# Patient Record
Sex: Male | Born: 1955
Health system: Southern US, Community
[De-identification: ages and names within clinical notes are randomized; demographics above are authoritative.]

## PROBLEM LIST (undated history)

## (undated) DIAGNOSIS — M1990 Unspecified osteoarthritis, unspecified site: Secondary | ICD-10-CM

## (undated) DIAGNOSIS — E785 Hyperlipidemia, unspecified: Secondary | ICD-10-CM

## (undated) DIAGNOSIS — Z87442 Personal history of urinary calculi: Secondary | ICD-10-CM

## (undated) DIAGNOSIS — I10 Essential (primary) hypertension: Secondary | ICD-10-CM

## (undated) DIAGNOSIS — N200 Calculus of kidney: Secondary | ICD-10-CM

## (undated) DIAGNOSIS — K219 Gastro-esophageal reflux disease without esophagitis: Secondary | ICD-10-CM

## (undated) HISTORY — PX: BACK SURGERY: SHX140

## (undated) HISTORY — PX: SHOULDER SURGERY: SHX246

## (undated) HISTORY — DX: Hyperlipidemia, unspecified: E78.5

## (undated) HISTORY — PX: JOINT REPLACEMENT: SHX530

## (undated) HISTORY — PX: SPINE SURGERY: SHX786

---

## 1982-09-05 HISTORY — PX: LAMINECTOMY: SHX219

## 1994-09-05 HISTORY — PX: LAMINECTOMY: SHX219

## 1998-10-22 ENCOUNTER — Ambulatory Visit (HOSPITAL_COMMUNITY): Admission: RE | Admit: 1998-10-22 | Discharge: 1998-10-22 | Payer: Self-pay | Admitting: Orthopedic Surgery

## 1998-10-22 ENCOUNTER — Encounter: Payer: Self-pay | Admitting: Orthopedic Surgery

## 1999-09-30 ENCOUNTER — Encounter: Payer: Self-pay | Admitting: Orthopedic Surgery

## 1999-09-30 ENCOUNTER — Ambulatory Visit (HOSPITAL_COMMUNITY): Admission: RE | Admit: 1999-09-30 | Discharge: 1999-09-30 | Payer: Self-pay | Admitting: Orthopedic Surgery

## 2001-06-19 ENCOUNTER — Encounter: Payer: Self-pay | Admitting: Sports Medicine

## 2001-06-19 ENCOUNTER — Encounter: Admission: RE | Admit: 2001-06-19 | Discharge: 2001-06-19 | Payer: Self-pay | Admitting: Sports Medicine

## 2001-12-31 ENCOUNTER — Encounter: Payer: Self-pay | Admitting: Sports Medicine

## 2001-12-31 ENCOUNTER — Encounter: Admission: RE | Admit: 2001-12-31 | Discharge: 2001-12-31 | Payer: Self-pay | Admitting: Sports Medicine

## 2002-01-10 ENCOUNTER — Encounter: Payer: Self-pay | Admitting: Sports Medicine

## 2002-01-10 ENCOUNTER — Encounter: Admission: RE | Admit: 2002-01-10 | Discharge: 2002-01-10 | Payer: Self-pay | Admitting: Sports Medicine

## 2002-03-19 ENCOUNTER — Encounter: Admission: RE | Admit: 2002-03-19 | Discharge: 2002-03-19 | Payer: Self-pay | Admitting: Neurosurgery

## 2002-03-19 ENCOUNTER — Encounter: Payer: Self-pay | Admitting: Neurosurgery

## 2002-10-24 ENCOUNTER — Encounter: Payer: Self-pay | Admitting: Orthopedic Surgery

## 2002-10-24 ENCOUNTER — Encounter: Admission: RE | Admit: 2002-10-24 | Discharge: 2002-10-24 | Payer: Self-pay | Admitting: Orthopedic Surgery

## 2006-09-28 ENCOUNTER — Inpatient Hospital Stay (HOSPITAL_COMMUNITY): Admission: RE | Admit: 2006-09-28 | Discharge: 2006-10-01 | Payer: Self-pay | Admitting: Orthopedic Surgery

## 2006-10-12 ENCOUNTER — Inpatient Hospital Stay (HOSPITAL_COMMUNITY): Admission: EM | Admit: 2006-10-12 | Discharge: 2006-10-13 | Payer: Self-pay | Admitting: Emergency Medicine

## 2006-10-12 ENCOUNTER — Ambulatory Visit: Payer: Self-pay | Admitting: Internal Medicine

## 2009-02-12 ENCOUNTER — Ambulatory Visit (HOSPITAL_COMMUNITY): Admission: RE | Admit: 2009-02-12 | Discharge: 2009-02-12 | Payer: Self-pay | Admitting: Urology

## 2009-06-03 ENCOUNTER — Encounter: Admission: RE | Admit: 2009-06-03 | Discharge: 2009-06-03 | Payer: Self-pay | Admitting: Neurosurgery

## 2011-01-21 NOTE — Discharge Summary (Signed)
NAME:  Kyle Lawson, NATZKE NO.:  [id number]   MEDICAL RECORD NO.:  [id number]          PATIENT TYPE:  INP   LOCATION:  5016                         FACILITY:  [facility]   PHYSICIAN:  Loreta Ave, M.D. DATE OF BIRTH:  1956/02/15   DATE OF ADMISSION:  09/28/2006  DATE OF DISCHARGE:  10/01/2006                               DISCHARGE SUMMARY   FINAL DIAGNOSES:  1. Status post left total knee hip replacement for end-stage      degenerative joint disease.  2. Hypertension.  3. Gout.   HISTORY OF PRESENT ILLNESS:  A 55 year old white male with a history of  end-stage degenerative joint disease left hip and chronic pain,  presented to our office for a preop evaluation for a total hip  replacement.  He had progressive worsening pain, but failed conservative  treatment.  Significant decrease in his daily activities.   HOSPITAL COURSE:  September 28, 2006, patient was taken to the [facility]  OR and a left total knee replacement procedure performed.  Surgeon,  Loreta Ave, MD, assistant, Genene Churn. Barry Dienes, PA-C.  Anesthesia  general.  No specimens.  EBL minimal.  There were no surgical or  anesthesia complications and the patient was transferred to recovery in  a stable condition.  September 28, 2006, patient doing well.  Complained  of a right great toe pain from a gouty attack.  Temp 101.5, pulse 95,  respirations 18, blood pressure 94/59.  WBC 8.8, hemoglobin 11.4,  hematocrit 31.9, platelets 229, sodium 133, potassium 4.0, chloride 99,  CO2 28, BUN 14, creatinine 1.3, glucose 110, INR 1.4.  Dressing clean,  dry and intact.  Calf nontender and neurovascular intact.  Right great  toe marked tenderness to palpation with some swelling.  Discontinued  Foley.  Started on pharmacy protocol Coumadin.  Evaluated by PT/OT.  September 30, 2006, patient doing well.  Temp 99, pulse 98, respirations  20, blood pressure 114/65.  Hemoglobin 10.9, INR 1.4.  Wound looked  good, staples  intact.  No drainage or signs of infection.  October 01, 2006, patient doing well.  Right great toe feels better.  Vital signs  stable, afebrile.  Hemoglobin 10.5, hematocrit 30.4, WBC 9.3, platelets  215, sodium 134, potassium 4.4, chloride 102, CO2 26, BUN 17, creatinine  1.07, glucose 93.  Wound looked good, staples intact.  No drainage or  signs of infection.  Calf nontender and neurovascular intact.  Patient  states that he is ready to be discharged home.   CONDITION:  Good and stable.   DISPOSITION:  Discharge home.   DISCHARGE MEDICATIONS:  1. Percocet 7.5/325 one or two tabs p.o. q.4-6 hours p.r.n. for pain.  2. Robaxin 500 mg one tab p.o. q.6 hours p.r.n. for spasms.  3. Coumadin pharmacy protocol.  4. Resume previous home meds lisinopril and allopurinol.   INSTRUCTIONS:  Patient will work with home health PT/OT to improve range  of motion, strengthening and ambulation.  He is 50% partial  weightbearing left lower extremity.  Will follow up in the office two  weeks postop  for recheck.  Coumadin x4 weeks postoperative DVT  prophylaxis.      Genene Churn. Denton Meek.      Loreta Ave, M.D.  Electronically Signed    JMO/MEDQ  D:  02/05/2007  T:  02/05/2007  Job:  [id number]

## 2011-01-21 NOTE — Procedures (Signed)
EEG NUMBER:  [id number].   HISTORY OF PRESENT ILLNESS:  This is a 55 year old with blurry vision,  who is having an EEG done to evaluate for seizure activity.   PROCEDURE:  Routine EEG.   TECHNICAL DESCRIPTION:  Test is a routine EEG. There is a posterior  dominant rhythm at 9-10 Hz activity at 10 to 20 microvolts. Background  activity is symmetric and mostly comprised of slpha activity at 10 to 25  microvolts. With photic stimulation, there is a symmetric driving  response noted. Hyperventilation produces a symmetric, slowing response.  The patient does become drowsy; however, does not enter stage II sleep  throughout this record.  There is no evidence of electrocardiographic  seizures or interictal discharge activity.   IMPRESSION:  This routine electroencephalogram is within normal limits  in the awake state.      Bevelyn Buckles. Nash Shearer, M.D.  Electronically Signed     ZOX:WRUE  D:  10/13/2006 18:37:36  T:  10/14/2006 10:21:43  Job #:  [id number]

## 2011-01-21 NOTE — Op Note (Signed)
NAME:  Kyle Lawson, KLINGBEIL NO.:  [id number]   MEDICAL RECORD NO.:  [id number]          PATIENT TYPE:  INP   LOCATION:  2550                         FACILITY:  [facility]   PHYSICIAN:  Loreta Ave, M.D. DATE OF BIRTH:  19-Jan-1956   DATE OF PROCEDURE:  09/28/2006  DATE OF DISCHARGE:                               OPERATIVE REPORT   PREOPERATIVE DIAGNOSIS:  End-stage degenerative arthritis, left hip.   POSTOPERATIVE DIAGNOSIS:  End-stage degenerative arthritis, left hip.   PROCEDURE:  Left total hip replacement.  Stryker prosthesis.   FEMORAL COMPONENT:  Accolade, size 4.5, press-fit.  A 58-mm acetabular  component with 36-mm internal diameter ceramic liner.  Screw fixation  x2.  A 36-mm +0 ceramic femoral head.   SURGEON:  Loreta Ave, M.D.   ASSISTANT:  Genene Churn. Denton Meek.   ANESTHESIA:  General.   BLOOD LOSS:  200 mL.   BLOOD GIVEN:  None.   SPECIMENS:  None.   CULTURES:  None.   COMPLICATIONS:  None.   DRESSINGS:  Soft compressive with abduction pillow.   PROCEDURE:  The patient brought to the operating room and placed on the  operating room table in the supine position.  After adequate anesthesia  had been obtained, turned to the lateral position, left side up.  Prepped and draped in the usual sterile fashion.  Incision along the  shaft of the femur to the trochanter, extending posterior and superior.  Skin and subcutaneous tissue divided.  The iliotibial band exposed,  incised.  Charnley retractor put in place.  Neurovascular structures  identified and protected.  External rotator capsule taken down off the  back of the intertrochanteric groove on the femur, tied with FiberWire.  The hip dislocated.  Marked grade 4 change throughout.  Periarticular  screw was removed.  Femoral head removed one fingerbreadth above the  lesser trochanter with a saw.  Acetabulum exposed.  Remnants of labrum  removed.  Reaming up to good position and bleeding  bone.  Sized for a 58-  mm cup, which was placed at 45 degrees of abduction, 20 degrees of  anteversion.  Excellent capture and fixation augmented with 2 screws  through the cup.  A 36-mm internal diameter ceramic liner.  The femur  was then exposed and with the handheld reamers and rasps, enlarged up to  a size 4.5 component, which gave good capture and alignment, fixation,  and filling, restoring normal anteversion.  After appropriate trialing,  4.5 was seated in the femur.  The +0 x 36-mm head attached.  Hip  reduced.  Excellent restoration of leg lengths.  Good alignment.  Good  stability in flexion and extension.  Wound irrigated.  External rotator  and capsule were repaired through drill holes.  Suture tied over a bony  bridge.  Charnley retractor removed.  The wound was closed with #1  Vicryl, and skin and subcutaneous tissue with Vicryl and staples.  Sterile compression dressing applied after Marcaine injection of the  wound.  Returned to the supine position.  Abduction pillow placed.  Anesthesia reversed.  Brought to  recovery room.  Tolerated surgery well  with no complications.      Loreta Ave, M.D.  Electronically Signed     DFM/MEDQ  D:  09/28/2006  T:  09/28/2006  Job:  [id number]

## 2011-01-21 NOTE — H&P (Signed)
NAME:  MELECIO, CUETO NO.:  [id number]   MEDICAL RECORD NO.:  [id number]          PATIENT TYPE:  INP   LOCATION:  6733                         FACILITY:  [facility]   PHYSICIAN:  Genene Churn. Love, M.D.    DATE OF BIRTH:  1956/06/16   DATE OF ADMISSION:  10/12/2006  DATE OF DISCHARGE:                              HISTORY & PHYSICAL   PATIENT'S ADDRESS:  [address]  [place], [place] [address]   RADIOLOGIC FINDINGS:  This is the 2nd [facility] admission for  this 55 year old right-handed white married male admitted for 3 episodes  of blurred vision.   HISTORY OF PRESENT ILLNESS:  Mr. Sciandra has a 15-year history of  hypertension and 2 weeks ago, underwent total left hip replacement for  degenerative disease of the hip and has been followed by Dr. Richardson Landry  and underwent surgery by Dr. Richardson Landry.  The surgery went well on  September 28, 2006 and he was placed on Coumadin therapy thereafter.  Yesterday, he had 2 episodes lasting 5 minutes of wavy vision in his  right visual field, unassociated with definite headache.  He had a  similar episode today, called Dr. Greig Right office and was sent to the  emergency room for further evaluation.  He has a long history of  hypertension for about 15 years.  He has no known history of migraine.  He has no known history of seizures.  CT scan of the brain in the  emergency room and EKG were normal.   PAST MEDICAL HISTORY:  1. Left hip replacement, September 28, 2006, for DJD.  2. Lumbar spine surgery x2, once in 1984 and once again in 1996.  3. Hypertension for 15 years.  4. Gout.   SOCIAL HISTORY:  He does not smoke cigarettes.  He does drink 1 drink of  alcohol per day.  He has a Ph.D. Animal nutritionist.  He is married.   FAMILY HISTORY:  His mother is 84, living and well.  His father is 60,  living and well.  He has sisters, 49 and 38, living and well.  He has  brothers, 92, 81 and 45, living and well.  One brother  committed suicide  at age 65.  He has a brother, 44, with diabetes mellitus.   PHYSICAL EXAMINATION:  GENERAL:  A well-developed, pleasant white male.  VITAL SIGNS:  Blood pressure in right and left arm 120/60.  Heart rate  was 84.  NECK:  There were no bruits.  NEUROLOGIC:  He was alert and oriented x3.  His cranial nerve  examination revealed visual fields full, disks flat, extraocular  movements full, corneals present, no 7th nerve palsy, tongue midline,  uvula midline, gags present, sternocleidomastoid and trapezius testing  normal.  Motor examination revealed 5/5 strength proximally and distally  in the upper and lower extremities, no evidence of proximal pronator or  distal drift.  Coordination testing normal.  Sensory examination intact  to pinprick, light touch, joint position and vibration testing.  Deep  tendon reflexes were 1 to 2+.  Plantar responses downgoing.  Gait  examination not performed.  LUNGS:  Clear.  EARS:  Tympanic membranes were clear.  HEART:  Without murmurs.  ABDOMEN:  Bowel sounds were normal.   IMPRESSION:  1. Blurred vision, rule out left parietal stroke, code 368.8.  2. Hypertension, code 796.2.  3. Low back pain, code 724.4.  4. Status post left hip replacement, code 719.45.   PLAN:  To admit the patient for MRI, MRA, intracranial, 2-D echo and  Doppler studies.           ______________________________  Genene Churn. Sandria Manly, M.D.     JML/MEDQ  D:  10/12/2006  T:  10/13/2006  Job:  [id number]   cc:   Loreta Ave, M.D.

## 2011-01-21 NOTE — Discharge Summary (Signed)
NAME:  Kyle Lawson, Kyle Lawson NO.:  [id number]   MEDICAL RECORD NO.:  [id number]          PATIENT TYPE:  INP   LOCATION:  6733                         FACILITY:  [facility]   PHYSICIAN:  Genene Churn. Love, M.D.    DATE OF BIRTH:  15-Jun-1956   DATE OF ADMISSION:  10/12/2006  DATE OF DISCHARGE:  10/13/2006                               DISCHARGE SUMMARY   DIAGNOSES AT TIME OF DISCHARGE:  1. Blurred vision, etiology uncertain, no acute stroke.  2. Hypertension.  3. Low back pain, status post left hip replacement 2 weeks ago.   MEDICINES AT TIME OF DISCHARGE:  1. Lisinopril 40 mg a day.  2. Allopurinol 300 mg a day.  3. Coumadin 7.5 mg a day.  4. Hydrocodone 10/325 mg p.o. q.4-6h. p.r.n.   STUDIES PERFORMED:  1. CT of the brain on admission shows no acute abnormality.  2. MRI shows no acute stroke.  3. MRA shows no significant stenosis.  4. Carotid Doppler shows no ICA stenosis or __________  flow      antegrade.  5. 2D Echocardiogram was performed, results pending.  6. EEG was performed, results pending.   LABORATORY STUDIES:  CBC with hemoglobin 12.5, hematocrit 36.6, white  blood cell 10.6, platelets 863, MCV 95.5 and neutrophils 77.  Chemistry  with glucose 101, otherwise normal.  Coagulations with INR 1.9, PTT 41,  PT 22.3.  AST 43, ALT 89, alkaline phosphatase 104 and total bilirubin  0.4.  Cardiac enzymes negative.  Cholesterol 173, HDL 23, triglycerides  401.  LDL unable to be calculated.  Urinalysis was negative.  Homocysteine pending.  Urine drug screen positive for opiates.  Alcohol  less than 5.  Hemoglobin A1c 5.5.   HISTORY OF PRESENT ILLNESS:  [name] is a 55 year old, right-  handed, Caucasian male who was admitted for 3 episodes of blurred  vision.  He has a 15-year history of hypertension and 2 weeks ago  underwent total left hip replacement for degenerative disease of the hip  and has been followed by Dr. Richardson Landry.  The surgery went well and  on  September 28, 2006 he was placed on Coumadin therapy, thereafter.  The day  prior to admission he had 2 episodes lasting 5 minutes of wavy vision in  his right visual field and associated with a definite headache.  He had  a similar episode the day of admission, called Dr. Greig Right office, and  was sent to the emergency room for further evaluation.  He has no known  history of migraine or seizures.  CT of the brain showed no acute  abnormality.  EKG was normal.  He was admitted to the hospital for  further circ evaluation.   HOSPITAL COURSE:  MRI was negative for acute stroke.  He had vascular  risk factors of hypertension and recent surgery, though he is  therapeutic on Coumadin. The patient had no further neurologic deficits  in hospital and was back to baseline.  He has an appointment for  ophthalmology follow up at the time of discharge which is recommended by  Dr. Sandria Manly.  He will continue on Coumadin and follow up with Dr. Eulah Pont as  prior to admission for Coumadin management.   CONDITION ON DISCHARGE:  Patient alert and oriented x3.  Speech clear.  __________  Strength normal except for his left hip which has had recent  surgery and he walks with a walker which is very steady.  His visual  fields are full.  His extraocular movements are intact.  Face is  symmetric.   DISCHARGE PLAN:  1. Discharge home with family.  2. Continue treatment as prior to admission.  3. Follow up with ophthalmologist as scheduled.  4. Follow up with Dr. Richardson Landry for Coumadin and orthopedic follow      up.  5. Follow up with Dr. Sandria Manly in 1-2 months as a hospital follow up.      Annie Main, N.P.    ______________________________  Genene Churn. Sandria Manly, M.D.    SB/MEDQ  D:  10/13/2006  T:  10/14/2006  Job:  [id number]   cc:   Loreta Ave, M.D.  [employer]  Pramod P. Pearlean Brownie, MD

## 2011-09-09 NOTE — Pre-Procedure Instructions (Signed)
20 Kyle Lawson  09/09/2011   Your procedure is scheduled on:  Wednesday, January 9 th  Report to [facility] at  6:30 AM.  Call this number if you have problems the morning of surgery: [phone]   Remember: Bring Incentive Spirometer back in suitcase.   Do not eat food:After Midnight  Tuesday.  May have clear liquids: up to 4 Hours before arrival time  (2:30 AM).  Clear liquids include soda, tea, black coffee, apple or grape juice, broth.   Take these medicines the morning of surgery with A SIP OF WATER: Colchicine, Allopurinol, Vicodin   Do not wear jewelry, make-up or nail polish.  Do not wear lotions, powders, or perfumes. You may wear deodorant.  Do not shave 48 hours prior to surgery.   Do not bring valuables to the hospital.   Contacts, dentures or bridgework may not be worn into surgery.  Leave suitcase in the car. After surgery it may be brought to your room.  For patients admitted to the hospital, checkout time is 11:00 AM the day of discharge.   Patients discharged the day of surgery will not be allowed to drive home.  Name and phone number of your driver: Patient will be admitted.  Special Instructions: CHG Shower Use Special Wash: 1/2 bottle night before surgery and 1/2 bottle morning of surgery.   Please read over the following fact sheets that you were given: Pain Booklet, MRSA Information and Surgical Site Infection Prevention

## 2011-09-12 ENCOUNTER — Encounter (HOSPITAL_COMMUNITY)
Admission: RE | Admit: 2011-09-12 | Discharge: 2011-09-12 | Disposition: A | Discharge status: Home / Self Care | Payer: BC Managed Care – PPO | Source: Ambulatory Visit | Attending: Orthopedic Surgery | Admitting: Orthopedic Surgery

## 2011-09-12 ENCOUNTER — Encounter (HOSPITAL_COMMUNITY)
Admission: RE | Admit: 2011-09-12 | Discharge: 2011-09-12 | Disposition: A | Discharge status: Home / Self Care | Payer: BC Managed Care – PPO | Source: Ambulatory Visit | Attending: Surgery | Admitting: Surgery

## 2011-09-12 ENCOUNTER — Other Ambulatory Visit: Payer: Self-pay

## 2011-09-12 ENCOUNTER — Encounter (HOSPITAL_COMMUNITY): Payer: Self-pay | Admitting: Pharmacy Technician

## 2011-09-12 ENCOUNTER — Encounter (HOSPITAL_COMMUNITY): Payer: Self-pay

## 2011-09-12 HISTORY — DX: Calculus of kidney: N20.0

## 2011-09-12 HISTORY — DX: Essential (primary) hypertension: I10

## 2011-09-12 HISTORY — DX: Gastro-esophageal reflux disease without esophagitis: K21.9

## 2011-09-12 LAB — PROTIME-INR: INR: 0.97 (ref 0.00–1.49)

## 2011-09-12 LAB — COMPREHENSIVE METABOLIC PANEL
AST: 20 U/L (ref 0–37)
Albumin: 3.8 g/dL (ref 3.5–5.2)
Calcium: 9.6 mg/dL (ref 8.4–10.5)
Creatinine, Ser: 0.97 mg/dL (ref 0.50–1.35)
GFR calc non Af Amer: >90 mL/min (ref >90)

## 2011-09-12 LAB — CBC
MCH: 33 pg (ref 26.0–34.0)
MCV: 96.9 fL (ref 78.0–100.0)
Platelets: 258 10*3/uL (ref 150–400)
RDW: 12 % (ref 11.5–15.5)

## 2011-09-12 LAB — URINALYSIS, ROUTINE W REFLEX MICROSCOPIC
Ketones, ur: NEGATIVE mg/dL
Leukocytes, UA: NEGATIVE
Nitrite: NEGATIVE
Specific Gravity, Urine: 1.022 (ref 1.005–1.030)
Urobilinogen, UA: 0.2 mg/dL (ref 0.0–1.0)
pH: 5.5 (ref 5.0–8.0)

## 2011-09-12 LAB — TYPE AND SCREEN

## 2011-09-12 LAB — SURGICAL PCR SCREEN
MRSA, PCR: NEGATIVE
Staphylococcus aureus: POSITIVE — AB

## 2011-09-12 MED ORDER — CHLORHEXIDINE GLUCONATE 4 % EX LIQD: 60.0000 mL | Freq: Once | CUTANEOUS | Status: DC

## 2011-09-12 NOTE — Pre-Procedure Instructions (Signed)
20 Kyle Lawson  09/12/2011   Your procedure is scheduled on:  09/14/2011  Report to [facility] at 0630 AM.  Call this number if you have problems the morning of surgery: [phone]   Remember:   Do not eat food:After Midnight.  May have clear liquids: up to 4 Hours before arrival.  Clear liquids include soda, tea, black coffee, apple or grape juice, broth.  Take these medicines the morning of surgery with A SIP OF WATER: Allopurinol, Colchicine, Vicodin   Do not wear jewelry, make-up or nail polish.  Do not wear lotions, powders, or perfumes. You may wear deodorant.  Do not shave 48 hours prior to surgery.  Do not bring valuables to the hospital.  Contacts, dentures or bridgework may not be worn into surgery.  Leave suitcase in the car. After surgery it may be brought to your room.  For patients admitted to the hospital, checkout time is 11:00 AM the day of discharge.   Patients discharged the day of surgery will not be allowed to drive home.  Name and phone number of your driver: Patient  Special Instructions: CHG Shower Use Special Wash: 1/2 bottle night before surgery and 1/2 bottle morning of surgery.   Please read over the following fact sheets that you were given: Pain Booklet, Coughing and Deep Breathing, Blood Transfusion Information, MRSA Information and Surgical Site Infection Prevention

## 2011-09-13 MED ORDER — CEFAZOLIN SODIUM-DEXTROSE 2-3 GM-% IV SOLR
2.0000 g | INTRAVENOUS | Status: AC
  Administered 2011-09-14: 2 g via INTRAVENOUS
  Filled 2011-09-13: qty 50

## 2011-09-13 NOTE — H&P (Signed)
  [facility] [address]   [address] [place], [place] [address] [phone] A Division of [facility]  Loreta Ave, M.D.     Robert A. Thurston Hole, M.D.     Lunette Stands, M.D. Eulas Post, M.D.    Buford Dresser, M.D. Estell Harpin, M.D. Ralene Cork, D.O.          Genene Churn. Barry Dienes, PA-C            Kirstin A. Shepperson, PA-C [place], OPA-C   RE: Kyle Lawson, Kyle Lawson                                [id number]      DOB: [birth year] PROGRESS NOTE: 09-09-11 Chief complaint: Right hip pain.  History of present illness: 68 five year-old white male with a history of end stage DJD, right hip, and chronic pain.  Returns.  States that hip symptoms unchanged from previous visit.  He is wanting to proceed with total hip replacement as scheduled.  Current medications: Lisinopril, Allopurinol, Colchicine and Simvastatin. Allergies: No known drug allergies. Past medical/surgical history: Left total hip replacement in 2008, lumbar laminectomy x 2 in 1984 and 1996, hypertension, gout and hypercholesterolemia.  Review of systems: Patient denies fevers, chills, lightheadedness, dizziness, chest pain, shortness of breath, abdominal pain or GU issues.   Family history: Positive for diabetes, hypertension, stroke, cancer and coronary artery disease. Social history: Denies smoking, admits occasional alcohol use.  He is married.      EXAMINATION: Temperature: 98.3  Pulse: 74.  Blood pressure: 132/85.  Respirations: 12 and unlabored.  Height: 5?9.  Weight: 194 pounds.  Pleasant white male, alert and oriented x 3 and in no acute distress.  Head is normocephalic, a traumatic.  PERRLA, EOMI.  Cervical spine unremarkable.  Lungs: CTA bilaterally.  No wheezes.  Heart: RRR.  S1 and S2.  No murmurs.  Abdomen: Round and non-distended.  NBS x 4.  Soft and non-tender.  Right hip: Positive log roll with decreased range of motion.  Neurovascularly  intact.  Skin warm and dry.   IMPRESSION: End stage DJD, right hip, and chronic pain.  Failed conservative treatment.   PLAN:  We will proceed with right total hip replacement as scheduled.  Surgical procedure, along with potential rehab/recovery time discussed.  All questions answered.    Loreta Ave, M.D.   Electronically verified by Loreta Ave, M.D. DFM(JMO):jjh D 09-12-11 T 09-12-11

## 2011-09-14 ENCOUNTER — Ambulatory Visit (HOSPITAL_COMMUNITY): Payer: BC Managed Care – PPO | Admitting: Anesthesiology

## 2011-09-14 ENCOUNTER — Encounter (HOSPITAL_COMMUNITY)
Admission: RE | Disposition: A | Discharge status: Home / Self Care | Payer: Self-pay | Source: Ambulatory Visit | Attending: Orthopedic Surgery

## 2011-09-14 ENCOUNTER — Encounter (HOSPITAL_COMMUNITY): Payer: Self-pay | Admitting: Anesthesiology

## 2011-09-14 ENCOUNTER — Encounter (HOSPITAL_COMMUNITY): Payer: Self-pay

## 2011-09-14 ENCOUNTER — Encounter (HOSPITAL_COMMUNITY): Payer: Self-pay | Admitting: *Deleted

## 2011-09-14 ENCOUNTER — Inpatient Hospital Stay (HOSPITAL_COMMUNITY)
Admission: RE | Admit: 2011-09-14 | Discharge: 2011-09-16 | DRG: 818 | Disposition: A | Discharge status: Home / Self Care | Payer: BC Managed Care – PPO | Source: Ambulatory Visit | Attending: Orthopedic Surgery | Admitting: Orthopedic Surgery

## 2011-09-14 DIAGNOSIS — M169 Osteoarthritis of hip, unspecified: Principal | ICD-10-CM | POA: Diagnosis present

## 2011-09-14 DIAGNOSIS — E871 Hypo-osmolality and hyponatremia: Secondary | ICD-10-CM | POA: Diagnosis not present

## 2011-09-14 DIAGNOSIS — E7800 Pure hypercholesterolemia, unspecified: Secondary | ICD-10-CM | POA: Diagnosis present

## 2011-09-14 DIAGNOSIS — M129 Arthropathy, unspecified: Secondary | ICD-10-CM | POA: Diagnosis present

## 2011-09-14 DIAGNOSIS — Z96649 Presence of unspecified artificial hip joint: Secondary | ICD-10-CM

## 2011-09-14 DIAGNOSIS — I10 Essential (primary) hypertension: Secondary | ICD-10-CM | POA: Diagnosis present

## 2011-09-14 DIAGNOSIS — K219 Gastro-esophageal reflux disease without esophagitis: Secondary | ICD-10-CM | POA: Diagnosis present

## 2011-09-14 DIAGNOSIS — G8929 Other chronic pain: Secondary | ICD-10-CM | POA: Diagnosis present

## 2011-09-14 DIAGNOSIS — Z01810 Encounter for preprocedural cardiovascular examination: Secondary | ICD-10-CM

## 2011-09-14 DIAGNOSIS — Z01818 Encounter for other preprocedural examination: Secondary | ICD-10-CM

## 2011-09-14 DIAGNOSIS — Z01812 Encounter for preprocedural laboratory examination: Secondary | ICD-10-CM

## 2011-09-14 DIAGNOSIS — Z471 Aftercare following joint replacement surgery: Secondary | ICD-10-CM

## 2011-09-14 DIAGNOSIS — M1610 Unilateral primary osteoarthritis, unspecified hip: Principal | ICD-10-CM | POA: Diagnosis present

## 2011-09-14 DIAGNOSIS — M109 Gout, unspecified: Secondary | ICD-10-CM | POA: Diagnosis present

## 2011-09-14 HISTORY — PX: TOTAL HIP ARTHROPLASTY: SHX124

## 2011-09-14 HISTORY — DX: Unspecified osteoarthritis, unspecified site: M19.90

## 2011-09-14 SURGERY — ARTHROPLASTY, HIP, TOTAL,POSTERIOR APPROACH
Anesthesia: General | Site: Hip | Laterality: Right | Wound class: Clean

## 2011-09-14 MED ORDER — MENTHOL 3 MG MT LOZG: 1.0000 | LOZENGE | OROMUCOSAL | Status: DC | PRN

## 2011-09-14 MED ORDER — NALOXONE HCL 0.4 MG/ML IJ SOLN
0.4000 mg | INTRAMUSCULAR | Status: DC | PRN
  Filled 2011-09-14: qty 1

## 2011-09-14 MED ORDER — MIDAZOLAM HCL 5 MG/5ML IJ SOLN
INTRAMUSCULAR | Status: DC | PRN
  Administered 2011-09-14: 2 mg via INTRAVENOUS

## 2011-09-14 MED ORDER — ALLOPURINOL 300 MG PO TABS
300.0000 mg | ORAL_TABLET | Freq: Every day | ORAL | Status: DC
  Administered 2011-09-15 – 2011-09-16 (×2): 300 mg via ORAL
  Filled 2011-09-14 (×2): qty 1

## 2011-09-14 MED ORDER — COUMADIN BOOK
Freq: Once | Status: AC
  Administered 2011-09-14: 16:00:00
  Filled 2011-09-14: qty 1

## 2011-09-14 MED ORDER — SIMVASTATIN 20 MG PO TABS
20.0000 mg | ORAL_TABLET | Freq: Every day | ORAL | Status: DC
  Administered 2011-09-14 – 2011-09-15 (×2): 20 mg via ORAL
  Filled 2011-09-14 (×3): qty 1

## 2011-09-14 MED ORDER — MORPHINE SULFATE (PF) 1 MG/ML IV SOLN
INTRAVENOUS | Status: DC
  Administered 2011-09-14: 7.5 mg via INTRAVENOUS
  Administered 2011-09-14: 12:00:00 via INTRAVENOUS
  Administered 2011-09-14: 12 mg via INTRAVENOUS
  Administered 2011-09-15: 13.5 mg via INTRAVENOUS
  Administered 2011-09-15: 18 mg via INTRAVENOUS
  Administered 2011-09-15: 10.38 mg via INTRAVENOUS
  Filled 2011-09-14 (×3): qty 25

## 2011-09-14 MED ORDER — POTASSIUM CHLORIDE IN NACL 20-0.9 MEQ/L-% IV SOLN
INTRAVENOUS | Status: DC
  Administered 2011-09-14: 1000 mL via INTRAVENOUS
  Administered 2011-09-15: 04:00:00 via INTRAVENOUS
  Filled 2011-09-14 (×9): qty 1000

## 2011-09-14 MED ORDER — FENTANYL CITRATE 0.05 MG/ML IJ SOLN
INTRAMUSCULAR | Status: DC | PRN
  Administered 2011-09-14: 50 ug via INTRAVENOUS
  Administered 2011-09-14: 25 ug via INTRAVENOUS
  Administered 2011-09-14: 100 ug via INTRAVENOUS
  Administered 2011-09-14 (×2): 50 ug via INTRAVENOUS
  Administered 2011-09-14: 25 ug via INTRAVENOUS
  Administered 2011-09-14: 100 ug via INTRAVENOUS

## 2011-09-14 MED ORDER — ONDANSETRON HCL 4 MG/2ML IJ SOLN
4.0000 mg | Freq: Four times a day (QID) | INTRAMUSCULAR | Status: DC | PRN
  Filled 2011-09-14: qty 2

## 2011-09-14 MED ORDER — ENOXAPARIN SODIUM 40 MG/0.4ML ~~LOC~~ SOLN
40.0000 mg | SUBCUTANEOUS | Status: DC
  Administered 2011-09-14 – 2011-09-15 (×2): 40 mg via SUBCUTANEOUS
  Filled 2011-09-14 (×3): qty 0.4

## 2011-09-14 MED ORDER — ONDANSETRON HCL 4 MG PO TABS: 4.0000 mg | ORAL_TABLET | Freq: Four times a day (QID) | ORAL | Status: DC | PRN

## 2011-09-14 MED ORDER — HYDROMORPHONE HCL PF 1 MG/ML IJ SOLN
0.2500 mg | INTRAMUSCULAR | Status: DC | PRN
  Administered 2011-09-14 (×4): 0.5 mg via INTRAVENOUS

## 2011-09-14 MED ORDER — FLEET ENEMA 7-19 GM/118ML RE ENEM: 1.0000 | ENEMA | Freq: Once | RECTAL | Status: AC | PRN

## 2011-09-14 MED ORDER — MEPERIDINE HCL 25 MG/ML IJ SOLN: 6.2500 mg | INTRAMUSCULAR | Status: DC | PRN

## 2011-09-14 MED ORDER — LIDOCAINE HCL (CARDIAC) 20 MG/ML IV SOLN
INTRAVENOUS | Status: DC | PRN
  Administered 2011-09-14: 60 mg via INTRAVENOUS

## 2011-09-14 MED ORDER — CEFAZOLIN SODIUM 1-5 GM-% IV SOLN
1.0000 g | Freq: Three times a day (TID) | INTRAVENOUS | Status: AC
  Administered 2011-09-14 – 2011-09-15 (×3): 1 g via INTRAVENOUS
  Filled 2011-09-14 (×3): qty 50

## 2011-09-14 MED ORDER — ACETAMINOPHEN 650 MG RE SUPP: 650.0000 mg | Freq: Four times a day (QID) | RECTAL | Status: DC | PRN

## 2011-09-14 MED ORDER — METHOCARBAMOL 100 MG/ML IJ SOLN
500.0000 mg | INTRAVENOUS | Status: AC
  Administered 2011-09-14: 500 mg via INTRAVENOUS
  Filled 2011-09-14: qty 5

## 2011-09-14 MED ORDER — ROCURONIUM BROMIDE 100 MG/10ML IV SOLN
INTRAVENOUS | Status: DC | PRN
  Administered 2011-09-14: 50 mg via INTRAVENOUS

## 2011-09-14 MED ORDER — PHENOL 1.4 % MT LIQD
1.0000 | OROMUCOSAL | Status: DC | PRN
  Filled 2011-09-14: qty 177

## 2011-09-14 MED ORDER — KETOROLAC TROMETHAMINE 30 MG/ML IJ SOLN
INTRAMUSCULAR | Status: AC
  Filled 2011-09-14: qty 1

## 2011-09-14 MED ORDER — DOCUSATE SODIUM 100 MG PO CAPS
100.0000 mg | ORAL_CAPSULE | Freq: Two times a day (BID) | ORAL | Status: DC
  Administered 2011-09-14 – 2011-09-16 (×4): 100 mg via ORAL
  Filled 2011-09-14 (×5): qty 1

## 2011-09-14 MED ORDER — ACETAMINOPHEN 325 MG PO TABS: 650.0000 mg | ORAL_TABLET | Freq: Four times a day (QID) | ORAL | Status: DC | PRN

## 2011-09-14 MED ORDER — SENNOSIDES-DOCUSATE SODIUM 8.6-50 MG PO TABS: 1.0000 | ORAL_TABLET | Freq: Every evening | ORAL | Status: DC | PRN

## 2011-09-14 MED ORDER — ONDANSETRON HCL 4 MG/2ML IJ SOLN: 4.0000 mg | Freq: Four times a day (QID) | INTRAMUSCULAR | Status: DC | PRN

## 2011-09-14 MED ORDER — METHOCARBAMOL 100 MG/ML IJ SOLN
500.0000 mg | Freq: Four times a day (QID) | INTRAVENOUS | Status: DC | PRN
  Administered 2011-09-14: 500 mg via INTRAVENOUS
  Filled 2011-09-14: qty 5

## 2011-09-14 MED ORDER — HYDROMORPHONE HCL PF 1 MG/ML IJ SOLN
INTRAMUSCULAR | Status: AC
  Filled 2011-09-14: qty 1

## 2011-09-14 MED ORDER — KETOROLAC TROMETHAMINE 30 MG/ML IJ SOLN
30.0000 mg | Freq: Once | INTRAMUSCULAR | Status: AC
  Administered 2011-09-14: 30 mg via INTRAVENOUS

## 2011-09-14 MED ORDER — ONDANSETRON HCL 4 MG/2ML IJ SOLN
INTRAMUSCULAR | Status: DC | PRN
  Administered 2011-09-14: 4 mg via INTRAVENOUS

## 2011-09-14 MED ORDER — PROMETHAZINE HCL 25 MG/ML IJ SOLN: 6.2500 mg | INTRAMUSCULAR | Status: DC | PRN

## 2011-09-14 MED ORDER — OXYCODONE-ACETAMINOPHEN 5-325 MG PO TABS
1.0000 | ORAL_TABLET | ORAL | Status: DC | PRN
  Administered 2011-09-15 – 2011-09-16 (×5): 2 via ORAL
  Filled 2011-09-14 (×5): qty 2

## 2011-09-14 MED ORDER — ACETAMINOPHEN 10 MG/ML IV SOLN
INTRAVENOUS | Status: AC
  Filled 2011-09-14: qty 100

## 2011-09-14 MED ORDER — NEOSTIGMINE METHYLSULFATE 1 MG/ML IJ SOLN
INTRAMUSCULAR | Status: DC | PRN
  Administered 2011-09-14: 2 mg via INTRAVENOUS

## 2011-09-14 MED ORDER — BUPIVACAINE HCL (PF) 0.25 % IJ SOLN
INTRAMUSCULAR | Status: DC | PRN
  Administered 2011-09-14: 20 mL

## 2011-09-14 MED ORDER — DIPHENHYDRAMINE HCL 12.5 MG/5ML PO ELIX
12.5000 mg | ORAL_SOLUTION | Freq: Four times a day (QID) | ORAL | Status: DC | PRN
  Filled 2011-09-14: qty 5

## 2011-09-14 MED ORDER — SODIUM CHLORIDE 0.9 % IJ SOLN: 9.0000 mL | INTRAMUSCULAR | Status: DC | PRN

## 2011-09-14 MED ORDER — METHOCARBAMOL 500 MG PO TABS
500.0000 mg | ORAL_TABLET | Freq: Four times a day (QID) | ORAL | Status: DC | PRN
  Administered 2011-09-14 – 2011-09-16 (×4): 500 mg via ORAL
  Filled 2011-09-14 (×5): qty 1

## 2011-09-14 MED ORDER — DIPHENHYDRAMINE HCL 50 MG/ML IJ SOLN
12.5000 mg | Freq: Four times a day (QID) | INTRAMUSCULAR | Status: DC | PRN
  Filled 2011-09-14: qty 0.25

## 2011-09-14 MED ORDER — PHENYLEPHRINE HCL 10 MG/ML IJ SOLN
INTRAMUSCULAR | Status: DC | PRN
  Administered 2011-09-14: 80 ug via INTRAVENOUS
  Administered 2011-09-14: 40 ug via INTRAVENOUS
  Administered 2011-09-14: 80 ug via INTRAVENOUS
  Administered 2011-09-14: 40 ug via INTRAVENOUS
  Administered 2011-09-14: 80 ug via INTRAVENOUS
  Administered 2011-09-14: 40 ug via INTRAVENOUS

## 2011-09-14 MED ORDER — ACETAMINOPHEN 10 MG/ML IV SOLN
INTRAVENOUS | Status: DC | PRN
  Administered 2011-09-14: 1000 mg via INTRAVENOUS

## 2011-09-14 MED ORDER — SODIUM CHLORIDE 0.9 % IR SOLN
Status: DC | PRN
  Administered 2011-09-14: 1000 mL

## 2011-09-14 MED ORDER — WARFARIN VIDEO
Freq: Once | Status: AC
  Administered 2011-09-15: 10:00:00

## 2011-09-14 MED ORDER — GLYCOPYRROLATE 0.2 MG/ML IJ SOLN
INTRAMUSCULAR | Status: DC | PRN
  Administered 2011-09-14 (×2): 0.2 mg via INTRAVENOUS

## 2011-09-14 MED ORDER — PROPOFOL 10 MG/ML IV EMUL
INTRAVENOUS | Status: DC | PRN
  Administered 2011-09-14: 200 mg via INTRAVENOUS

## 2011-09-14 MED ORDER — LISINOPRIL 40 MG PO TABS
40.0000 mg | ORAL_TABLET | Freq: Every day | ORAL | Status: DC
  Administered 2011-09-15: 40 mg via ORAL
  Filled 2011-09-14 (×2): qty 1

## 2011-09-14 MED ORDER — WARFARIN SODIUM 7.5 MG PO TABS
7.5000 mg | ORAL_TABLET | Freq: Once | ORAL | Status: AC
  Administered 2011-09-14: 7.5 mg via ORAL
  Filled 2011-09-14: qty 1

## 2011-09-14 MED ORDER — LACTATED RINGERS IV SOLN
INTRAVENOUS | Status: DC | PRN
  Administered 2011-09-14 (×2): via INTRAVENOUS

## 2011-09-14 SURGICAL SUPPLY — 67 items
127 neck angle hip stem ×1 IMPLANT
BLADE SAW SAG 73X25 THK (BLADE) ×1
BLADE SAW SGTL 73X25 THK (BLADE) ×1 IMPLANT
BOOTCOVER CLEANROOM LRG (PROTECTIVE WEAR) ×4 IMPLANT
BRUSH FEMORAL CANAL (MISCELLANEOUS) IMPLANT
CLOTH BEACON ORANGE TIMEOUT ST (SAFETY) ×2 IMPLANT
COVER BACK TABLE 24X17X13 BIG (DRAPES) IMPLANT
COVER SURGICAL LIGHT HANDLE (MISCELLANEOUS) ×2 IMPLANT
CUP TRIDENT PSL CLUS G 58 59.8 (Orthopedic Implant) ×1 IMPLANT
Ceramic V40 Femoral head ×1 IMPLANT
DRAPE INCISE IOBAN 66X45 STRL (DRAPES) IMPLANT
DRAPE ORTHO SPLIT 77X108 STRL (DRAPES) ×4
DRAPE SURG ORHT 6 SPLT 77X108 (DRAPES) ×2 IMPLANT
DRAPE U-SHAPE 47X51 STRL (DRAPES) ×2 IMPLANT
DRILL BIT 7/64X5 (BIT) ×2 IMPLANT
DRSG PAD ABDOMINAL 8X10 ST (GAUZE/BANDAGES/DRESSINGS) ×5 IMPLANT
DURAPREP 26ML APPLICATOR (WOUND CARE) ×2 IMPLANT
ELECT BLADE 6.5 EXT (BLADE) IMPLANT
ELECT CAUTERY BLADE 6.4 (BLADE) ×2 IMPLANT
ELECT REM PT RETURN 9FT ADLT (ELECTROSURGICAL) ×2
ELECTRODE REM PT RTRN 9FT ADLT (ELECTROSURGICAL) ×1 IMPLANT
EVACUATOR 1/8 PVC DRAIN (DRAIN) IMPLANT
FACESHIELD LNG OPTICON STERILE (SAFETY) ×4 IMPLANT
GAUZE SPONGE 4X4 12PLY STRL LF (GAUZE/BANDAGES/DRESSINGS) ×1 IMPLANT
GAUZE XEROFORM 5X9 LF (GAUZE/BANDAGES/DRESSINGS) ×2 IMPLANT
GLOVE BIOGEL PI IND STRL 8 (GLOVE) ×1 IMPLANT
GLOVE BIOGEL PI INDICATOR 8 (GLOVE) ×1
GLOVE ORTHO TXT STRL SZ7.5 (GLOVE) ×4 IMPLANT
GOWN PREVENTION PLUS XLARGE (GOWN DISPOSABLE) ×2 IMPLANT
GOWN PREVENTION PLUS XXLARGE (GOWN DISPOSABLE) ×2 IMPLANT
GOWN STRL NON-REIN LRG LVL3 (GOWN DISPOSABLE) ×2 IMPLANT
HANDPIECE INTERPULSE COAX TIP (DISPOSABLE)
INSERT TRIDENT 10DEG X3 36MM (Orthopedic Implant) ×1 IMPLANT
KIT BASIN OR (CUSTOM PROCEDURE TRAY) ×2 IMPLANT
KIT ROOM TURNOVER OR (KITS) ×2 IMPLANT
MANIFOLD NEPTUNE II (INSTRUMENTS) ×2 IMPLANT
NDL HYPO 25GX1X1/2 BEV (NEEDLE) ×1 IMPLANT
NEEDLE HYPO 25GX1X1/2 BEV (NEEDLE) ×2 IMPLANT
NS IRRIG 1000ML POUR BTL (IV SOLUTION) ×2 IMPLANT
PACK TOTAL JOINT (CUSTOM PROCEDURE TRAY) ×2 IMPLANT
PAD ARMBOARD 7.5X6 YLW CONV (MISCELLANEOUS) ×4 IMPLANT
PASSER SUT SWANSON 36MM LOOP (INSTRUMENTS) ×2 IMPLANT
PILLOW ABDUCTION HIP (SOFTGOODS) ×2 IMPLANT
PRESSURIZER FEMORAL UNIV (MISCELLANEOUS) IMPLANT
SCREW OSTEO 16MM (Screw) ×1 IMPLANT
SCREW OSTEO 20MM (Screw) ×1 IMPLANT
SET HNDPC FAN SPRY TIP SCT (DISPOSABLE) IMPLANT
SPONGE GAUZE 4X4 12PLY (GAUZE/BANDAGES/DRESSINGS) ×2 IMPLANT
SPONGE LAP 4X18 X RAY DECT (DISPOSABLE) IMPLANT
STAPLER VISISTAT 35W (STAPLE) ×2 IMPLANT
SUCTION FRAZIER TIP 10 FR DISP (SUCTIONS) ×2 IMPLANT
SUT FIBERWIRE #2 38 REV NDL BL (SUTURE) ×6
SUT VIC AB 1 CTX 36 (SUTURE) ×8
SUT VIC AB 1 CTX36XBRD ANBCTR (SUTURE) ×4 IMPLANT
SUT VIC AB 2-0 SH 27 (SUTURE) ×4
SUT VIC AB 2-0 SH 27XBRD (SUTURE) ×2 IMPLANT
SUT VIC AB 3-0 SH 27 (SUTURE)
SUT VIC AB 3-0 SH 27X BRD (SUTURE) IMPLANT
SUTURE FIBERWR#2 38 REV NDL BL (SUTURE) ×3 IMPLANT
SYR 30ML SLIP (SYRINGE) ×2 IMPLANT
SYR CONTROL 10ML LL (SYRINGE) ×2 IMPLANT
TAPE CLOTH SURG 6X10 WHT LF (GAUZE/BANDAGES/DRESSINGS) ×1 IMPLANT
TOWEL OR 17X24 6PK STRL BLUE (TOWEL DISPOSABLE) ×2 IMPLANT
TOWEL OR 17X26 10 PK STRL BLUE (TOWEL DISPOSABLE) ×2 IMPLANT
TOWER CARTRIDGE SMART MIX (DISPOSABLE) IMPLANT
TRAY FOLEY CATH 14FR (SET/KITS/TRAYS/PACK) ×2 IMPLANT
WATER STERILE IRR 1000ML POUR (IV SOLUTION) ×8 IMPLANT

## 2011-09-14 NOTE — Preoperative (Signed)
Beta Blockers   Reason not to administer Beta Blockers:Not Applicable 

## 2011-09-14 NOTE — Anesthesia Procedure Notes (Signed)
Procedure Name: Intubation Date/Time: 09/14/2011 8:43 AM Performed by: Caryn Bee Pre-anesthesia Checklist: Patient identified, Emergency Drugs available, Suction available, Patient being monitored and Timeout performed Patient Re-evaluated:Patient Re-evaluated prior to inductionOxygen Delivery Method: Circle System Utilized Preoxygenation: Pre-oxygenation with 100% oxygen Intubation Type: IV induction Ventilation: Mask ventilation without difficulty Laryngoscope Size: Mac and 4 Grade View: Grade I Tube type: Oral Tube size: 7.5 mm Number of attempts: 1 Airway Equipment and Method: stylet Placement Confirmation: ETT inserted through vocal cords under direct vision,  positive ETCO2 and breath sounds checked- equal and bilateral Secured at: 22 cm Tube secured with: Tape Dental Injury: Teeth and Oropharynx as per pre-operative assessment

## 2011-09-14 NOTE — Brief Op Note (Signed)
09/14/2011  10:57 AM  PATIENT:  Kyle Lawson  56 y.o. male  PRE-OPERATIVE DIAGNOSIS:  DJD RT HIP  POST-OPERATIVE DIAGNOSIS:  DJD RT HIP  PROCEDURE:  Procedure(s): Right TOTAL HIP ARTHROPLASTY  SURGEON:  Surgeon(s): Loreta Ave, MD  PHYSICIAN ASSISTANT: Zonia Kief M   ANESTHESIA:   general  EBL:  Total I/O In: 1600 [I.V.:1600] Out: 875 [Urine:475; Blood:400]  BLOOD ADMINISTERED:none  SPECIMEN:  No Specimen  DISPOSITION OF SPECIMEN:  N/A  COUNTS:  YES  TOURNIQUET:  * No tourniquets in log *   PATIENT DISPOSITION:  PACU - hemodynamically stable.

## 2011-09-14 NOTE — Interval H&P Note (Signed)
History and Physical Interval Note:  09/14/2011 8:35 AM  Kyle Lawson  has presented today for surgery, with the diagnosis of DJD RT HIP  The various methods of treatment have been discussed with the patient and family. After consideration of risks, benefits and other options for treatment, the patient has consented to  Procedure(s): TOTAL HIP ARTHROPLASTY as a surgical intervention .  The patients' history has been reviewed, patient examined, no change in status, stable for surgery.  I have reviewed the patients' chart and labs.  Questions were answered to the patient's satisfaction.     [name] F

## 2011-09-14 NOTE — Plan of Care (Signed)
Problem: Consults Goal: Diagnosis- Total Joint Replacement Outcome: Completed/Met Date Met:  09/14/11 Hemiarthroplasty

## 2011-09-14 NOTE — Anesthesia Postprocedure Evaluation (Signed)
  Anesthesia Post-op Note  Patient: Kyle Lawson  Procedure(s) Performed:  TOTAL HIP ARTHROPLASTY  Patient Location: PACU  Anesthesia Type: General  Level of Consciousness: awake  Airway and Oxygen Therapy: Patient Spontanous Breathing and Patient connected to nasal cannula oxygen  Post-op Pain: moderate  Post-op Assessment: Post-op Vital signs reviewed, Patient's Cardiovascular Status Stable, Respiratory Function Stable, Patent Airway and No signs of Nausea or vomiting  Post-op Vital Signs: Reviewed and stable  Complications: No apparent anesthesia complications

## 2011-09-14 NOTE — Progress Notes (Signed)
ANTICOAGULATION CONSULT NOTE - Initial Consult  Pharmacy Consult for Coumadin Indication: VTE px s/p R-THA  No Known Allergies  Patient Measurements:   Adjusted Body Weight: 192 lb  Vital Signs: Temp: 98.6 F (37 C) (01/09 1245) Temp src: Oral (01/09 0712) BP: 130/84 mmHg (01/09 1300) Pulse Rate: 79  (01/09 1300)  Labs:  Basename 09/12/11 0842  HGB 14.7  HCT 43.1  PLT 258  APTT 28  LABPROT 13.1  INR 0.97  HEPARINUNFRC --  CREATININE 0.97  CKTOTAL --  CKMB --  TROPONINI --   CrCl is unknown because there is no height on file for the current visit.  Medical History: Past Medical History  Diagnosis Date  . Kidney stone   . Hypertension   . GERD (gastroesophageal reflux disease)     occasional  . Gout   . Arthritis     Medications:  Scheduled:    . allopurinol  300 mg Oral Daily  . ceFAZolin (ANCEF) IV  1 g Intravenous Q8H  . ceFAZolin (ANCEF) IV  2 g Intravenous 60 min Pre-Op  . docusate sodium  100 mg Oral BID  . enoxaparin  40 mg Subcutaneous Q24H  . HYDROmorphone      . ketorolac  30 mg Intravenous Once  . ketorolac      . lisinopril  40 mg Oral Daily  . methocarbamol(ROBAXIN) IV  500 mg Intravenous To PACU  . morphine   Intravenous Q4H  . simvastatin  20 mg Oral q1800    Assessment: 56 yo male s/p R-THA, to start Coumadin for VTE px.  Baseline INR is 0.97.  Pt has no h/o bleeding problems.  Goal of Therapy:  INR 2-3   Plan:  1.  Coumadin 7.5mg  2.  Daily protime 3.  Coumadin book & video  Marisue Humble P 09/14/2011,2:56 PM

## 2011-09-14 NOTE — Anesthesia Preprocedure Evaluation (Signed)
Anesthesia Evaluation  Patient identified by MRN, date of birth, ID band Patient awake    Reviewed: Allergy & Precautions, H&P , NPO status , Patient's Chart, lab work & pertinent test results  Airway Mallampati: II TM Distance: >3 FB Neck ROM: Full    Dental No notable dental hx. (+) Teeth Intact   Pulmonary neg pulmonary ROS,  clear to auscultation  Pulmonary exam normal       Cardiovascular hypertension, On Medications neg cardio ROS Regular Normal    Neuro/Psych Negative Neurological ROS  Negative Psych ROS   GI/Hepatic negative GI ROS, Neg liver ROS, GERD-  Controlled,  Endo/Other  Negative Endocrine ROS  Renal/GU negative Renal ROS  Genitourinary negative   Musculoskeletal   Abdominal   Peds  Hematology negative hematology ROS (+)   Anesthesia Other Findings   Reproductive/Obstetrics negative OB ROS                           Anesthesia Physical Anesthesia Plan  ASA: II  Anesthesia Plan: General   Post-op Pain Management:    Induction: Intravenous  Airway Management Planned: Oral ETT  Additional Equipment:   Intra-op Plan:   Post-operative Plan: Extubation in OR  Informed Consent: I have reviewed the patients History and Physical, chart, labs and discussed the procedure including the risks, benefits and alternatives for the proposed anesthesia with the patient or authorized representative who has indicated his/her understanding and acceptance.     Plan Discussed with: CRNA and Surgeon  Anesthesia Plan Comments:         Anesthesia Quick Evaluation

## 2011-09-14 NOTE — Progress Notes (Signed)
Orthopedic Tech Progress Note Patient Details:  Kyle Lawson [birth year] [id number]      Nikki Dom 09/14/2011, 6:31 PM

## 2011-09-14 NOTE — Transfer of Care (Signed)
Immediate Anesthesia Transfer of Care Note  Patient: Kyle Lawson  Procedure(s) Performed:  TOTAL HIP ARTHROPLASTY  Patient Location: PACU  Anesthesia Type: General  Level of Consciousness: awake, alert  and oriented  Airway & Oxygen Therapy: Patient Spontanous Breathing and Patient connected to nasal cannula oxygen  Post-op Assessment: Report given to PACU RN and Post -op Vital signs reviewed and stable  Post vital signs: Reviewed and stable  Complications: No apparent anesthesia complications

## 2011-09-15 ENCOUNTER — Inpatient Hospital Stay (HOSPITAL_COMMUNITY): Payer: BC Managed Care – PPO

## 2011-09-15 ENCOUNTER — Encounter (HOSPITAL_COMMUNITY): Payer: Self-pay | Admitting: Orthopedic Surgery

## 2011-09-15 LAB — CBC
Hemoglobin: 10 g/dL — ABNORMAL LOW (ref 13.0–17.0)
MCH: 32.9 pg (ref 26.0–34.0)
Platelets: 197 10*3/uL (ref 150–400)
RBC: 3.04 MIL/uL — ABNORMAL LOW (ref 4.22–5.81)
WBC: 9.4 10*3/uL (ref 4.0–10.5)

## 2011-09-15 LAB — BASIC METABOLIC PANEL
CO2: 25 mEq/L (ref 19–32)
Chloride: 99 mEq/L (ref 96–112)
Glucose, Bld: 120 mg/dL — ABNORMAL HIGH (ref 70–99)
Sodium: 129 mEq/L — ABNORMAL LOW (ref 135–145)

## 2011-09-15 LAB — PROTIME-INR: Prothrombin Time: 15.8 seconds — ABNORMAL HIGH (ref 11.6–15.2)

## 2011-09-15 MED ORDER — WARFARIN SODIUM 7.5 MG PO TABS
7.5000 mg | ORAL_TABLET | Freq: Once | ORAL | Status: AC
  Administered 2011-09-15: 7.5 mg via ORAL
  Filled 2011-09-15: qty 1

## 2011-09-15 MED ORDER — SODIUM CHLORIDE 0.9 % IV BOLUS (SEPSIS)
500.0000 mL | Freq: Once | INTRAVENOUS | Status: AC
  Administered 2011-09-15: 500 mL via INTRAVENOUS

## 2011-09-15 NOTE — Progress Notes (Signed)
Physical Therapy Evaluation Patient Details Name: Kyle Lawson MRN: [id number] DOB: September 29, 1955 Today's Date: 09/15/2011  Problem List: There is no problem list on file for this patient.   Past Medical History:  Past Medical History  Diagnosis Date  . Kidney stone   . Hypertension   . GERD (gastroesophageal reflux disease)     occasional  . Gout   . Arthritis    Past Surgical History:  Past Surgical History  Procedure Date  . Joint replacement     left hip 09/2006  . Back surgery     1984 and 1996    PT Assessment/Plan/Recommendation PT Assessment Clinical Impression Statement: Pt is a 56 y/o male admitted s/p right THA along with the below PT problem list.  Pt would benefit from acute PT to maximize independence to facilitate d/c home with HHPT. PT Recommendation/Assessment: Patient will need skilled PT in the acute care venue PT Problem List: Decreased strength;Decreased activity tolerance;Decreased balance;Decreased mobility;Decreased knowledge of use of DME;Decreased knowledge of precautions;Pain Barriers to Discharge: None PT Therapy Diagnosis : Difficulty walking;Acute pain PT Plan PT Frequency: 7X/week PT Treatment/Interventions: DME instruction;Gait training;Stair training;Therapeutic activities;Functional mobility training;Therapeutic exercise;Balance training;Patient/family education PT Recommendation Follow Up Recommendations: Home health PT Equipment Recommended: None recommended by PT PT Goals  Acute Rehab PT Goals PT Goal Formulation: With patient Time For Goal Achievement: 7 days Pt will go Supine/Side to Sit: with modified independence PT Goal: Supine/Side to Sit - Progress: Not met Pt will go Sit to Supine/Side: with modified independence PT Goal: Sit to Supine/Side - Progress: Not met Pt will go Sit to Stand: with modified independence PT Goal: Sit to Stand - Progress: Not met Pt will go Stand to Sit: with modified independence PT Goal: Stand to Sit -  Progress: Not met Pt will Ambulate: >150 feet;with modified independence;with least restrictive assistive device PT Goal: Ambulate - Progress: Not met Pt will Go Up / Down Stairs: Flight;with modified independence;with least restrictive assistive device PT Goal: Up/Down Stairs - Progress: Not met Pt will Perform Home Exercise Program: Independently PT Goal: Perform Home Exercise Program - Progress: Not met  PT Evaluation Precautions/Restrictions  Precautions Precautions: Posterior Hip Precaution Booklet Issued: Yes (comment) (Posterior Hip Precaution handout given on evaluation.) Required Braces or Orthoses: No Restrictions Weight Bearing Restrictions: Yes RLE Weight Bearing: Touchdown weight bearing Prior Functioning  Home Living Lives With: Spouse Type of Home: House Home Layout: Two level Alternate Level Stairs-Rails: Right Alternate Level Stairs-Number of Steps: 12 Home Access: Stairs to enter Entrance Stairs-Rails: None Entrance Stairs-Number of Steps: 3 Home Adaptive Equipment: Bedside commode/3-in-1;Crutches;Walker - rolling;Shower chair with back Prior Function Level of Independence: Independent with basic ADLs;Independent with homemaking with ambulation;Independent with gait;Independent with transfers Able to Take Stairs?: Reciprically Driving: Yes Cognition Cognition Arousal/Alertness: Awake/alert Overall Cognitive Status: Appears within functional limits for tasks assessed Orientation Level: Oriented X4 Sensation/Coordination Sensation Light Touch: Appears Intact Stereognosis: Not tested Hot/Cold: Not tested Proprioception: Not tested Coordination Gross Motor Movements are Fluid and Coordinated: Yes Fine Motor Movements are Fluid and Coordinated: Yes Extremity Assessment RUE Assessment RUE Assessment: Not tested LUE Assessment LUE Assessment: Not tested RLE Assessment RLE Assessment: Exceptions to [facility] RLE Strength RLE Overall Strength: Due to  precautions;Due to pain RLE Overall Strength Comments: 3/5 LLE Assessment LLE Assessment: Within Functional Limits Pain 6/10 in right hip.  Pt repositioned after evaluation. Mobility (including Balance) Bed Mobility Bed Mobility: Yes Supine to Sit: 4: Min assist;HOB elevated (Comment degrees) (HOB 20 degrees.) Supine to  Sit Details (indicate cue type and reason): Assist for right LE secondary to pain with cues for sequence throughout treatment. Sitting - Scoot to Edge of Bed: 4: Min assist Sitting - Scoot to [place] of Bed Details (indicate cue type and reason): Assist for right LE secondary to pain.  Cues for sequence. Transfers Transfers: Yes Sit to Stand: 4: Min assist;With upper extremity assist;From bed;From chair/3-in-1 Sit to Stand Details (indicate cue type and reason): Assist secondary to decreased strength with cues for sequence. Stand to Sit: 4: Min assist;With upper extremity assist;To chair/3-in-1 Stand to Sit Details: Assist to slow descent to surface with cues for hand and right LE placement. Ambulation/Gait Ambulation/Gait: Yes Ambulation/Gait Assistance: 4: Min assist Ambulation/Gait Assistance Details (indicate cue type and reason): Assist for balance with cues for sequence inside RW. Ambulation Distance (Feet): 80 Feet Assistive device: Rolling walker Gait Pattern: Step-to pattern;Decreased step length - right;Right flexed knee in stance Stairs: No Wheelchair Mobility Wheelchair Mobility: No  Posture/Postural Control Posture/Postural Control: No significant limitations Balance Balance Assessed: No Exercise  Total Joint Exercises Ankle Circles/Pumps: AROM;Right;10 reps;Supine Quad Sets: AROM;Right;10 reps;Supine Gluteal Sets: AROM;Both;10 reps;Supine Heel Slides: AAROM;Right;10 reps;Supine Hip ABduction/ADduction: AAROM;Right;10 reps;Supine End of Session PT - End of Session Equipment Utilized During Treatment: Gait belt Activity Tolerance: Patient tolerated  treatment well Patient left: in chair;with call bell in reach Nurse Communication: Mobility status for transfers;Mobility status for ambulation General Behavior During Session: [facility] for tasks performed Cognition: [facility] for tasks performed  Cephus Shelling 09/15/2011, 9:10 AM  09/15/2011 Cephus Shelling, PT, DPT [phone]

## 2011-09-15 NOTE — Progress Notes (Signed)
Physical Therapy Treatment Patient Details Name: Kyle Lawson MRN: [id number] DOB: [birth year] Today's Date: 09/15/2011  PT Assessment/Plan  PT - Assessment/Plan Comments on Treatment Session: Pt admitted for right THA and very motivated.  Pt progressing with each session. PT Plan: Discharge plan remains appropriate;Frequency remains appropriate PT Frequency: 7X/week Follow Up Recommendations: Home health PT Equipment Recommended: None recommended by PT PT Goals  Acute Rehab PT Goals PT Goal Formulation: With patient Time For Goal Achievement: 7 days PT Goal: Sit to Stand - Progress: Progressing toward goal PT Goal: Stand to Sit - Progress: Progressing toward goal PT Goal: Ambulate - Progress: Progressing toward goal PT Goal: Up/Down Stairs - Progress: Progressing toward goal  PT Treatment Precautions/Restrictions  Precautions Precautions: Posterior Hip Precaution Booklet Issued: No Required Braces or Orthoses: No Restrictions Weight Bearing Restrictions: Yes RLE Weight Bearing: Touchdown weight bearing Pain 3/10 in right hip.  Pt repositioned after treatment and ice applied. Mobility (including Balance) Bed Mobility Bed Mobility: No Supine to Sit: Not tested (comment) Sitting - Scoot to Edge of Bed: Not tested (comment) Transfers Transfers: Yes Sit to Stand: 5: Supervision;With upper extremity assist;From chair/3-in-1 Sit to Stand Details (indicate cue type and reason): Verbal cues for right LE and hand placement. Stand to Sit: 5: Supervision;With upper extremity assist;To chair/3-in-1 Stand to Sit Details: Verbal cues for hand and right LE placement. Ambulation/Gait Ambulation/Gait: Yes Ambulation/Gait Assistance: 5: Supervision Ambulation/Gait Assistance Details (indicate cue type and reason): Verbal cues for initial contact with right heel and for touch down weight bearing.  Pt tending to prefer NWBing right LE. Ambulation Distance (Feet): 300 Feet Assistive device:  Rolling walker Gait Pattern: Step-to pattern;Decreased step length - right;Right flexed knee in stance Stairs: Yes Stairs Assistance: 4: Min assist Stairs Assistance Details (indicate cue type and reason): Two trials completed.  First attempted forward for 2 steps with axillary crutches and min assist.  Second trial backwards with RW and min assist. Stair Management Technique: Step to pattern;Forwards;With crutches (2nd trial backward, step to, with RW.) Number of Stairs: 2  (2 stairs x 2 trials.) Height of Stairs: 8  (8 inches) Wheelchair Mobility Wheelchair Mobility: No  Posture/Postural Control Posture/Postural Control: No significant limitations Balance Balance Assessed: No End of Session PT - End of Session Equipment Utilized During Treatment: Gait belt Activity Tolerance: Patient tolerated treatment well Patient left: in chair;with call bell in reach;with family/visitor present Nurse Communication: Mobility status for transfers;Mobility status for ambulation General Behavior During Session: [facility] for tasks performed Cognition: [facility] for tasks performed  Cephus Shelling 09/15/2011, 2:19 PM  09/15/2011 Cephus Shelling, PT, DPT [phone]

## 2011-09-15 NOTE — Progress Notes (Signed)
CARE MANAGEMENT NOTE 09/15/2011  Discharge planning. Spoke with patient. choice offered. preoperatively setup with [facility], no changes. Patient has rolling walker and 3in1 from previous surgery.

## 2011-09-15 NOTE — Progress Notes (Signed)
ANTICOAGULATION CONSULT NOTE - Follow Up Consult  Pharmacy Consult for Coumadin Indication: VTE prophylaxis s/p R THA  Assessment: 56 yo M on Coumadin for VTE prophylaxis. INR is SUBtherapeutic after first dose of Coumadin. Noted some bleeding through dressing but no overt bleeding.  Goal of Therapy:  INR 2-3   Plan:  1. Coumadin 7.5 mg po tonight 2. INR daily   No Known Allergies  Patient Measurements: Height: 5\' 9"  (175.3 cm) Weight: 191 lb 9.3 oz (86.901 kg) IBW/kg (Calculated) : 70.7   Vital Signs: Temp: 99.7 F (37.6 C) (01/10 0630) BP: 111/63 mmHg (01/10 0630) Pulse Rate: 87  (01/10 0630)  Labs:  Basename 09/15/11 0610  HGB 10.0*  HCT 28.8*  PLT 197  APTT --  LABPROT 15.8*  INR 1.23  HEPARINUNFRC --  CREATININE 0.98  CKTOTAL --  CKMB --  TROPONINI --   Estimated Creatinine Clearance: 93 ml/min (by C-G formula based on Cr of 0.98).   Medications:  Scheduled:    . allopurinol  300 mg Oral Daily  . ceFAZolin (ANCEF) IV  1 g Intravenous Q8H  . coumadin book   Does not apply Once  . docusate sodium  100 mg Oral BID  . enoxaparin  40 mg Subcutaneous Q24H  . HYDROmorphone      . ketorolac      . lisinopril  40 mg Oral Daily  . simvastatin  20 mg Oral q1800  . sodium chloride  500 mL Intravenous Once  . warfarin  7.5 mg Oral ONCE-1800  . warfarin  7.5 mg Oral ONCE-1800  . warfarin   Does not apply Once  . DISCONTD: morphine   Intravenous Q4H    [place], Victorino Dike Danielle 09/15/2011,12:52 PM

## 2011-09-15 NOTE — Progress Notes (Signed)
Occupational Therapy Evaluation Patient Details Name: Kyle Lawson MRN: [id number] DOB: [birth year] Today's Date: 09/15/2011  Problem List: There is no problem list on file for this patient.   Past Medical History:  Past Medical History  Diagnosis Date  . Kidney stone   . Hypertension   . GERD (gastroesophageal reflux disease)     occasional  . Gout   . Arthritis    Past Surgical History:  Past Surgical History  Procedure Date  . Joint replacement     left hip 09/2006  . Back surgery     1984 and 1996  . Total hip arthroplasty 09/14/2011    Procedure: TOTAL HIP ARTHROPLASTY;  Surgeon: Loreta Ave, MD;  Location[facility];  Service: Orthopedics;  Laterality: Right;    OT Assessment/Plan/Recommendation OT Assessment Clinical Impression Statement: Pt presents to OT s/p THR.  OT educated pt on ADL activity with hip prec and WB restrictions.  No further OT needed at this time. OT Recommendation/Assessment: Patient does not need any further OT services OT Recommendation Follow Up Recommendations: No OT follow up Equipment Recommended: None recommended by OT;Other (comment) (pt has all equipment)     OT Evaluation Precautions/Restrictions  Precautions Precautions: Posterior Hip Precaution Booklet Issued: Yes (comment) (Posterior Hip Precaution handout given on evaluation.) Required Braces or Orthoses: No Restrictions Weight Bearing Restrictions: Yes RLE Weight Bearing: Touchdown weight bearing Prior Functioning Home Living Lives With: Spouse Type of Home: House Home Layout: Two level Alternate Level Stairs-Rails: Right Alternate Level Stairs-Number of Steps: 12 Home Access: Stairs to enter Entrance Stairs-Rails: None Entrance Stairs-Number of Steps: 3 Bathroom Shower/Tub: Banker: Grab bars in shower Prior Function Level of Independence: Independent with basic ADLs;Independent with homemaking with ambulation;Independent with  gait;Independent with transfers Able to Take Stairs?: Reciprically Driving: Yes ADL ADL Toilet Transfer: Minimal assistance;Performed Toilet Transfer Details (indicate cue type and reason): sit to stand with urinal ADL Comments: Pt educated in regards to hip precautions s/p THR.  Pt had hip replacement on right (he had one on left 5 years ago).  OT educated pt regarding hip precautions with ADL activity.  Pt has reacher, sock aid, horn and  elevated toilet at home.  Pt able to verbalize all activites with precautions,    Cognition Cognition Arousal/Alertness: Awake/alert Overall Cognitive Status: Appears within functional limits for tasks assessed Orientation Level: Oriented X4 Sensation/Coordination Sensation Light Touch: Appears Intact Stereognosis: Not tested Hot/Cold: Not tested Proprioception: Not tested Coordination Gross Motor Movements are Fluid and Coordinated: Yes Fine Motor Movements are Fluid and Coordinated: Yes Extremity Assessment RUE Assessment RUE Assessment: Within Functional Limits LUE Assessment LUE Assessment: Within Functional Limits Mobility  Bed Mobility Bed Mobility: Yes Supine to Sit: 4: Min assist;HOB elevated (Comment degrees) (HOB 20 degrees.) Supine to Sit Details (indicate cue type and reason): Assist for right LE secondary to pain with cues for sequence throughout treatment. Sitting - Scoot to Edge of Bed: 4: Min assist Sitting - Scoot to [place] of Bed Details (indicate cue type and reason): Assist for right LE secondary to pain.  Cues for sequence. Transfers Sit to Stand: 4: Min assist;With upper extremity assist;From bed;From chair/3-in-1 Sit to Stand Details (indicate cue type and reason): Assist secondary to decreased strength with cues for sequence. Stand to Sit: 4: Min assist;With upper extremity assist;To chair/3-in-1 Stand to Sit Details: Assist to slow descent to surface with cues for hand and right LE placement. End of Session OT - End  of Session  Activity Tolerance: Patient tolerated treatment well Patient left: in chair General Behavior During Session: [facility] for tasks performed Cognition: [facility] for tasks performed   [facility], [place] Kung 09/15/2011, 12:25 PM

## 2011-09-15 NOTE — Progress Notes (Signed)
Subjective: Doing well.  Pain controlled.     Objective: Vital signs in last 24 hours: Temp:  [97.1 F (36.2 C)-99.7 F (37.6 C)] 99.7 F (37.6 C) (01/10 0630) Pulse Rate:  [73-91] 87  (01/10 0630) Resp:  [11-36] 20  (01/10 0630) BP: (111-143)/(63-98) 111/63 mmHg (01/10 0630) SpO2:  [92 %-100 %] 96 % (01/10 0630) Weight:  [86.901 kg (191 lb 9.3 oz)] 86.901 kg (191 lb 9.3 oz) (01/09 1430)  Intake/Output from previous day: 01/09 0701 - 01/10 0700 In: 4018.5 [P.O.:720; I.V.:3148.5; IV Piggyback:150] Out: 2475 [Urine:2075; Blood:400] Intake/Output this shift:     Basename 09/15/11 0610 09/12/11 0842  HGB 10.0* 14.7    Basename 09/15/11 0610 09/12/11 0842  WBC 9.4 9.9  RBC 3.04* 4.45  HCT 28.8* 43.1  PLT 197 258    Basename 09/15/11 0610 09/12/11 0842  NA 129* 139  K 4.2 4.3  CL 99 102  CO2 25 28  BUN 16 23  CREATININE 0.98 0.97  GLUCOSE 120* 92  CALCIUM 7.9* 9.6    Basename 09/15/11 0610 09/12/11 0842  LABPT -- --  INR 1.23 0.97    Neurovascular intact Some bleeding through dressing.  Calf nontender.   Assessment/Plan: -hyponatremia.  Give 500cc bolus ns. -d/c pca -anticipate d/c home tomorrow if progressing well with therapy   Kyle Lawson M 09/15/2011, 8:37 AM

## 2011-09-16 LAB — PROTIME-INR
INR: 1.48 (ref 0.00–1.49)
Prothrombin Time: 18.2 seconds — ABNORMAL HIGH (ref 11.6–15.2)

## 2011-09-16 LAB — CBC
HCT: 25.7 % — ABNORMAL LOW (ref 39.0–52.0)
Hemoglobin: 9 g/dL — ABNORMAL LOW (ref 13.0–17.0)
RBC: 2.73 MIL/uL — ABNORMAL LOW (ref 4.22–5.81)
WBC: 10.3 10*3/uL (ref 4.0–10.5)

## 2011-09-16 LAB — BASIC METABOLIC PANEL
BUN: 16 mg/dL (ref 6–23)
CO2: 27 mEq/L (ref 19–32)
Chloride: 101 mEq/L (ref 96–112)
GFR calc non Af Amer: >90 mL/min (ref >90)
Glucose, Bld: 107 mg/dL — ABNORMAL HIGH (ref 70–99)
Potassium: 3.8 mEq/L (ref 3.5–5.1)
Sodium: 132 mEq/L — ABNORMAL LOW (ref 135–145)

## 2011-09-16 MED ORDER — WARFARIN SODIUM 7.5 MG PO TABS
7.5000 mg | ORAL_TABLET | Freq: Once | ORAL | Status: DC
  Filled 2011-09-16: qty 1

## 2011-09-16 NOTE — Discharge Instructions (Signed)
Home Health to be provided thru [facility]- [phone]

## 2011-09-16 NOTE — Progress Notes (Signed)
Physical Therapy Treatment Patient Details Name: Kyle Lawson MRN: [id number] DOB: 02/29/1956 Today's Date: 09/16/2011  PT Assessment/Plan  PT - Assessment/Plan Comments on Treatment Session: Pt admitted for right THA and very motivated.  Pt progressing with each session and ready for d/c home once medically cleared by MD. PT Plan: Discharge plan remains appropriate;Frequency remains appropriate PT Frequency: 7X/week Follow Up Recommendations: Home health PT Equipment Recommended: None recommended by PT PT Goals  Acute Rehab PT Goals PT Goal Formulation: With patient Time For Goal Achievement: 7 days PT Goal: Sit to Stand - Progress: Met PT Goal: Stand to Sit - Progress: Met PT Goal: Ambulate - Progress: Progressing toward goal PT Goal: Up/Down Stairs - Progress: Progressing toward goal PT Goal: Perform Home Exercise Program - Progress: Progressing toward goal  PT Treatment Precautions/Restrictions  Precautions Precautions: Posterior Hip Precaution Booklet Issued: No Required Braces or Orthoses: No Restrictions Weight Bearing Restrictions: Yes RLE Weight Bearing: Touchdown weight bearing Pain 4/10 in right hip with mobility.  Pt repositioned. Mobility (including Balance) Bed Mobility Bed Mobility: No Supine to Sit: Not tested (comment) Sitting - Scoot to Edge of Bed: Not tested (comment) Transfers Transfers: Yes Sit to Stand: 6: Modified independent (Device/Increase time) Stand to Sit: 6: Modified independent (Device/Increase time) Ambulation/Gait Ambulation/Gait: Yes Ambulation/Gait Assistance: 5: Supervision Ambulation/Gait Assistance Details (indicate cue type and reason): Verbal cues for initial contact on right heel to normalize gait sequence for step-through while still maintaining TDWBing status of right LE.  Pt with tendency to perform NWBing right LE. Ambulation Distance (Feet): 300 Feet Assistive device: Rolling walker Gait Pattern: Step-through  pattern;Decreased step length - right;Decreased stance time - right;Right flexed knee in stance Stairs: Yes Stairs Assistance: 4: Min assist Stairs Assistance Details (indicate cue type and reason): Assist only to stabilize RW for backwards sequence.  Pt able to verbalize sequence independently without LOB during sequence. Stair Management Technique: Backwards;Step to pattern;With walker Number of Stairs: 2  Height of Stairs: 8  (8 inches.) Wheelchair Mobility Wheelchair Mobility: No  Posture/Postural Control Posture/Postural Control: No significant limitations Balance Balance Assessed: No Exercise  Total Joint Exercises Long Arc Quad: AROM;Right;10 reps;Seated End of Session PT - End of Session Equipment Utilized During Treatment: Gait belt Activity Tolerance: Patient tolerated treatment well Patient left: in chair;with call bell in reach Nurse Communication: Mobility status for transfers;Mobility status for ambulation General Behavior During Session: [facility] for tasks performed Cognition: [facility] for tasks performed  Cephus Shelling 09/16/2011, 12:15 PM  09/16/2011 Cephus Shelling, PT, DPT [phone]

## 2011-09-16 NOTE — Progress Notes (Signed)
ANTICOAGULATION CONSULT NOTE - Follow Up Consult  Pharmacy Consult for warfarin Indication: VTE prophylaxis  No Known Allergies   Vital Signs: Temp: 97.9 F (36.6 C) (01/11 0615) BP: 102/64 mmHg (01/11 0615) Pulse Rate: 80  (01/11 0615)  Labs:  Basename 09/16/11 0600 09/15/11 0610  HGB 9.0* 10.0*  HCT 25.7* 28.8*  PLT 180 197  APTT -- --  LABPROT 18.2* 15.8*  INR 1.48 1.23  HEPARINUNFRC -- --  CREATININE 0.96 0.98  CKTOTAL -- --  CKMB -- --  TROPONINI -- --   Estimated Creatinine Clearance: 94.9 ml/min (by C-G formula based on Cr of 0.96).   Medications:  Scheduled:     . allopurinol  300 mg Oral Daily  . docusate sodium  100 mg Oral BID  . enoxaparin  40 mg Subcutaneous Q24H  . lisinopril  40 mg Oral Daily  . simvastatin  20 mg Oral q1800  . warfarin  7.5 mg Oral ONCE-1800   Infusions:    . DISCONTD: 0.9 % NaCl with KCl 20 mEq / L 95 mL/hr at 09/15/11 0333   PRN: acetaminophen, acetaminophen, menthol-cetylpyridinium, methocarbamol(ROBAXIN) IV, methocarbamol, ondansetron (ZOFRAN) IV, ondansetron, oxyCODONE-acetaminophen, phenol, senna-docusate  Assessment: 56yo male s/p right hip surgery.  25.7/9 H/H Glucose 107 Coumadin score = 7.  Received 7.5mg  yesterday and the INR rose from 1.23 to 1.48.  Not therapeutic yet.  Goal of Therapy:  INR 2-3   Plan:  Patient to be discharged soon. Coumadin 7.5mg  at 1800 today with INR  monitoring post discharge.  Walden Field B 09/16/2011,12:03 PM

## 2011-09-16 NOTE — Op Note (Signed)
NAME:  Kyle Lawson, Kyle Lawson NO.:  [id number]  MEDICAL RECORD NO.:  [id number]  LOCATION:  5038                         FACILITY:  [facility]  PHYSICIAN:  Loreta Ave, M.D. DATE OF BIRTH:  1956/02/09  DATE OF PROCEDURE:  09/14/2011 DATE OF DISCHARGE:                              OPERATIVE REPORT   PREOPERATIVE DIAGNOSIS:  Right hip end-stage degenerative arthritis.  POSTOPERATIVE DIAGNOSE:  Right hip end-stage degenerative arthritis.  PROCEDURE:  Right total hip replacement utilizing a Stryker accolade to prosthesis.  A press-fit 58-mm acetabular component.  A 36-mm internal diameter polyethylene liner with a 10-degree overhang.  Two screws through the metallic portion of the cup.  A press-fit #6 accolade 2 femoral component.  A 127-degree neck angle.  A 36-mm Biolox head -5.  SURGEON:  Loreta Ave, MD  ASSISTANT:  Genene Churn. Barry Dienes, [place], present throughout the entire case necessary for timely completion of procedure.  ANESTHESIA:  General.  BLOOD LOSS:  250 mL.  BLOOD GIVEN:  None.  SPECIMENS:  None.  COUNTS:  None.  COMPLICATION:  None.  DRESSING:  Soft compressive abduction pillow.  PROCEDURE IN DETAIL:  The patient was brought to the operating room, placed on the operating room table in supine position.  After adequate anesthesia had been obtained, turned to a lateral position right side up.  Prepped and draped in usual sterile fashion.  Incision along the shaft of femur to the trochanter extending posterosuperior.  Skin and subcutaneous tissue divided.  Hemostasis cautery.  Iliotibial band exposed, incised, Charnley retractor put in place.  Neurovascular structures identified, protected.  External rotator capsule taken down off the back of the intertrochanteric groove, tied with FiberWire.  Hip dislocated posteriorly.  Grade 4 changes.  Femoral neck cut 1 fingerbreadth above the lesser trochanter.  Acetabulum exposed with marked changes.   Debrided labrum cleared out.  Brought up to good bleeding bone, sufficient medial and inferior placement.  Sized for a 58 mm cup which was hammered in place at 45 degrees of abduction, 20 degrees of anteversion.  Good capturing and fixation, augmented with 2 screws.  Predrilled placed through the cup.  The 36 mm internal diameter liner placed with a 10 degree overhang placed posterosuperior. Attention turned to the femur.  Sequential reaming and broaches brought up to good filling proximally and distally with a #6 accolade 2 component 127 degree neck angle.  After appropriate trials, implant was seated.  After appropriate trials, I then added a 36 mm Biolox head -5. Hip was reduced.  With that construct I had equal leg lengths, excellent stability in flexion and extension.  Wound irrigated.  External rotator capsule repaired to the back of the intertrochanteric groove through drill holes.  FiberWire tied over bony bridge.  Charnley retractor removed.  Iliotibial band closed with 1 Vicryl.  Skin and subcutaneous tissue with Vicryl and staples.  Margins were injected with Marcaine. Sterile compressive dressing applied.  Returned to supine position. Abduction pillow placed.  Anesthesia reversed.  Brought to the recovery room.  Tolerated the surgery well.  No complications.     Loreta Ave, M.D.     DFM/MEDQ  D:  09/15/2011  T:  09/16/2011  Job:  [id number]

## 2011-09-16 NOTE — Progress Notes (Signed)
Subjective: Doing well.  Pain controlled. Did great with therapy and wants to go home.  Objective: Vital signs in last 24 hours: Temp:  [97.9 F (36.6 C)-99.1 F (37.3 C)] 97.9 F (36.6 C) (01/11 0615) Pulse Rate:  [80-87] 80  (01/11 0615) Resp:  [18] 18  (01/11 0615) BP: (100-112)/(50-64) 102/64 mmHg (01/11 0615) SpO2:  [95 %-97 %] 97 % (01/11 0615)  Intake/Output from previous day: 01/10 0701 - 01/11 0700 In: 900 [P.O.:900] Out: 1750 [Urine:1750] Intake/Output this shift:     Basename 09/16/11 0600 09/15/11 0610  HGB 9.0* 10.0*    Basename 09/16/11 0600 09/15/11 0610  WBC 10.3 9.4  RBC 2.73* 3.04*  HCT 25.7* 28.8*  PLT 180 197    Basename 09/16/11 0600 09/15/11 0610  NA 132* 129*  K 3.8 4.2  CL 101 99  CO2 27 25  BUN 16 16  CREATININE 0.96 0.98  GLUCOSE 107* 120*  CALCIUM 8.6 7.9*    Basename 09/16/11 0600 09/15/11 0610  LABPT -- --  INR 1.48 1.23    Neurovascular intact Compartment soft Wound looks good.  Staples intact.  No drainage or signs of infection.   Assessment/Plan: D/c home today.  F/u 2 weeks postop.   Kyle Lawson M 09/16/2011, 9:27 AM

## 2011-09-16 NOTE — Progress Notes (Signed)
ANTICOAGULATION CONSULT NOTE - Follow Up Consult  Pharmacy Consult for Coumadin Indication: VTE prophylaxis s/p R THA  Assessment: 56 yo M on Coumadin for VTE prophylaxis. INR is SUBtherapeutic at 1.48. No bleeding noted today.     Goal of Therapy:  INR 2-3   Plan:  1. Coumadin 7.5 mg po tonight 2. INR daily   No Known Allergies  Patient Measurements: Height: 5\' 9"  (175.3 cm) Weight: 191 lb 9.3 oz (86.901 kg) IBW/kg (Calculated) : 70.7   Vital Signs: Temp: 97.9 F (36.6 C) (01/11 0615) BP: 102/64 mmHg (01/11 0615) Pulse Rate: 80  (01/11 0615)  Labs:  Basename 09/16/11 0600 09/15/11 0610  HGB 9.0* 10.0*  HCT 25.7* 28.8*  PLT 180 197  APTT -- --  LABPROT 18.2* 15.8*  INR 1.48 1.23  HEPARINUNFRC -- --  CREATININE 0.96 0.98  CKTOTAL -- --  CKMB -- --  TROPONINI -- --   Estimated Creatinine Clearance: 94.9 ml/min (by C-G formula based on Cr of 0.96).   Medications:  Scheduled:      . allopurinol  300 mg Oral Daily  . docusate sodium  100 mg Oral BID  . enoxaparin  40 mg Subcutaneous Q24H  . lisinopril  40 mg Oral Daily  . simvastatin  20 mg Oral q1800  . warfarin  7.5 mg Oral ONCE-1800     Darreld Mclean 09/16/2011,12:06 PM

## 2011-09-18 NOTE — Discharge Summary (Signed)
   ABBREVIATED DISCHARGE SUMMARY  DATE OF HOSPITALIZATION:  14 Sep 2011    REASON FOR HOSPITALIZATION:  55 YO WM W/ END STAGE DJD RIGHT HIP AND CHRONIC PAIN.  FAILED CONSERVATIVE TX.      SIGNIFICANT FINDINGS:  DJD OPERATION:  RIGHT THR  FINAL DIAGNOSIS:  SAME   SECONDARY DIAGNOSIS:  NONE   CONSULTANTS:  NONE  DISCHARGE CONDITION:  STABLE  DISCHARGED TO:  HOME

## 2011-10-20 ENCOUNTER — Other Ambulatory Visit (HOSPITAL_COMMUNITY): Payer: Self-pay

## 2015-08-24 LAB — HM COLONOSCOPY

## 2016-04-28 DIAGNOSIS — Z205 Contact with and (suspected) exposure to viral hepatitis: Secondary | ICD-10-CM | POA: Diagnosis not present

## 2016-06-28 DIAGNOSIS — L03116 Cellulitis of left lower limb: Secondary | ICD-10-CM | POA: Diagnosis not present

## 2016-06-28 DIAGNOSIS — S81802A Unspecified open wound, left lower leg, initial encounter: Secondary | ICD-10-CM | POA: Diagnosis not present

## 2016-06-28 DIAGNOSIS — Z23 Encounter for immunization: Secondary | ICD-10-CM | POA: Diagnosis not present

## 2016-09-12 DIAGNOSIS — D225 Melanocytic nevi of trunk: Secondary | ICD-10-CM | POA: Diagnosis not present

## 2016-09-12 DIAGNOSIS — D1801 Hemangioma of skin and subcutaneous tissue: Secondary | ICD-10-CM | POA: Diagnosis not present

## 2016-09-12 DIAGNOSIS — L570 Actinic keratosis: Secondary | ICD-10-CM | POA: Diagnosis not present

## 2016-09-12 DIAGNOSIS — L821 Other seborrheic keratosis: Secondary | ICD-10-CM | POA: Diagnosis not present

## 2016-09-12 DIAGNOSIS — Z808 Family history of malignant neoplasm of other organs or systems: Secondary | ICD-10-CM | POA: Diagnosis not present

## 2016-12-27 DIAGNOSIS — M25551 Pain in right hip: Secondary | ICD-10-CM | POA: Diagnosis not present

## 2017-01-19 ENCOUNTER — Encounter: Payer: Self-pay | Admitting: Sports Medicine

## 2017-01-19 ENCOUNTER — Ambulatory Visit (INDEPENDENT_AMBULATORY_CARE_PROVIDER_SITE_OTHER): Payer: Self-pay | Admitting: Sports Medicine

## 2017-01-19 DIAGNOSIS — M9903 Segmental and somatic dysfunction of lumbar region: Secondary | ICD-10-CM | POA: Diagnosis not present

## 2017-01-19 DIAGNOSIS — M9904 Segmental and somatic dysfunction of sacral region: Secondary | ICD-10-CM | POA: Diagnosis not present

## 2017-01-19 DIAGNOSIS — M5442 Lumbago with sciatica, left side: Secondary | ICD-10-CM

## 2017-01-19 DIAGNOSIS — M47816 Spondylosis without myelopathy or radiculopathy, lumbar region: Secondary | ICD-10-CM | POA: Diagnosis not present

## 2017-01-19 DIAGNOSIS — M5441 Lumbago with sciatica, right side: Secondary | ICD-10-CM

## 2017-01-19 DIAGNOSIS — M9902 Segmental and somatic dysfunction of thoracic region: Secondary | ICD-10-CM | POA: Diagnosis not present

## 2017-01-19 DIAGNOSIS — M9905 Segmental and somatic dysfunction of pelvic region: Secondary | ICD-10-CM | POA: Diagnosis not present

## 2017-01-19 MED ORDER — METHYLPREDNISOLONE 4 MG PO TBPK: ORAL_TABLET | ORAL | 0 refills | Status: DC

## 2017-01-19 NOTE — Patient Instructions (Signed)
Please perform the exercise program that Kyle Lawson has prepared for you and gone over in detail on a daily basis.  In addition to the handout you were provided you can access your program through: [email or web address]   Your unique program code is: [email or web address]  Also check out the YouTube Video from Dr. Myles LippsEric Lawson.   "Powerful Posture and Pain Relief: 12 minutes of Foundation Training" - [email or web address]

## 2017-01-19 NOTE — Progress Notes (Addendum)
OFFICE VISIT NOTE Veverly FellsMichael D. Delorise Shinerigby, DO  [facility] [place] [phone]  Arlyn DunningJohn Ricciardelli - 61 y.o. male MRN [id number]  Date of birth: 1956/02/27  Visit Date: 01/19/2017  PCP: No primary care provider on file.   Referred by: No ref. provider found  Orlie DakinBrandy Shelton, CMA acting as scribe for Dr. Berline Choughigby.  SUBJECTIVE:   Chief Complaint  Patient presents with  . pain in lower back   HPI: As below and per problem based documentation when appropriate.  Pt presents today for chronic low back pain and tingling in left foot Tingling in the left foot started about 8 weeks ago and comes and goes.  Pt has chronic back pain, hx of 2 surgeries in 1984 and 1996. 8 weeks ago he was doing yard work and his back started to spasm. He has had trouble with radiculopathy in the past.  The pain is described as tingling and numbess and is rated as 4/10-6/10.  Worsened with sitting for long periods of time Improves with getting up and moving around Therapies tried include massage therapy, stretching, Ibuprofen, Acetaminophen  Other associated symptoms include: Pt denies current pain in left leg and back pain has improved. He did have some warmth in the left calf which subsided about 1-2 weeks ago.   Pt denies fever, chills, night sweats      Review of Systems  Constitutional: Negative for chills and fever.  Respiratory: Negative for shortness of breath.   Cardiovascular: Negative for chest pain and leg swelling.  Musculoskeletal: Positive for back pain. Negative for falls.  Neurological: Positive for tingling. Negative for dizziness and headaches.    Otherwise per HPI.  HISTORY & PERTINENT PRIOR DATA:  No specialty comments available. He reports that he has never smoked. He has never used smokeless tobacco. No results for input(s): HGBA1C, LABURIC in the last 8760 hours. Medications & Allergies reviewed per EMR Patient Active Problem List   Diagnosis Date  Noted  . Gout 02/05/2017  . Lumbar spondylosis 01/19/2017  . Acute bilateral low back pain with bilateral sciatica 01/19/2017   Past Medical History:  Diagnosis Date  . Arthritis   . GERD (gastroesophageal reflux disease)    occasional  . Gout   . Hypertension   . Kidney stone    No family history on file. Past Surgical History:  Procedure Laterality Date  . BACK SURGERY     1984 and 1996  . JOINT REPLACEMENT     left hip 09/2006  . TOTAL HIP ARTHROPLASTY  09/14/2011   Procedure: TOTAL HIP ARTHROPLASTY;  Surgeon: Loreta Aveaniel F Murphy, MD;  [facility];  Service: Orthopedics;  Laterality: Right;   Social History   Occupational History  . Not on file.   Social History Main Topics  . Smoking status: Never Smoker  . Smokeless tobacco: Never Used  . Alcohol use Yes     Comment: occasionally  . Drug use: Unknown  . Sexual activity: Not on file    OBJECTIVE:  VS:  HT:5\' 9"  (175.3 cm)   WT:186 lb 9.6 oz (84.6 kg)  BMI:27.6    BP:120/82  HR:91bpm  TEMP: ( )  RESP:97 % EXAM: Findings:  WDWN, NAD, Non-toxic appearing Alert & appropriately interactive Not depressed or anxious appearing No increased work of breathing. Pupils are equal. EOM intact without nystagmus No clubbing or cyanosis of the extremities appreciated No significant rashes/lesions/ulcerations overlying the examined area. DP & PT pulses 2+/4.  No  significant pretibial edema. Sensation intact to light touch in lower extremities.  There is a slight dysesthesia in the L5 dermatome on the left  Back & Lower Extremities: Tight bilateral hamstrings with positive SLR on the left. No significant midline tenderness.   Slight TTP over the greater sciatic notch as well as popliteal space on the left, normal on the right Good internal and external rotation of the hips. Manual muscle testing is 5+/5 in BLE myotomes without focality Lower extremity DTRs 1+/4 diffusely and symmetric  OSTEOPATHIC/STRUCTURAL EXAM:     T8 through T10 neutral rotated right L2 FRS right L4 FRS left Right on right sacral torsion Left superior shear of the pelvis Left externally rotated hip     No results found. ASSESSMENT & PLAN:   Problem List Items Addressed This Visit    Lumbar spondylosis    Symptoms are consistent with lumbar spondylosis with radiculitis secondary to core deconditioning.  Suspect some component of spinal stenosis.  Medrol Dosepak recommended today and consider gabapentin but he would like to hold off.  Therapeutic exercises working on core conditioning as well as hip flexor stretching were reviewed with athletic training staff in detail his focus on neuromuscular control as well as strengthening as above.  +++++++++++++++++++++++++++++++++++++++++++++++++++++++++++++++ PROCEDURE NOTE: THERAPEUTIC EXERCISES (97110) 15 minutes spent for Therapeutic exercises as stated in above notes.  This included exercises focusing on stretching, strengthening, with significant focus on eccentric aspects.   Proper technique shown and discussed handout in great detail with ATC.  All questions were discussed and answered.        Relevant Medications   colchicine 0.6 MG tablet   indomethacin (INDOCIN) 50 MG capsule   methylPREDNISolone (MEDROL DOSEPAK) 4 MG TBPK tablet   Acute bilateral low back pain with bilateral sciatica   Relevant Medications   indomethacin (INDOCIN) 50 MG capsule   methylPREDNISolone (MEDROL DOSEPAK) 4 MG TBPK tablet      Follow-up: Return in about 6 weeks (around 03/02/2017).   CMA/ATC served as Neurosurgeon during this visit. History, Physical, and Plan performed by medical provider. Documentation and orders reviewed and attested to.      Gaspar Bidding, DO    [facility]

## 2017-02-05 DIAGNOSIS — M109 Gout, unspecified: Secondary | ICD-10-CM | POA: Insufficient documentation

## 2017-02-05 NOTE — Assessment & Plan Note (Signed)
Symptoms are consistent with lumbar spondylosis with radiculitis secondary to core deconditioning.  Suspect some component of spinal stenosis.  Medrol Dosepak recommended today and consider gabapentin but he would like to hold off.  Therapeutic exercises working on core conditioning as well as hip flexor stretching were reviewed with athletic training staff in detail his focus on neuromuscular control as well as strengthening as above.  +++++++++++++++++++++++++++++++++++++++++++++++++++++++++++++++ PROCEDURE NOTE: THERAPEUTIC EXERCISES (97110) 15 minutes spent for Therapeutic exercises as stated in above notes.  This included exercises focusing on stretching, strengthening, with significant focus on eccentric aspects.   Proper technique shown and discussed handout in great detail with ATC.  All questions were discussed and answered.

## 2017-02-05 NOTE — Assessment & Plan Note (Signed)
Given the findings as above and findings per osteopathic exam OMT was performed today including long lever to lumbar spine.  ++++++++++++++++++++++++++++++++++++++++++++ PROCEDURE NOTE : OSTEOPATHIC MANIPULATION The decision today to treat with Osteopathic Manipulative Therapy (OMT) was based on physical exam findings. Verbal consent was obtained after after explanation of risks, benefits and potential side effects, including acute pain flare, post manipulation soreness and need for repeat treatments.  If Cervical manipulation was performed additional time was spent discussing the associated minimal risk of  injury to neurovascular structures.  After consent was obtained manipulation was performed as below:            Regions treated:  Per billing codes          Techniques used:  Direct, Mus[employer], MFR, HVLA and ART The patient tolerated the treatment well and reported Improved symptoms following treatment today. Patient was given medications, exercises, stretches and lifestyle modifications per AVS and verbally.

## 2017-02-20 ENCOUNTER — Telehealth: Payer: Self-pay | Admitting: Sports Medicine

## 2017-02-20 NOTE — Telephone Encounter (Signed)
Sarah (Massage Therapist) called to advise that they have been working together for months and last week during SI joint relief on the left side really helped alleviate symptoms. Are there any further suggestions to help resolve this issue for the patient.   Call Massage Therapist Maralyn SagoSarah back at [phone].

## 2017-02-20 NOTE — Telephone Encounter (Signed)
Forwarding to Dr. Rigby.  

## 2017-02-23 ENCOUNTER — Ambulatory Visit (INDEPENDENT_AMBULATORY_CARE_PROVIDER_SITE_OTHER): Payer: BLUE CROSS/BLUE SHIELD | Admitting: Sports Medicine

## 2017-02-23 ENCOUNTER — Encounter: Payer: Self-pay | Admitting: Sports Medicine

## 2017-02-23 ENCOUNTER — Ambulatory Visit (INDEPENDENT_AMBULATORY_CARE_PROVIDER_SITE_OTHER): Payer: BLUE CROSS/BLUE SHIELD

## 2017-02-23 VITALS — BP 120/80 | HR 91 | Ht 69.0 in | Wt 185.0 lb

## 2017-02-23 DIAGNOSIS — M545 Low back pain: Secondary | ICD-10-CM | POA: Diagnosis not present

## 2017-02-23 DIAGNOSIS — M5442 Lumbago with sciatica, left side: Secondary | ICD-10-CM

## 2017-02-23 DIAGNOSIS — M47816 Spondylosis without myelopathy or radiculopathy, lumbar region: Secondary | ICD-10-CM | POA: Diagnosis not present

## 2017-02-23 DIAGNOSIS — M5441 Lumbago with sciatica, right side: Secondary | ICD-10-CM | POA: Diagnosis not present

## 2017-02-23 MED ORDER — GABAPENTIN 300 MG PO CAPS: ORAL_CAPSULE | ORAL | 1 refills | Status: DC

## 2017-02-23 NOTE — Progress Notes (Signed)
OFFICE VISIT NOTE Kyle FellsMichael D. Delorise Shinerigby, DO  [facility] [place] [phone]  Kyle DunningJohn Lawson - 61 y.o. male MRN [id number]  Date of birth: 01-25-1956  Visit Date: 02/23/2017  PCP: No primary care provider on file.   Referred by: No ref. provider found  Kyle CaoAutumn Lawson, cma acting as scribe for Dr. Berline Lawson.  SUBJECTIVE:   Chief Complaint  Patient presents with  . Follow-up  . Lumbar Spondylosis   HPI: As below and per problem based documentation when appropriate.  Kyle Lawson reports improvement in his lower back pain but no so much in his left foot--persistent numbness/tingling. Osteopathic Manipulation was performed during last visit with no relief. He has completed a Medrol dose pack which gave him mild relief. Taking Indomethacin 50 mg prn. He is consistent with the home therapeutic exercises. Pt has been working with a massage therapist for sx as well but no sure if there is any improvment. He does get relief when laying down and icing the foot. Had one episode of severe sx while sleeping--took a left over Vicodin at that point with relief. Sitting for long periods of time, hamstring stretching, hip abduction exercises triggers is sx. Being [name] gives him the most relief. He is in the process of getting a stand up desk at work to see if that helps.             Review of Systems  Constitutional: Negative for chills, diaphoresis, fever, malaise/fatigue and weight loss.  HENT: Negative.   Eyes: Negative.   Respiratory: Negative.   Cardiovascular: Negative.   Gastrointestinal: Negative.   Genitourinary: Negative.   Musculoskeletal: Positive for back pain, joint pain and myalgias. Negative for falls and neck pain.  Skin: Negative for itching and rash.  Neurological: Positive for tingling and weakness. Negative for dizziness, sensory change and headaches.  Endo/Heme/Allergies: Negative.  Negative for environmental allergies and polydipsia. Does  not bruise/bleed easily.  Psychiatric/Behavioral: Negative.     Otherwise per HPI.  HISTORY & PERTINENT PRIOR DATA:  No specialty comments available. He reports that he has never smoked. He has never used smokeless tobacco. No results for input(s): HGBA1C, LABURIC in the last 8760 hours. Medications & Allergies reviewed per EMR Patient Active Problem List   Diagnosis Date Noted  . Gout 02/05/2017  . Lumbar spondylosis 01/19/2017  . Acute bilateral low back pain with bilateral sciatica 01/19/2017   Past Medical History:  Diagnosis Date  . Arthritis   . GERD (gastroesophageal reflux disease)    occasional  . Gout   . Hypertension   . Kidney stone    History reviewed. No pertinent family history. Past Surgical History:  Procedure Laterality Date  . BACK SURGERY     1984 and 1996  . JOINT REPLACEMENT     left hip 09/2006  . TOTAL HIP ARTHROPLASTY  09/14/2011   Procedure: TOTAL HIP ARTHROPLASTY;  Surgeon: Loreta Aveaniel F Murphy, MD;  [facility];  Service: Orthopedics;  Laterality: Right;   Social History   Occupational History  . Not on file.   Social History Main Topics  . Smoking status: Never Smoker  . Smokeless tobacco: Never Used  . Alcohol use Yes     Comment: occasionally  . Drug use: Unknown  . Sexual activity: Not on file    OBJECTIVE:  VS:  HT:5\' 9"  (175.3 cm)   WT:185 lb (83.9 kg)  BMI:27.4    BP:120/80  HR:91bpm  TEMP: ( )  RESP:98 %  EXAM: Findings:  Adult male.  No acute distress.  Alert and appropriate. Bilateral lower extremities overall well aligned.  No significant deformity.  He has marked pain with straight leg raise would like to buttock.  Lower extremity sensation is intact although he does have a slight dysesthesia along the posterior aspect of his leg.  Independent review of his x-rays appear more consistent with a lumbarized S1 with underlying degenerative changes     Dg Lumbar Spine Complete  Result Date: 02/23/2017 CLINICAL DATA:  Low  back pain for the past 3 months with no known injury. EXAM: LUMBAR SPINE - COMPLETE 4+ VIEW COMPARISON:  None in PACs FINDINGS: The twelfth ribs are small. The lumbar vertebral bodies are preserved in height. The disc space heights are reasonably well-maintained. There small anterior endplate osteophytes at L3 and L4. There is no spondylolisthesis. There is mild facet joint hypertrophy at L4-5 and L5-S1. The pedicles and transverse processes are intact. The observed portions of the sacrum are normal. IMPRESSION: There is no acute or significant chronic bony abnormality of the lumbar spine. If there are radicular symptoms, lumbar spine MRI would be a useful next imaging step. Electronically Signed   By: David  [place] M.D.   On: 02/23/2017 08:55   ASSESSMENT & PLAN:   Problem List Items Addressed This Visit    Lumbar spondylosis   Relevant Medications   gabapentin (NEURONTIN) 300 MG capsule   Other Relevant Orders   DG Lumbar Spine Complete (Completed)   Ambulatory referral to [facility]   Acute bilateral low back pain with bilateral sciatica - Primary    Patient symptoms are consistent with a S1 radiculitis.  Does appear that he may have a lumbarized S1 which may be contributing to increased mobility in this region.  He would like to start with an empiric diagnostic and therapeutic epidural steroid injection as well as begin on gabapentin.  Okay to continue with massage and therapeutic exercises previously begun.  >50% of this 25 minute visit spent in direct patient counseling and/or coordination of care.  Discussion was focused on education regarding the in discussing the pathoetiology and anticipated clinical course of the above condition.  Specimen around the anatomic/congenital variability of the lumbar spine and sacral spine.  Discussed the importance of lumbar stabilization exercises.         Follow-up: Return in about 6 weeks (around 04/06/2017).    CMA/ATC served as Neurosurgeon  during this visit. History, Physical, and Plan performed by medical provider. Documentation and orders reviewed and attested to.      Gaspar Bidding, DO    [facility]

## 2017-03-01 ENCOUNTER — Encounter: Payer: Self-pay | Admitting: Sports Medicine

## 2017-03-01 ENCOUNTER — Ambulatory Visit: Payer: BLUE CROSS/BLUE SHIELD | Admitting: Sports Medicine

## 2017-03-03 NOTE — Telephone Encounter (Signed)
Discussed with the patient today.  We will plan to obtain MRI as outlined per office visit.  Okay to continue with therapeutic massage as needed but will otherwise plan for as needed.

## 2017-03-06 NOTE — Assessment & Plan Note (Addendum)
Patient symptoms are consistent with a S1 radiculitis.  Does appear that he may have a lumbarized S1 which may be contributing to increased mobility in this region.  He would like to start with an empiric diagnostic and therapeutic epidural steroid injection as well as begin on gabapentin.  Okay to continue with massage and therapeutic exercises previously begun.  >50% of this 25 minute visit spent in direct patient counseling and/or coordination of care.  Discussion was focused on education regarding the in discussing the pathoetiology and anticipated clinical course of the above condition.  Specimen around the anatomic/congenital variability of the lumbar spine and sacral spine.  Discussed the importance of lumbar stabilization exercises.

## 2017-03-22 ENCOUNTER — Encounter (INDEPENDENT_AMBULATORY_CARE_PROVIDER_SITE_OTHER): Payer: Self-pay | Admitting: Physical Medicine and Rehabilitation

## 2017-03-22 ENCOUNTER — Ambulatory Visit (INDEPENDENT_AMBULATORY_CARE_PROVIDER_SITE_OTHER): Payer: BLUE CROSS/BLUE SHIELD

## 2017-03-22 ENCOUNTER — Ambulatory Visit (INDEPENDENT_AMBULATORY_CARE_PROVIDER_SITE_OTHER): Payer: BLUE CROSS/BLUE SHIELD | Admitting: Physical Medicine and Rehabilitation

## 2017-03-22 VITALS — BP 131/83 | HR 72

## 2017-03-22 DIAGNOSIS — M5416 Radiculopathy, lumbar region: Secondary | ICD-10-CM | POA: Diagnosis not present

## 2017-03-22 DIAGNOSIS — M961 Postlaminectomy syndrome, not elsewhere classified: Secondary | ICD-10-CM

## 2017-03-22 MED ORDER — METHYLPREDNISOLONE ACETATE 80 MG/ML IJ SUSP
80.0000 mg | Freq: Once | INTRAMUSCULAR | Status: AC
  Administered 2017-03-22: 80 mg

## 2017-03-22 MED ORDER — LIDOCAINE HCL (PF) 1 % IJ SOLN
2.0000 mL | Freq: Once | INTRAMUSCULAR | Status: AC
  Administered 2017-03-22: 2 mL

## 2017-03-22 NOTE — Progress Notes (Signed)
Low back and left leg pain for 4 months. States low back pain is better but still having some leg pain at times. Numbness and tingling in left foot is his main complaint .Denies any leg weakness.

## 2017-03-22 NOTE — Patient Instructions (Signed)

## 2017-03-23 NOTE — Procedures (Signed)
Kyle Lawson is a very pleasant 61 year old gentleman followed by Dr. Gaspar BiddingMichael Rigby. He has been having essentially 4 months of low back and left posterior leg pain. He had no specific trauma. He is getting numbness and tingling in the left foot which is actually his biggest complaint more than the leg pain. He states his low back is actually better but he still having this pain and numbness into the foot. He has not had any weakness. He has a prior history of lumbar surgery by Dr. Wynetta Emeryram. He's had bilateral total hip arthroplasties. He has had [facility] past [facility]. Review of current x-rays show hypoplastic ribs and if he consider that T12 then he has a very low seated L5 vertebral body in the pelvis. If you count T12 as the more fully formed ribs that he essentially has a lumbarized S1 segment are almost L6. We are going to complete a diagnostic and hopefully therapeutic left L5-S1 intralaminar epidural steroid injection. I tried to answer all of his questions about transitional segments.  Lumbar Epidural Steroid Injection - Interlaminar Approach with Fluoroscopic Guidance  Patient: Kyle Lawson      Date of Birth: [birth year] MRN: [id number] PCP: System, Pcp Not In      Visit Date: 03/22/2017   Universal Protocol:    Date/Time: 03/24/1811:48 PM  Consent Given By: the patient  Position: PRONE  Additional Comments: Vital signs were monitored before and after the procedure. Patient was prepped and draped in the usual sterile fashion. The correct patient, procedure, and site was verified.   Injection Procedure Details:  Procedure Site One Meds Administered:  Meds ordered this encounter  Medications  . lidocaine (PF) (XYLOCAINE) 1 % injection 2 mL  . methylPREDNISolone acetate (DEPO-MEDROL) injection 80 mg     Laterality: Left  Location/Site:  L5-S1  Needle size: 20 G  Needle type: Tuohy  Needle Placement: Paramedian  epidural  Findings:  -Contrast Used: 0.5 mL iohexol 180 mg iodine/mL   -Comments: Excellent flow of contrast into the epidural space.  Procedure Details: Using a paramedian approach from the side mentioned above, the region overlying the inferior lamina was localized under fluoroscopic visualization and the soft tissues overlying this structure were infiltrated with 4 ml. of 1% Lidocaine without Epinephrine. The Tuohy needle was inserted into the epidural space using a paramedian approach.   The epidural space was localized using loss of resistance along with lateral and bi-planar fluoroscopic views.  After negative aspirate for air, blood, and CSF, a 2 ml. volume of Isovue-250 was injected into the epidural space and the flow of contrast was observed. Radiographs were obtained for documentation purposes.    The injectate was administered into the level noted above.   Additional Comments:  The patient tolerated the procedure well Dressing: Band-Aid    Post-procedure details: Patient was observed during the procedure. Post-procedure instructions were reviewed.  Patient left the clinic in stable condition.

## 2017-03-30 ENCOUNTER — Ambulatory Visit: Payer: BLUE CROSS/BLUE SHIELD | Admitting: Sports Medicine

## 2017-04-12 ENCOUNTER — Ambulatory Visit: Payer: BLUE CROSS/BLUE SHIELD | Admitting: Sports Medicine

## 2017-04-19 ENCOUNTER — Ambulatory Visit (INDEPENDENT_AMBULATORY_CARE_PROVIDER_SITE_OTHER): Payer: BLUE CROSS/BLUE SHIELD | Admitting: Sports Medicine

## 2017-04-19 ENCOUNTER — Encounter: Payer: Self-pay | Admitting: Sports Medicine

## 2017-04-19 VITALS — BP 120/84 | HR 74 | Ht 69.0 in | Wt 190.2 lb

## 2017-04-19 DIAGNOSIS — M5441 Lumbago with sciatica, right side: Secondary | ICD-10-CM

## 2017-04-19 DIAGNOSIS — M5442 Lumbago with sciatica, left side: Secondary | ICD-10-CM

## 2017-04-19 DIAGNOSIS — M47816 Spondylosis without myelopathy or radiculopathy, lumbar region: Secondary | ICD-10-CM | POA: Diagnosis not present

## 2017-04-19 NOTE — Assessment & Plan Note (Signed)
Persistent symptoms greater than 5 months.  Discussed gabapentin and he will trial this for Therapeutic and Diagnostic aspects. Given lack of improvement with ESI MRI indicated at this time. F/u after MRI Sx are only minimally interfering with day to day activities.

## 2017-04-19 NOTE — Patient Instructions (Signed)
Also check out the YouTube Video from Dr. Myles LippsEric Goodman.  I would like to see you try performing this 5-6 days per week.    A good intro video is: "Independence from Pain 7-minute Video" - [email or web address]   His more advanced video is: "Powerful Posture and Pain Relief: 12 minutes of Foundation Training" - [email or web address]://[email or web address].[email or web address]   Do not try to attempt this entire video when first beginning.    Try breaking of each exercise that he goes into shorter segments.  Otherwise if they perform an exercise for 45 seconds, start with 15 seconds and rest and then resume with a begin the new activity.  Work your way up to doing this 12 minute video and I expect to see significant improvements in your pain.   We are ordering an MRI for you today.  The imaging office will be calling you to schedule your appointment after we obtain authorization from your insurance company.   Please be sure you have signed up for MyChart so that we can get your results to you.  We will be in touch with you as soon as we can.  Please know, it can take up to 3-4 business days for the radiologist and Dr. Berline Choughigby to have time to review the results and determine the best appropriate action.  If there is something that appears to be surgical or needs a referral to other specialists we will let you know through MyChart or telephone.  Otherwise we will plan to schedule a follow up appointment with Dr. Berline Choughigby once we have the results.

## 2017-04-19 NOTE — Progress Notes (Signed)
OFFICE VISIT NOTE Veverly FellsMichael D. Delorise Shinerigby, DO   Sports Medicine [facility] [phone]  Kyle DunningJohn Lawson - 61 y.o. male MRN [id number]  Date of birth: [birth year]  Visit Date: 04/19/2017  PCP: System, Pcp Not In   Referred by: No ref. provider found  Kyle DakinBrandy Lawson, CMA acting as scribe for Kyle Lawson.  SUBJECTIVE:   Chief Complaint  Patient presents with  . Follow-up    bilateral low back pain with bilateral sciatica, lumbar spondylosis   HPI: As below and per problem based documentation when appropriate.  Mr, [name] is an established patient presenting today in follow-up of bilateral low back pain and bilateral sciatica and lumbar spondylosis.   Pt had epidural steroid injection 03/22/17 and reports that sx have not changed much. He has numbness and pain mostly in the left foot but a little in the right foot as well. He has been doing lumbar stabilization exercises with little trouble. Sx are worse when sitting, even more so for long periods of time. The exercises don't "light me up" like the used to. If he does additional exercises he feels like he gets set back. He says that he is generally uncomfortable most of the time. He had OMT in the past and got no relief. He is taking indomethacin but he has not started the Gabapentin because he has concerns about the side effects. He takes Ibuprofen and Tylenol as needed. He does get some mild relief when he lays down and applied ice to his back. When is first wakes up in the morning he doesn't feel the pain and tingling but within a couple of hours of being awake it starts to appear and seems to get worse throughout the day. He is beginning to get discourage because he is not getting any relief.     Review of Systems  Constitutional: Negative for chills and fever.  HENT: Negative.   Eyes: Negative.   Respiratory: Negative for shortness of breath and wheezing.   Cardiovascular: Negative for chest pain, palpitations  and leg swelling.  Gastrointestinal: Negative.   Genitourinary: Negative.   Musculoskeletal: Positive for myalgias. Negative for falls.  Skin: Negative.   Neurological: Positive for tingling.  Endo/Heme/Allergies: Does not bruise/bleed easily.  Psychiatric/Behavioral: Negative.     Otherwise per HPI.  HISTORY & PERTINENT PRIOR DATA:  No specialty comments available. He reports that he has never smoked. He has never used smokeless tobacco. No results for input(s): HGBA1C, LABURIC in the last 8760 hours. Medications & Allergies reviewed per EMR Patient Active Problem List   Diagnosis Date Noted  . Gout 02/05/2017  . Lumbar spondylosis 01/19/2017  . Acute bilateral low back pain with bilateral sciatica 01/19/2017   Past Medical History:  Diagnosis Date  . Arthritis   . GERD (gastroesophageal reflux disease)    occasional  . Gout   . Hypertension   . Kidney stone    No family history on file. Past Surgical History:  Procedure Laterality Date  . BACK SURGERY     1984 and 1996  . JOINT REPLACEMENT     left hip 09/2006  . TOTAL HIP ARTHROPLASTY  09/14/2011   Procedure: TOTAL HIP ARTHROPLASTY;  Surgeon: Loreta Aveaniel F Murphy, MD;  [facility];  Service: Orthopedics;  Laterality: Right;   Social History   Occupational History  . Not on file.   Social History Main Topics  . Smoking status: Never Smoker  . Smokeless tobacco: Never Used  . Alcohol  use Yes     Comment: occasionally  . Drug use: Unknown  . Sexual activity: Not on file    OBJECTIVE:  VS:  HT:5\' 9"  (175.3 cm)   WT:190 lb 3.2 oz (86.3 kg)  BMI:28.1    BP:120/84  HR:74bpm  TEMP: ( )  RESP:97 % EXAM: Findings:  Adult male.  No acute distress.  Alert and appropriate. Bilateral lower extremities overall well aligned.  No significant deformity.   He has no significant pain with straight leg raise at this time but it does cause slight pulling in his back that is reproducible and does correlate with the severe  symptoms that he will occasionally develop.  Lower extremity strength is overall well maintained.  Lower extremity reflexes are 2+/4.  He does have a slight dysesthesia in the L5/S1 distribution     Xr C-arm No Report  Result Date: 03/22/2017 Please see Notes or Procedures tab for imaging impression.  ASSESSMENT & PLAN:     ICD-10-CM   1. Lumbar spondylosis M47.816 MR Lumbar Spine Wo Contrast  2. Acute bilateral low back pain with bilateral sciatica M54.42 MR Lumbar Spine Wo Contrast   M54.41   ================================================================= Lumbar spondylosis Persistent symptoms greater than 5 months.  Discussed gabapentin and he will trial this for Therapeutic and Diagnostic aspects. Given lack of improvement with ESI MRI indicated at this time. F/u after MRI Sx are only minimally interfering with day to day activities.  ================================================================= Patient Instructions  Also check out the YouTube Video from Dr. Myles Lipps.  I would like to see you try performing this 5-6 days per week.    A good intro video is: "Independence from Pain 7-minute Video" - [email or web address]   His more advanced video is: "Powerful Posture and Pain Relief: 12 minutes of Foundation Training" - [email or web address]://[email or web address].[email or web address]   Do not try to attempt this entire video when first beginning.    Try breaking of each exercise that he goes into shorter segments.  Otherwise if they perform an exercise for 45 seconds, start with 15 seconds and rest and then resume with a begin the new activity.  Work your way up to doing this 12 minute video and I expect to see significant improvements in your pain.   We are ordering an MRI for you today.  The imaging office will be calling you to schedule your appointment after we obtain authorization from your insurance company.   Please be sure you have signed up for MyChart so that we can get your  results to you.  We will be in touch with you as soon as we can.  Please know, it can take up to 3-4 business days for the radiologist and Dr. Berline Chough to have time to review the results and determine the best appropriate action.  If there is something that appears to be surgical or needs a referral to other specialists we will let you know through MyChart or telephone.  Otherwise we will plan to schedule a follow up appointment with Dr. Berline Chough once we have the results.   ================================================================= No future appointments.  Follow-up: Return for MRI review.   CMA/ATC served as Neurosurgeon during this visit. History, Physical, and Plan performed by medical provider. Documentation and orders reviewed and attested to.      Gaspar Bidding, DO    [facility] Phys[facility]

## 2017-04-28 ENCOUNTER — Other Ambulatory Visit: Payer: Self-pay | Admitting: Sports Medicine

## 2017-04-28 DIAGNOSIS — M47816 Spondylosis without myelopathy or radiculopathy, lumbar region: Secondary | ICD-10-CM

## 2017-05-05 ENCOUNTER — Ambulatory Visit
Admission: RE | Admit: 2017-05-05 | Discharge: 2017-05-05 | Disposition: A | Discharge status: Home / Self Care | Payer: BLUE CROSS/BLUE SHIELD | Source: Ambulatory Visit | Attending: Sports Medicine | Admitting: Sports Medicine

## 2017-05-05 DIAGNOSIS — M47816 Spondylosis without myelopathy or radiculopathy, lumbar region: Secondary | ICD-10-CM

## 2017-05-05 DIAGNOSIS — M5442 Lumbago with sciatica, left side: Secondary | ICD-10-CM

## 2017-05-05 DIAGNOSIS — M5441 Lumbago with sciatica, right side: Secondary | ICD-10-CM

## 2017-05-05 DIAGNOSIS — M5126 Other intervertebral disc displacement, lumbar region: Secondary | ICD-10-CM | POA: Diagnosis not present

## 2017-05-09 ENCOUNTER — Other Ambulatory Visit: Payer: Self-pay

## 2017-05-09 DIAGNOSIS — M5441 Lumbago with sciatica, right side: Secondary | ICD-10-CM

## 2017-05-09 DIAGNOSIS — M5442 Lumbago with sciatica, left side: Principal | ICD-10-CM

## 2017-05-09 DIAGNOSIS — M47816 Spondylosis without myelopathy or radiculopathy, lumbar region: Secondary | ICD-10-CM

## 2017-05-10 ENCOUNTER — Encounter: Payer: Self-pay | Admitting: Sports Medicine

## 2017-05-10 ENCOUNTER — Ambulatory Visit (INDEPENDENT_AMBULATORY_CARE_PROVIDER_SITE_OTHER): Payer: BLUE CROSS/BLUE SHIELD | Admitting: Sports Medicine

## 2017-05-10 VITALS — BP 118/80 | HR 69 | Ht 69.0 in | Wt 190.6 lb

## 2017-05-10 DIAGNOSIS — M5442 Lumbago with sciatica, left side: Secondary | ICD-10-CM

## 2017-05-10 DIAGNOSIS — M5441 Lumbago with sciatica, right side: Secondary | ICD-10-CM

## 2017-05-10 DIAGNOSIS — M47816 Spondylosis without myelopathy or radiculopathy, lumbar region: Secondary | ICD-10-CM

## 2017-05-10 NOTE — Progress Notes (Signed)
[name] is an established patient presenting today for review of recent MRI. He had MRI L-spine 05/05/2017. He has been referred to Dr. Alvester MorinNewton for an epidural injection but would like to discuss results and treatment options today.   >50% of this 25 minute visit spent in direct patient counseling and/or coordination of care.  Discussion was focused on education regarding the in discussing the pathoetiology and anticipated clinical course of the above condition.  His MRI is reviewed in detail.  Ultimately his pain has remained in the left buttock radiating down the posterior aspect of his upper leg and then has a generalized dysesthesia in the entire left foot over the anterior aspect consistent with L5 radiculitis.  He has no focal weakness.  He has undergone a prior two-level laminectomy with fat transplant as well from his gluteal region and this was performed over 25 years ago.  He denies any significant pain along the anterior aspect of his thighs.  Comparing his current MRI to his prior MRIs he does have worsening at multiple levels of degenerative disc disease with worsening degenerative disc disease at L2-3, L3-4 and L4-5.  In discussing with him and reviewing the images I suspect that the left paracentral disc protrusion at L4-5 with mass-effect on the left intraspinal L5 nerve root is likely the cause of his current symptoms.  Unfortunately he had very minimal effect from the prior epidural with Dr. Alvester MorinNewton in July which was a paramedian approach L5-S1 interlaminar epidural steroid injection.  He is able to heel and toe walk without significant difficulty and his strength is excellent but he does have a generalized dysesthesia in the L5 distribution on the left.  I suspect that given the extensive changes in the lumbar spine and with how tight his lateral recesses are that the epidural previously performed was not able to adequately diffuse to the area that is currently causing him the most  symptoms.  We discussed that a referral back to Dr. Alvester MorinNewton for an alternative approach injection including consideration of the sacral hiatus injection with saline push.  He has noted some improvement while on gabapentin and we can continue to try to titrate this or even consider transition to Lyrica.  Additionally he was interested in alternative treatment options and information regarding referral to Dr. Mayford KnifeWilliams for chiropractic decompression therapy was discussed.  Recommend avoiding any type of lumbar HVLA/thrust techniques but segmental decompression therapy may have some added benefit although evidence is unclear on its efficacy.  We will plan to follow-up in 6 weeks after the next epidural  and check on his progress at that time.

## 2017-05-25 ENCOUNTER — Ambulatory Visit (INDEPENDENT_AMBULATORY_CARE_PROVIDER_SITE_OTHER): Payer: BLUE CROSS/BLUE SHIELD | Admitting: Physical Medicine and Rehabilitation

## 2017-05-25 ENCOUNTER — Ambulatory Visit (INDEPENDENT_AMBULATORY_CARE_PROVIDER_SITE_OTHER): Payer: BLUE CROSS/BLUE SHIELD

## 2017-05-25 ENCOUNTER — Encounter (INDEPENDENT_AMBULATORY_CARE_PROVIDER_SITE_OTHER): Payer: Self-pay | Admitting: Physical Medicine and Rehabilitation

## 2017-05-25 VITALS — BP 128/80 | HR 67 | Resp 99

## 2017-05-25 DIAGNOSIS — M961 Postlaminectomy syndrome, not elsewhere classified: Secondary | ICD-10-CM

## 2017-05-25 DIAGNOSIS — M5416 Radiculopathy, lumbar region: Secondary | ICD-10-CM | POA: Diagnosis not present

## 2017-05-25 DIAGNOSIS — M5116 Intervertebral disc disorders with radiculopathy, lumbar region: Secondary | ICD-10-CM

## 2017-05-25 MED ORDER — LIDOCAINE HCL (PF) 1 % IJ SOLN: 2.0000 mL | Freq: Once | INTRAMUSCULAR | Status: DC

## 2017-05-25 MED ORDER — BETAMETHASONE SOD PHOS & ACET 6 (3-3) MG/ML IJ SUSP
12.0000 mg | Freq: Once | INTRAMUSCULAR | Status: DC
Start: 2017-05-25 — End: 2017-07-04

## 2017-05-25 NOTE — Patient Instructions (Signed)

## 2017-05-25 NOTE — Progress Notes (Deleted)
Low back and left leg pain. Worse with prolonged sitting. Numbness and tingling in left foot which is his biggest complaint.

## 2017-06-04 NOTE — Procedures (Signed)
Kyle Lawson is a pleasant 61 year old gentleman with left hip and leg pain with tingling into the left foot in a pretty classic L5 distribution. He has failed conservative care to Dr. Berline Chough who since him here today for diagnostic and hopefully therapeutic left L5 transforaminal epidural steroid injection. Patient's had prior laminectomy and laminotomy. This was at the L4-5 region. He does have MRI with disc herniation paracentrally at L4-5 which would likely affect the L5 nerve root.The injection  will be diagnostic and hopefully therapeutic. The patient has failed conservative care including time, medications and activity modification.  Lumbosacral Transforaminal Epidural Steroid Injection - Sub-Pedicular Approach with Fluoroscopic Guidance  Patient: Kyle Lawson      Date of Birth: [birth year] MRN: [id number] PCP: Andrena Mews, DO      Visit Date: 05/25/2017   Universal Protocol:    Date/Time: 05/25/2017  Consent Given By: the patient  Position: PRONE  Additional Comments: Vital signs were monitored before and after the procedure. Patient was prepped and draped in the usual sterile fashion. The correct patient, procedure, and site was verified.   Injection Procedure Details:  Procedure Site One Meds Administered:  Meds ordered this encounter  Medications  . lidocaine (PF) (XYLOCAINE) 1 % injection 2 mL  . betamethasone acetate-betamethasone sodium phosphate (CELESTONE) injection 12 mg    Laterality: Left  Location/Site:  L5-S1  Needle size: 22 G  Needle type: Spinal  Needle Placement: Transforaminal  Findings:  -Contrast Used: 1 mL iohexol 180 mg iodine/mL   -Comments: Excellent flow of contrast along the nerve and into the epidural space.  Procedure Details: After squaring off the end-plates to get a true AP view, the C-arm was positioned so that an oblique view of the foramen as noted above was visualized. The target area is just inferior to the "nose of the scotty  dog" or sub pedicular. The soft tissues overlying this structure were infiltrated with 2-3 ml. of 1% Lidocaine without Epinephrine.  The spinal needle was inserted toward the target using a "trajectory" view along the fluoroscope beam.  Under AP and lateral visualization, the needle was advanced so it did not puncture dura and was located close the 6 O'Clock position of the pedical in AP tracterory. Biplanar projections were used to confirm position. Aspiration was confirmed to be negative for CSF and/or blood. A 1-2 ml. volume of Isovue-250 was injected and flow of contrast was noted at each level. Radiographs were obtained for documentation purposes.   After attaining the desired flow of contrast documented above, a 0.5 to 1.0 ml test dose of 0.25% Marcaine was injected into each respective transforaminal space.  The patient was observed for 90 seconds post injection.  After no sensory deficits were reported, and normal lower extremity motor function was noted,   the above injectate was administered so that equal amounts of the injectate were placed at each foramen (level) into the transforaminal epidural space.   Additional Comments:  The patient tolerated the procedure well Dressing: Band-Aid    Post-procedure details: Patient was observed during the procedure. Post-procedure instructions were reviewed.  Patient left the clinic in stable condition.

## 2017-06-16 ENCOUNTER — Ambulatory Visit: Payer: BLUE CROSS/BLUE SHIELD | Admitting: Sports Medicine

## 2017-07-04 ENCOUNTER — Ambulatory Visit (INDEPENDENT_AMBULATORY_CARE_PROVIDER_SITE_OTHER): Payer: BLUE CROSS/BLUE SHIELD | Admitting: Sports Medicine

## 2017-07-04 ENCOUNTER — Encounter: Payer: Self-pay | Admitting: Sports Medicine

## 2017-07-04 VITALS — BP 132/82 | HR 81 | Ht 69.0 in | Wt 190.6 lb

## 2017-07-04 DIAGNOSIS — M47812 Spondylosis without myelopathy or radiculopathy, cervical region: Secondary | ICD-10-CM

## 2017-07-04 DIAGNOSIS — M5442 Lumbago with sciatica, left side: Secondary | ICD-10-CM | POA: Diagnosis not present

## 2017-07-04 DIAGNOSIS — M542 Cervicalgia: Secondary | ICD-10-CM

## 2017-07-04 DIAGNOSIS — M5441 Lumbago with sciatica, right side: Secondary | ICD-10-CM

## 2017-07-04 DIAGNOSIS — M47816 Spondylosis without myelopathy or radiculopathy, lumbar region: Secondary | ICD-10-CM

## 2017-07-04 NOTE — Assessment & Plan Note (Signed)
He is overall improved.  Having only minimal radicular symptoms.  No appreciable red flag symptoms suggesting need for surgical intervention.  Okay to continue with gabapentin as needed and I am interested to see if this would help his underlying neck and shoulder symptoms.  He seemed to be doing better while on.  Okay to refill as needed

## 2017-07-04 NOTE — Progress Notes (Signed)
OFFICE VISIT NOTE Kyle Lawson. Kyle Lawson Sports Medicine [facility] at [facility] [phone]  Kyle Lawson - 61 y.o. male MRN [id number]  Date of birth: [birth year]  Visit Date: 07/04/2017  PCP: Andrena Mews, DO   Referred by: Andrena Mews, DO  Kyle Lawson PT, LAT, ATC acting as scribe for Dr. Berline Chough.  SUBJECTIVE:   Chief Complaint  Patient presents with  . Follow-up    Low back pain and B sciatica   HPI: As below and per problem based documentation when appropriate.  Kyle Lawson is an established pt presenting today for f/u of B low back pain and B LE sciatica.  Pt was last seen in our office on 05/10/17 and had an epidural injection w/ Dr. Alvester Morin on 05/25/17.  Pt states that the epidural "had an effect" but didn't solve his issue.  He states that the injection changed how his pain felt and "crystalized" the pain to his L glute and isolated the numbness to the L ant ankle.  He reports having less overall numbness, with no appreciable numbness in his R LE and occasional numbness in the L LE.  He notes that he feels about 40% improved.  He also reports having stopped taking the gabapentin.  He states that he has developed a new issue with that being neck and R shoulder pain x approximately one month.  He notes that he has been getting massages and this has helped slightly.  He notes that he has been having some N/T in his L deltoid associated w/ the neck and R shoulder pain.  He reports that he has switched to a stand-up desk about 6-8 weeks ago and wonders if this might be contributing to his new neck and R shoulder pain.    Review of Systems  Constitutional: Negative for chills, fever and weight loss.  HENT: Negative.   Eyes: Negative.   Respiratory: Negative for cough, shortness of breath and wheezing.   Cardiovascular: Negative for chest pain and palpitations.  Gastrointestinal: Negative for abdominal pain, heartburn and nausea.  Musculoskeletal:  Positive for back pain and neck pain. Negative for falls.  Neurological: Positive for tingling. Negative for dizziness and headaches.  Endo/Heme/Allergies: Does not bruise/bleed easily.  Psychiatric/Behavioral: Negative for depression. The patient has insomnia. The patient is not nervous/anxious.     Otherwise per HPI.  HISTORY & PERTINENT PRIOR DATA:  No specialty comments available. He reports that he has never smoked. He has never used smokeless tobacco. No results for input(s): HGBA1C, LABURIC, CREATINE in the last 8760 hours.  Invalid input(s): CR Allergies reviewed per EMR Prior to Admission medications   Medication Sig Start Date End Date Taking? Authorizing Provider  allopurinol (ZYLOPRIM) 300 MG tablet Take 300 mg by mouth every morning.      [provider]  colchicine 0.6 MG tablet  11/24/16   [provider]  indomethacin (INDOCIN) 50 MG capsule TAKE ONE CAPSULE BY MOUTH 3 TIMES A DAY WITH FOOD 01/10/17   [provider]  lisinopril (PRINIVIL,ZESTRIL) 40 MG tablet Take 40 mg by mouth every morning.      [provider]  simvastatin (ZOCOR) 20 MG tablet Take 20 mg by mouth every morning.      [provider]   Patient Active Problem List   Diagnosis Date Noted  . Neck pain 07/04/2017  . Gout 02/05/2017  . Lumbar spondylosis 01/19/2017  . Acute bilateral low back pain with bilateral  sciatica 01/19/2017   Past Medical History:  Diagnosis Date  . Arthritis   . GERD (gastroesophageal reflux disease)    occasional  . Gout   . Hypertension   . Kidney stone    No family history on file. Past Surgical History:  Procedure Laterality Date  . BACK SURGERY     1984 and 1996  . JOINT REPLACEMENT     left hip 09/2006  . TOTAL HIP ARTHROPLASTY  09/14/2011   Procedure: TOTAL HIP ARTHROPLASTY;  Surgeon: Loreta Aveaniel F Murphy, MD;  [facility];  Service: Orthopedics;  Laterality: Right;   Social History   Occupational History  . Not on  file.   Social History Main Topics  . Smoking status: Never Smoker  . Smokeless tobacco: Never Used  . Alcohol use Yes     Comment: occasionally  . Drug use: Unknown  . Sexual activity: Not on file    OBJECTIVE:  VS:  HT:5\' 9"  (175.3 cm)   WT:190 lb 9.6 oz (86.5 kg)  BMI:28.13    BP:132/82  HR:81bpm  TEMP: ( )  RESP:96 % EXAM: Findings:  Right neck with periscapular trigger points and slight rotational and side bending limitations.  Negative Spurling's.  No pain with arm squeeze test or brachial plexus squeeze.  Full overhead range of motion.  Rotator cuff strength intact.  Increased range of motion of the left hamstring compared to the right with slight 5- out of 5 weakness on the left hamstring compared to the right.  His other lower extreme strength is otherwise intact.    RADIOLOGY:  ASSESSMENT & PLAN:     ICD-10-CM   1. Lumbar spondylosis M47.816   2. Acute bilateral low back pain with bilateral sciatica M54.42    M54.41   3. Neck pain M54.2   4. Spondylosis of cervical region without myelopathy or radiculopathy M47.812    ================================================================= Neck pain Suspect underlying degenerative changes.  Should respond well to decompressive therapy with Dr. Mayford KnifeWilliams.  Will defer x-rays to him given no focal neurologic deficit and no red flag symptoms at this time.  Suspect and exam suggest underlying OA.  Acute bilateral low back pain with bilateral sciatica He is overall improved.  Having only minimal radicular symptoms.  No appreciable red flag symptoms suggesting need for surgical intervention.  Okay to continue with gabapentin as needed and I am interested to see if this would help his underlying neck and shoulder symptoms.  He seemed to be doing better while on.  Okay to refill as needed  >50% of this 25 minute visit spent in direct patient counseling and/or coordination of care.  Discussion was focused on education regarding  the in discussing the pathoetiology and anticipated clinical course of the above condition. ===============================================   Follow-up: Return in about 3 months (around 10/04/2017).   CMA/ATC served as Neurosurgeonscribe during this visit. History, Physical, and Plan performed by medical provider. Documentation and orders reviewed and attested to.      Gaspar BiddingMichael Rigby, DO    [facility]

## 2017-07-04 NOTE — Assessment & Plan Note (Signed)
Suspect underlying degenerative changes.  Should respond well to decompressive therapy with Dr. Mayford KnifeWilliams.  Will defer x-rays to him given no focal neurologic deficit and no red flag symptoms at this time.  Suspect and exam suggest underlying OA.

## 2017-08-24 DIAGNOSIS — M9905 Segmental and somatic dysfunction of pelvic region: Secondary | ICD-10-CM | POA: Diagnosis not present

## 2017-08-24 DIAGNOSIS — M50322 Other cervical disc degeneration at C5-C6 level: Secondary | ICD-10-CM | POA: Diagnosis not present

## 2017-08-24 DIAGNOSIS — M25552 Pain in left hip: Secondary | ICD-10-CM | POA: Diagnosis not present

## 2017-08-24 DIAGNOSIS — M9901 Segmental and somatic dysfunction of cervical region: Secondary | ICD-10-CM | POA: Diagnosis not present

## 2017-09-05 HISTORY — PX: OTHER SURGICAL HISTORY: SHX169

## 2017-09-11 DIAGNOSIS — M50322 Other cervical disc degeneration at C5-C6 level: Secondary | ICD-10-CM | POA: Diagnosis not present

## 2017-09-11 DIAGNOSIS — M9905 Segmental and somatic dysfunction of pelvic region: Secondary | ICD-10-CM | POA: Diagnosis not present

## 2017-09-11 DIAGNOSIS — M25552 Pain in left hip: Secondary | ICD-10-CM | POA: Diagnosis not present

## 2017-09-11 DIAGNOSIS — M9901 Segmental and somatic dysfunction of cervical region: Secondary | ICD-10-CM | POA: Diagnosis not present

## 2017-09-12 DIAGNOSIS — M9901 Segmental and somatic dysfunction of cervical region: Secondary | ICD-10-CM | POA: Diagnosis not present

## 2017-09-12 DIAGNOSIS — M50322 Other cervical disc degeneration at C5-C6 level: Secondary | ICD-10-CM | POA: Diagnosis not present

## 2017-09-12 DIAGNOSIS — M25552 Pain in left hip: Secondary | ICD-10-CM | POA: Diagnosis not present

## 2017-09-12 DIAGNOSIS — M9905 Segmental and somatic dysfunction of pelvic region: Secondary | ICD-10-CM | POA: Diagnosis not present

## 2017-09-14 DIAGNOSIS — M9905 Segmental and somatic dysfunction of pelvic region: Secondary | ICD-10-CM | POA: Diagnosis not present

## 2017-09-14 DIAGNOSIS — M9901 Segmental and somatic dysfunction of cervical region: Secondary | ICD-10-CM | POA: Diagnosis not present

## 2017-09-14 DIAGNOSIS — M50322 Other cervical disc degeneration at C5-C6 level: Secondary | ICD-10-CM | POA: Diagnosis not present

## 2017-09-14 DIAGNOSIS — M25552 Pain in left hip: Secondary | ICD-10-CM | POA: Diagnosis not present

## 2017-09-18 DIAGNOSIS — M25552 Pain in left hip: Secondary | ICD-10-CM | POA: Diagnosis not present

## 2017-09-18 DIAGNOSIS — M9901 Segmental and somatic dysfunction of cervical region: Secondary | ICD-10-CM | POA: Diagnosis not present

## 2017-09-18 DIAGNOSIS — M50322 Other cervical disc degeneration at C5-C6 level: Secondary | ICD-10-CM | POA: Diagnosis not present

## 2017-09-18 DIAGNOSIS — M9905 Segmental and somatic dysfunction of pelvic region: Secondary | ICD-10-CM | POA: Diagnosis not present

## 2017-09-19 DIAGNOSIS — M9905 Segmental and somatic dysfunction of pelvic region: Secondary | ICD-10-CM | POA: Diagnosis not present

## 2017-09-19 DIAGNOSIS — M25552 Pain in left hip: Secondary | ICD-10-CM | POA: Diagnosis not present

## 2017-09-19 DIAGNOSIS — M50322 Other cervical disc degeneration at C5-C6 level: Secondary | ICD-10-CM | POA: Diagnosis not present

## 2017-09-19 DIAGNOSIS — M9901 Segmental and somatic dysfunction of cervical region: Secondary | ICD-10-CM | POA: Diagnosis not present

## 2017-09-21 DIAGNOSIS — M9901 Segmental and somatic dysfunction of cervical region: Secondary | ICD-10-CM | POA: Diagnosis not present

## 2017-09-21 DIAGNOSIS — M9905 Segmental and somatic dysfunction of pelvic region: Secondary | ICD-10-CM | POA: Diagnosis not present

## 2017-09-21 DIAGNOSIS — M25552 Pain in left hip: Secondary | ICD-10-CM | POA: Diagnosis not present

## 2017-09-21 DIAGNOSIS — M50322 Other cervical disc degeneration at C5-C6 level: Secondary | ICD-10-CM | POA: Diagnosis not present

## 2017-09-26 DIAGNOSIS — M542 Cervicalgia: Secondary | ICD-10-CM | POA: Diagnosis not present

## 2017-09-26 DIAGNOSIS — M50122 Cervical disc disorder at C5-C6 level with radiculopathy: Secondary | ICD-10-CM | POA: Diagnosis not present

## 2017-09-29 DIAGNOSIS — M50122 Cervical disc disorder at C5-C6 level with radiculopathy: Secondary | ICD-10-CM | POA: Diagnosis not present

## 2017-09-29 DIAGNOSIS — M542 Cervicalgia: Secondary | ICD-10-CM | POA: Diagnosis not present

## 2017-10-02 DIAGNOSIS — M542 Cervicalgia: Secondary | ICD-10-CM | POA: Diagnosis not present

## 2017-10-02 DIAGNOSIS — M50122 Cervical disc disorder at C5-C6 level with radiculopathy: Secondary | ICD-10-CM | POA: Diagnosis not present

## 2017-10-05 DIAGNOSIS — M50122 Cervical disc disorder at C5-C6 level with radiculopathy: Secondary | ICD-10-CM | POA: Diagnosis not present

## 2017-10-05 DIAGNOSIS — M542 Cervicalgia: Secondary | ICD-10-CM | POA: Diagnosis not present

## 2017-10-10 DIAGNOSIS — M542 Cervicalgia: Secondary | ICD-10-CM | POA: Diagnosis not present

## 2017-10-10 DIAGNOSIS — M50122 Cervical disc disorder at C5-C6 level with radiculopathy: Secondary | ICD-10-CM | POA: Diagnosis not present

## 2017-10-12 DIAGNOSIS — M50122 Cervical disc disorder at C5-C6 level with radiculopathy: Secondary | ICD-10-CM | POA: Diagnosis not present

## 2017-10-12 DIAGNOSIS — M542 Cervicalgia: Secondary | ICD-10-CM | POA: Diagnosis not present

## 2017-10-18 DIAGNOSIS — M50122 Cervical disc disorder at C5-C6 level with radiculopathy: Secondary | ICD-10-CM | POA: Diagnosis not present

## 2017-10-18 DIAGNOSIS — M542 Cervicalgia: Secondary | ICD-10-CM | POA: Diagnosis not present

## 2017-10-24 DIAGNOSIS — M50122 Cervical disc disorder at C5-C6 level with radiculopathy: Secondary | ICD-10-CM | POA: Diagnosis not present

## 2017-10-24 DIAGNOSIS — M542 Cervicalgia: Secondary | ICD-10-CM | POA: Diagnosis not present

## 2017-10-26 DIAGNOSIS — M50122 Cervical disc disorder at C5-C6 level with radiculopathy: Secondary | ICD-10-CM | POA: Diagnosis not present

## 2017-10-26 DIAGNOSIS — M542 Cervicalgia: Secondary | ICD-10-CM | POA: Diagnosis not present

## 2017-10-31 DIAGNOSIS — M50122 Cervical disc disorder at C5-C6 level with radiculopathy: Secondary | ICD-10-CM | POA: Diagnosis not present

## 2017-10-31 DIAGNOSIS — M542 Cervicalgia: Secondary | ICD-10-CM | POA: Diagnosis not present

## 2017-11-07 DIAGNOSIS — M50122 Cervical disc disorder at C5-C6 level with radiculopathy: Secondary | ICD-10-CM | POA: Diagnosis not present

## 2017-11-07 DIAGNOSIS — M542 Cervicalgia: Secondary | ICD-10-CM | POA: Diagnosis not present

## 2017-11-08 ENCOUNTER — Ambulatory Visit: Payer: BLUE CROSS/BLUE SHIELD | Admitting: Sports Medicine

## 2017-11-08 ENCOUNTER — Encounter: Payer: Self-pay | Admitting: Sports Medicine

## 2017-11-08 VITALS — BP 134/86 | HR 76 | Ht 69.0 in | Wt 187.4 lb

## 2017-11-08 DIAGNOSIS — M5441 Lumbago with sciatica, right side: Secondary | ICD-10-CM | POA: Diagnosis not present

## 2017-11-08 DIAGNOSIS — M542 Cervicalgia: Secondary | ICD-10-CM | POA: Diagnosis not present

## 2017-11-08 DIAGNOSIS — M47812 Spondylosis without myelopathy or radiculopathy, cervical region: Secondary | ICD-10-CM | POA: Diagnosis not present

## 2017-11-08 DIAGNOSIS — M47816 Spondylosis without myelopathy or radiculopathy, lumbar region: Secondary | ICD-10-CM

## 2017-11-08 DIAGNOSIS — M25511 Pain in right shoulder: Secondary | ICD-10-CM

## 2017-11-08 DIAGNOSIS — M5442 Lumbago with sciatica, left side: Secondary | ICD-10-CM

## 2017-11-08 MED ORDER — PREDNISONE 20 MG PO TABS: ORAL_TABLET | ORAL | 0 refills | Status: DC

## 2017-11-08 NOTE — Progress Notes (Signed)
Kyle Lawson. Kyle Lawson Sports Medicine [facility] at [facility] [phone]  Kyle Lawson - 62 y.o. male MRN [id number]  Date of birth: 12/05/1955  Visit Date: 11/08/2017  PCP: Andrena Mews, DO   Referred by: Andrena Mews, DO   Scribe for today's visit: Stevenson Clinch, CMA     SUBJECTIVE:  Kyle Lawson is here for No chief complaint on file.  01/19/17: Pt presents today for chronic low back pain and tingling in left foot Tingling in the left foot started about 8 weeks ago and comes and goes.  Pt has chronic back pain, hx of 2 surgeries in 1984 and 1996. 8 weeks ago he was doing yard work and his back started to spasm. He has had trouble with radiculopathy in the past. The pain is described as tingling and numbess and is rated as 4/10-6/10. Worsened with sitting for long periods of time Improves with getting up and moving around Therapies tried include massage therapy, stretching, Ibuprofen, Acetaminophen Other associated symptoms include: Pt denies current pain in left leg and back pain has improved. He did have some warmth in the left calf which subsided about 1-2 weeks ago.  Pt denies fever, chills, night sweats  02/23/17: Kyle Lawson reports improvement in his lower back pain but no so much in his left foot--persistent numbness/tingling. Osteopathic Manipulation was performed during last visit with no relief. He has completed a Medrol dose pack which gave him mild relief. Taking Indomethacin 50 mg prn. He is consistent with the home therapeutic exercises. Pt has been working with a massage therapist for sx as well but no sure if there is any improvment. He does get relief when laying down and icing the foot. Had one episode of severe sx while sleeping--took a left over Vicodin at that point with relief. Sitting for long periods of time, hamstring stretching, hip abduction exercises triggers is sx. Being Gloris Ham gives him the most relief. He is in the process of  getting a stand up desk at work to see if that helps.   04/19/17: Kyle Lawson, Kyle Lawson is an established patient presenting today in follow-up of bilateral low back pain and bilateral sciatica and lumbar spondylosis.  Pt had epidural steroid injection 03/22/17 and reports that sx have not changed much. He has numbness and pain mostly in the left foot but a little in the right foot as well. He has been doing lumbar stabilization exercises with little trouble. Sx are worse when sitting, even more so for long periods of time. The exercises don't "light me up" like the used to. If he does additional exercises he feels like he gets set back. He says that he is generally uncomfortable most of the time. He had OMT in the past and got no relief. He is taking indomethacin but he has not started the Gabapentin because he has concerns about the side effects. He takes Ibuprofen and Tylenol as needed. He does get some mild relief when he lays down and applied ice to his back. When is first wakes up in the morning he doesn't feel the pain and tingling but within a couple of hours of being awake it starts to appear and seems to get worse throughout the day. He is beginning to get discourage because he is not getting any relief.   07/04/17: Kyle Lawson. Kyle Lawson is an established pt presenting today for f/u of B low back pain and B LE sciatica.  Pt was last seen in our office  on 05/10/17 and had an epidural injection w/ Dr. Alvester Morin on 05/25/17.  Pt states that the epidural "had an effect" but didn't solve his issue.  He states that the injection changed how his pain felt and "crystalized" the pain to his L glute and isolated the numbness to the L ant ankle.  He reports having less overall numbness, with no appreciable numbness in his R LE and occasional numbness in the L LE.  He notes that he feels about 40% improved.  He also reports having stopped taking the gabapentin. He states that he has developed a new issue with that being neck and R  shoulder pain x approximately one month.  He notes that he has been getting massages and this has helped slightly.  He notes that he has been having some N/T in his L deltoid associated w/ the neck and R shoulder pain.  He reports that he has switched to a stand-up desk about 6-8 weeks ago and wonders if this might be contributing to his new neck and R shoulder pain.  11/08/17: Compared to the last office visit, his previously described symptoms were improving until recently Current symptoms are mild & are radiating to L left/foot He stopped taking Gabapentin in October 2018.  Compared to the last office visit, his previously described neck and shoulder symptoms are worsening, he is now waking up with HA daily.  Current symptoms are moderate-severe & are radiating to R shoulder He has tried dry needling a few times and got the most relief from that.    ROS Denies night time disturbances. Denies fevers, chills, or night sweats. Denies unexplained weight loss. Denies personal history of cancer. Denies changes in bowel or bladder habits. Denies recent unreported falls. Denies new or worsening dyspnea or wheezing. Reports headaches .  Reports numbness, tingling or weakness in the extremities.  Denies dizziness or presyncopal episodes Denies lower extremity edema    HISTORY & PERTINENT PRIOR DATA:  Prior History reviewed and updated per electronic medical record.  Significant history, findings, studies and interim changes include:  reports that  has never smoked. he has never used smokeless tobacco. No results for input(s): HGBA1C, LABURIC, CREATINE in the last 8760 hours. No specialty comments available. Problem  Right Anterior Shoulder Pain    OBJECTIVE:  VS:  HT:5\' 9"  (175.3 cm)   WT:187 lb 6.4 oz (85 kg)  BMI:27.66    BP:134/86  HR:76bpm  TEMP: ( )  RESP:94 %   PHYSICAL EXAM: Constitutional: WDWN, Non-toxic appearing. Psychiatric: Alert & appropriately interactive.  Not  depressed or anxious appearing. Respiratory: No increased work of breathing.  Trachea Midline Eyes: Pupils are equal.  EOM intact without nystagmus.  No scleral icterus  NEUROVASCULAR exam: No clubbing or cyanosis appreciated No significant venous stasis changes Capillary Refill: normal, less than 2 seconds   Neck and shoulder: Overall his neck is well aligned he does have slight straightening of the cervical lordosis.  He has mild pain with Spurling's compression test to the contralateral side as well as ipsilateral.  No radicular symptoms with Spurling's or Lhermitte's compression.  Upper extremity strength is 5 out of 5 but he does have a slight discrepancy right and left with triceps extension as well as internal rotation.  He has a small amount of pain with internal rotation that is resistant as well of the shoulder but this is minimal.  Overhead shoulder motion is good.  He has no significant scapular winging or scapular dyskinesis.  He  has trigger points and tenderness within the trapezius as well as levator and scalenes.  Upper extremity strength is otherwise 5/5 and upper extremity flexors are symmetric bilaterally.  No significant dermatomal dysesthesia.  Negative straight leg raise.  He has a normal gait  ASSESSMENT & PLAN:   1. Neck pain   2. Spondylosis of cervical region without myelopathy or radiculopathy   3. Lumbar spondylosis   4. Acute bilateral low back pain with bilateral sciatica   5. Right anterior shoulder pain    ++++++++++++++++++++++++++++++++++++++++++++ Orders & Meds: No orders of the defined types were placed in this encounter.   Meds ordered this encounter  Medications  . predniSONE (DELTASONE) 20 MG tablet    Sig: Take 3tabs PO QAM x3days, 2tabs PO QAM x3days, 1tab PO QAM x4days, 0.5tab PO QAM X4days    Dispense:  21 tablet    Refill:  0    ++++++++++++++++++++++++++++++++++++++++++++ PLAN:    Right anterior shoulder pain Nonfocal findings on exam  however he has markedly tight trapezius as well as some pain with cervical sidebending and rotation.  Most focal weaknesses with internal rotation and he does have some weakness with this but does not reproduce exact symptoms that he is complaining of.  Symptoms are not true radicular nature given the fact they do not radiate down past the shoulder and the likelihood of this being a true nerve impingement is low.  Would like to start him on a steroid taper over 14 days given that he has had some improvement with anti-inflammatories.  I suspect this is mainly coming from facet mediated pain in his cervical spine but if any lack of improvement MRI of the cervical spine would be indicated given the underlying cervical spondylosis seen on [facility].  Likelihood of this being surgical in nature is low but if any lack of improvement after 2 weeks MRI will be obtained and he will plan to call us with a report.  >50% of this 25 minute visit spent in direct patient counseling and/or coordination of care.  Discussion was focused on education regarding the in discussing the pathoetiology and anticipated clinical course of the above condition.   Follow-up: Return in about 4 weeks (around 12/06/2017).   Pertinent documentation may be included in additional procedure notes, imaging studies, problem based documentation and patient instructions. Please see these sections of the encounter for additional information regarding this visit. CMA/ATC served as Neurosurgeonscribe during this visit. History, Physical, and Plan performed by medical provider. Documentation and orders reviewed and attested to.      Andrena MewsMichael D Rigby, DO    [facility]

## 2017-11-08 NOTE — Patient Instructions (Signed)
Please give us a call in 2 weeks to let us know how you're doing and can consider an MRI at that time if needed.

## 2017-11-08 NOTE — Assessment & Plan Note (Signed)
Nonfocal findings on exam however he has markedly tight trapezius as well as some pain with cervical sidebending and rotation.  Most focal weaknesses with internal rotation and he does have some weakness with this but does not reproduce exact symptoms that he is complaining of.  Symptoms are not true radicular nature given the fact they do not radiate down past the shoulder and the likelihood of this being a true nerve impingement is low.  Would like to start him on a steroid taper over 14 days given that he has had some improvement with anti-inflammatories.  I suspect this is mainly coming from facet mediated pain in his cervical spine but if any lack of improvement MRI of the cervical spine would be indicated given the underlying cervical spondylosis [facility].  Likelihood of this being surgical in nature is low but if any lack of improvement after 2 weeks MRI will be obtained and he will plan to call us with a report.  >50% of this 25 minute visit spent in direct patient counseling and/or coordination of care.  Discussion was focused on education regarding the in discussing the pathoetiology and anticipated clinical course of the above condition.

## 2017-11-17 ENCOUNTER — Other Ambulatory Visit: Payer: Self-pay | Admitting: Physical Therapy

## 2017-11-17 ENCOUNTER — Encounter: Payer: Self-pay | Admitting: Sports Medicine

## 2017-11-17 DIAGNOSIS — G8929 Other chronic pain: Secondary | ICD-10-CM

## 2017-11-17 DIAGNOSIS — M542 Cervicalgia: Principal | ICD-10-CM

## 2017-12-02 ENCOUNTER — Ambulatory Visit
Admission: RE | Admit: 2017-12-02 | Discharge: 2017-12-02 | Disposition: A | Discharge status: Home / Self Care | Payer: BLUE CROSS/BLUE SHIELD | Source: Ambulatory Visit | Attending: Sports Medicine | Admitting: Sports Medicine

## 2017-12-02 DIAGNOSIS — M4802 Spinal stenosis, cervical region: Secondary | ICD-10-CM | POA: Diagnosis not present

## 2017-12-02 DIAGNOSIS — M542 Cervicalgia: Principal | ICD-10-CM

## 2017-12-02 DIAGNOSIS — G8929 Other chronic pain: Secondary | ICD-10-CM

## 2017-12-06 ENCOUNTER — Encounter: Payer: Self-pay | Admitting: Sports Medicine

## 2017-12-06 ENCOUNTER — Ambulatory Visit: Payer: BLUE CROSS/BLUE SHIELD | Admitting: Sports Medicine

## 2017-12-06 VITALS — BP 114/80 | HR 74 | Ht 69.0 in | Wt 189.4 lb

## 2017-12-06 DIAGNOSIS — M47812 Spondylosis without myelopathy or radiculopathy, cervical region: Secondary | ICD-10-CM

## 2017-12-06 DIAGNOSIS — M542 Cervicalgia: Secondary | ICD-10-CM

## 2017-12-06 DIAGNOSIS — M25511 Pain in right shoulder: Secondary | ICD-10-CM | POA: Diagnosis not present

## 2017-12-06 DIAGNOSIS — G8929 Other chronic pain: Secondary | ICD-10-CM

## 2017-12-06 NOTE — Progress Notes (Signed)
Veverly FellsMichael D. Delorise Shinerigby, DO  [facility] [place] [phone]  Kyle DunningJohn Lawson - 62 y.o. male MRN [id number]  Date of birth: 1956-08-11  Visit Date: 12/06/2017  PCP: Ozella RocksMerrell, David J, MD   Referred by: Andrena Mewsigby, Bodin Gorka D, DO  Scribe for today's visit: Stevenson ClinchBrandy Coleman, CMA     SUBJECTIVE:  Kyle DunningJohn Lawson is here for Follow-up (neck and R shoulder pain)  01/19/17: Pt presents today for chronic low back pain and tingling in left foot Tingling in the left foot started about 8 weeks ago and comes and goes.  Pt has chronic back pain, hx of 2 surgeries in 1984 and 1996. 8 weeks ago he was doing yard work and his back started to spasm. He has had trouble with radiculopathy in the past. The pain is described as tingling and numbess and is rated as 4/10-6/10. Worsened with sitting for long periods of time Improves with getting up and moving around Therapies tried include massage therapy, stretching, Ibuprofen, Acetaminophen Other associated symptoms include: Pt denies current pain in left leg and back pain has improved. He did have some warmth in the left calf which subsided about 1-2 weeks ago.  Pt denies fever, chills, night sweats  02/23/17: Kyle Lawson reports improvement in his lower back pain but no so much in his left foot--persistent numbness/tingling. Osteopathic Manipulation was performed during last visit with no relief. He has completed a Medrol dose pack which gave him mild relief. Taking Indomethacin 50 mg prn. He is consistent with the home therapeutic exercises. Pt has been working with a massage therapist for sx as well but no sure if there is any improvment. He does get relief when laying down and icing the foot. Had one episode of severe sx while sleeping--took a left over Vicodin at that point with relief. Sitting for long periods of time, hamstring stretching, hip abduction exercises triggers is sx. Being [name] gives him the most relief. He is in the process  of getting a stand up desk at work to see if that helps.   04/19/17: Kyle Lawson, Kyle Lawson is an established patient presenting today in follow-up of bilateral low back pain and bilateral sciatica and lumbar spondylosis.  Pt had epidural steroid injection 03/22/17 and reports that sx have not changed much. He has numbness and pain mostly in the left foot but a little in the right foot as well. He has been doing lumbar stabilization exercises with little trouble. Sx are worse when sitting, even more so for long periods of time. The exercises don't "light me up" like the used to. If he does additional exercises he feels like he gets set back. He says that he is generally uncomfortable most of the time. He had OMT in the past and got no relief. He is taking indomethacin but he has not started the Gabapentin because he has concerns about the side effects. He takes Ibuprofen and Tylenol as needed. He does get some mild relief when he lays down and applied ice to his back. When is first wakes up in the morning he doesn't feel the pain and tingling but within a couple of hours of being awake it starts to appear and seems to get worse throughout the day. He is beginning to get discourage because he is not getting any relief.   07/04/17: Kyle Lawson. Kyle Lawson is an established pt presenting today for f/u of B low back pain and B LE sciatica.  Pt was last seen in our office  on 05/10/17 and had an epidural injection w/ Dr. Alvester Morin on 05/25/17.  Pt states that the epidural "had an effect" but didn't solve his issue.  He states that the injection changed how his pain felt and "crystalized" the pain to his L glute and isolated the numbness to the L ant ankle.  He reports having less overall numbness, with no appreciable numbness in his R LE and occasional numbness in the L LE.  He notes that he feels about 40% improved.  He also reports having stopped taking the gabapentin. He states that he has developed a new issue with that being neck and R  shoulder pain x approximately one month.  He notes that he has been getting massages and this has helped slightly.  He notes that he has been having some N/T in his L deltoid associated w/ the neck and R shoulder pain.  He reports that he has switched to a stand-up desk about 6-8 weeks ago and wonders if this might be contributing to his new neck and R shoulder pain.  11/08/17: Compared to the last office visit, his previously described symptoms were improving until recently Current symptoms are mild & are radiating to L left/foot He stopped taking Gabapentin in October 2018. Compared to the last office visit, his previously described neck and shoulder symptoms are worsening, he is now waking up with HA daily.  Current symptoms are moderate-severe & are radiating to R shoulder He has tried dry needling a few times and got the most relief from that.   12/06/2017: Compared to the last office visit, his previously described symptoms show no change. Current symptoms are moderate & are radiating to R shoulder.  He has been taking Gabapentin BID-TID with minimal relief.   ROS Denies night time disturbances. Denies fevers, chills, or night sweats. Denies unexplained weight loss. Denies personal history of cancer. Denies changes in bowel or bladder habits. Denies recent unreported falls. Denies new or worsening dyspnea or wheezing. Denies headaches or dizziness.  Reports numbness, tingling or weakness  In the extremities.  Denies dizziness or presyncopal episodes Denies lower extremity edema    HISTORY & PERTINENT PRIOR DATA:  Prior History reviewed and updated per electronic medical record.  Significant/pertinent history, findings, studies include:  reports that he has never smoked. He has never used smokeless tobacco. No results for input(s): HGBA1C, LABURIC, CREATINE in the last 8760 hours. No specialty comments available. No problems updated.  OBJECTIVE:  VS:  HT:5\' 9"  (175.3 cm)    WT:189 lb 6.4 oz (85.9 kg)  BMI:27.96    BP:114/80  HR:74bpm  TEMP: ( )  RESP:95 %   PHYSICAL EXAM: Constitutional: WDWN, Non-toxic appearing. Psychiatric: Alert & appropriately interactive.  Not depressed or anxious appearing. Respiratory: No increased work of breathing.  Trachea Midline Eyes: Pupils are equal.  EOM intact without nystagmus.  No scleral icterus  Vascular Exam: warm to touch no edema  upper extremity neuro exam: Does have a small amount of pain with brachial plexus squeeze and arm squeeze test as well as Spurling's compression test that does reproduce the majority of his symptoms.    ASSESSMENT & PLAN:   1. Neck pain, chronic   2. Neck pain   3. Right anterior shoulder pain   4. Spondylosis of cervical region without myelopathy or radiculopathy     PLAN: Results of his MRI were reviewed today and we ultimately will refer him to physiatry for an epidural steroid injection for cervical spine given  the persistent symptoms.  Does have issues with this low back as well but this is much less bothersome than his current neck and arm symptoms.    Follow-up: Return in about 8 weeks (around 01/31/2018).      Please see additional documentation for Objective, Assessment and Plan sections. Pertinent additional documentation may be included in corresponding procedure notes, imaging studies, problem based documentation and patient instructions. Please see these sections of the encounter for additional information regarding this visit.  CMA/ATC served as Neurosurgeon during this visit. History, Physical, and Plan performed by medical provider. Documentation and orders reviewed and attested to.      Andrena Mews, DO    [facility]

## 2017-12-21 ENCOUNTER — Ambulatory Visit (INDEPENDENT_AMBULATORY_CARE_PROVIDER_SITE_OTHER): Payer: BLUE CROSS/BLUE SHIELD | Admitting: Physical Medicine and Rehabilitation

## 2017-12-21 ENCOUNTER — Encounter (INDEPENDENT_AMBULATORY_CARE_PROVIDER_SITE_OTHER): Payer: Self-pay | Admitting: Physical Medicine and Rehabilitation

## 2017-12-21 ENCOUNTER — Ambulatory Visit (INDEPENDENT_AMBULATORY_CARE_PROVIDER_SITE_OTHER): Payer: BLUE CROSS/BLUE SHIELD

## 2017-12-21 VITALS — BP 130/90 | HR 75 | Temp 98.3°F

## 2017-12-21 DIAGNOSIS — M5412 Radiculopathy, cervical region: Secondary | ICD-10-CM | POA: Diagnosis not present

## 2017-12-21 DIAGNOSIS — M5010 Cervical disc disorder with radiculopathy, unspecified cervical region: Secondary | ICD-10-CM

## 2017-12-21 MED ORDER — METHYLPREDNISOLONE ACETATE 80 MG/ML IJ SUSP
80.0000 mg | Freq: Once | INTRAMUSCULAR | Status: AC
  Administered 2017-12-21: 80 mg

## 2017-12-21 NOTE — Patient Instructions (Signed)

## 2017-12-21 NOTE — Progress Notes (Signed)
 .  Numeric Pain Rating Scale and Functional Assessment Average Pain 6   In the last MONTH (on 0-10 scale) has pain interfered with the following?  1. General activity like being  able to carry out your everyday physical activities such as walking, climbing stairs, carrying groceries, or moving a chair?  Rating(3)   +Driver, -BT, -Dye Allergies.  

## 2018-01-01 NOTE — Procedures (Signed)
Cervical Epidural Steroid Injection - Interlaminar Approach with Fluoroscopic Guidance  Patient: Kyle Lawson      Date of Birth: 1956/03/06 MRN: [id number] PCP: [name], MD      Visit Date: 12/21/2017   Universal Protocol:    Date/Time: 04/29/195:46 AM  Consent Given By: the patient  Position: PRONE  Additional Comments: Vital signs were monitored before and after the procedure. Patient was prepped and draped in the usual sterile fashion. The correct patient, procedure, and site was verified.   Injection Procedure Details:  Procedure Site One Meds Administered:  Meds ordered this encounter  Medications  . methylPREDNISolone acetate (DEPO-MEDROL) injection 80 mg     Laterality: Right  Location/Site: C7-T1  Needle size: 20 G  Needle type: Touhy  Needle Placement: Paramedian epidural space  Findings:  -Comments: Excellent flow of contrast into the epidural space.  Procedure Details: Using a paramedian approach from the side mentioned above, the region overlying the inferior lamina was localized under fluoroscopic visualization and the soft tissues overlying this structure were infiltrated with 4 ml. of 1% Lidocaine without Epinephrine. A # 20 gauge, Tuohy needle was inserted into the epidural space using a paramedian approach.  The epidural space was localized using loss of resistance along with lateral and contralateral oblique bi-planar fluoroscopic views.  After negative aspirate for air, blood, and CSF, a 2 ml. volume of Isovue-250 was injected into the epidural space and the flow of contrast was observed. Radiographs were obtained for documentation purposes.   The injectate was administered into the level noted above.  Additional Comments:  The patient tolerated the procedure well Dressing: Band-Aid    Post-procedure details: Patient was observed during the procedure. Post-procedure instructions were reviewed.  Patient left the clinic in stable  condition.

## 2018-01-01 NOTE — Progress Notes (Signed)
Kyle Lawson - 62 y.o. male MRN [id number]  Date of birth: Feb 20, 1956  Office Visit Note: Visit Date: 12/21/2017 PCP: [name], MD Referred by: [name], MD  Subjective: Chief Complaint  Patient presents with  . Neck - Pain  . Right Shoulder - Pain  . Right Upper Arm - Numbness   HPI: Kyle Lawson is a 62 year old right-hand-dominant gentleman who have seen in the past through Dr. Berline Chough for lumbar spine injection.  He comes in today with chronic worsening right radicular arm pain with paresthesia.  MRI evidence of broad disc protrusion at C4-5 more right-sided and at C5-6 with left-sided facet arthropathy and uncovertebral joint hypertrophy with severe foraminal stenosis on the left but no left-sided complaints.  He is failed conservative care otherwise we are going to provide a diagnostic and hopefully therapeutic right C7-T1 interlaminar epidural steroid injection.   ROS Otherwise per HPI.  Assessment & Plan: Visit Diagnoses:  1. Cervical radiculopathy   2. Cervical disc disorder with radiculopathy     Plan: No additional findings.   Meds & Orders:  Meds ordered this encounter  Medications  . methylPREDNISolone acetate (DEPO-MEDROL) injection 80 mg    Orders Placed This Encounter  Procedures  . XR C-ARM NO REPORT  . Epidural Steroid injection    Follow-up: Return if symptoms worsen or fail to improve, for Dr. Berline Chough.   Procedures: No procedures performed  Cervical Epidural Steroid Injection - Interlaminar Approach with Fluoroscopic Guidance  Patient: Kyle Lawson      Date of Birth: 1955/11/19 MRN: [id number] PCP: [name], MD      Visit Date: 12/21/2017   Universal Protocol:    Date/Time: 04/29/195:46 AM  Consent Given By: the patient  Position: PRONE  Additional Comments: Vital signs were monitored before and after the procedure. Patient was prepped and draped in the usual sterile fashion. The correct patient, procedure, and site was  verified.   Injection Procedure Details:  Procedure Site One Meds Administered:  Meds ordered this encounter  Medications  . methylPREDNISolone acetate (DEPO-MEDROL) injection 80 mg     Laterality: Right  Location/Site: C7-T1  Needle size: 20 G  Needle type: Touhy  Needle Placement: Paramedian epidural space  Findings:  -Comments: Excellent flow of contrast into the epidural space.  Procedure Details: Using a paramedian approach from the side mentioned above, the region overlying the inferior lamina was localized under fluoroscopic visualization and the soft tissues overlying this structure were infiltrated with 4 ml. of 1% Lidocaine without Epinephrine. A # 20 gauge, Tuohy needle was inserted into the epidural space using a paramedian approach.  The epidural space was localized using loss of resistance along with lateral and contralateral oblique bi-planar fluoroscopic views.  After negative aspirate for air, blood, and CSF, a 2 ml. volume of Isovue-250 was injected into the epidural space and the flow of contrast was observed. Radiographs were obtained for documentation purposes.   The injectate was administered into the level noted above.  Additional Comments:  The patient tolerated the procedure well Dressing: Band-Aid    Post-procedure details: Patient was observed during the procedure. Post-procedure instructions were reviewed.  Patient left the clinic in stable condition.   Clinical History: MRI CERVICAL SPINE WITHOUT CONTRAST  TECHNIQUE: Multiplanar, multisequence MR imaging of the cervical spine was performed. No intravenous contrast was administered.  COMPARISON:  None.  FINDINGS: Alignment: Physiologic.  Vertebrae: No fracture, evidence of discitis, or bone lesion.  Cord: Normal signal and morphology.  Posterior Fossa, vertebral arteries, paraspinal tissues: Posterior fossa demonstrates no focal abnormality. Vertebral artery flow voids are  maintained. Paraspinal soft tissues are unremarkable.  Disc levels:  Disc spaces: Degenerative disc disease with disc height loss at C5-6.  C2-3: No significant disc bulge. No neural foraminal stenosis. No central canal stenosis.  C3-4: Small right paracentral disc protrusion. No neural foraminal stenosis. No central canal stenosis.  C4-5: Eccentric right broad-based disc bulge. Moderate right facet arthropathy. Moderate right foraminal stenosis. No left foraminal stenosis. No central canal stenosis.  C5-6: Broad-based disc bulge. Left uncovertebral degenerative changes and mild left facet arthropathy. Severe left foraminal stenosis. Moderate right foraminal stenosis. No central canal stenosis.  C6-7: Mild broad-based disc bulge. No neural foraminal stenosis. No central canal stenosis.  C7-T1: No significant disc bulge. No neural foraminal stenosis. No central canal stenosis.  IMPRESSION: 1. At C4-5 there is an eccentric right broad-based disc bulge. Moderate right facet arthropathy. Moderate right foraminal stenosis. 2. At C5-6 there is a broad-based disc bulge. Left uncovertebral degenerative changes and mild left facet arthropathy. Severe left foraminal stenosis. Moderate right foraminal stenosis. 3. At C3-4 there is a small right paracentral disc protrusion.   Electronically Signed   By: Elige Ko   On: 12/02/2017 11:55   MRI LUMBAR SPINE WITHOUT CONTRAST  TECHNIQUE: Multiplanar, multisequence MR imaging of the lumbar spine was performed. No intravenous contrast was administered.  COMPARISON: 06/03/2009  FINDINGS: Segmentation: Standard.  Alignment: Physiologic.  Vertebrae: No fracture, evidence of discitis, or bone lesion.  Conus medullaris: Extends to the T12 level and appears normal.  Paraspinal and other soft tissues: Negative.  Disc levels:  Disc spaces: Degenerative disc disease with disc height loss at L2-3, L3-4 and  L4-5.  T12-L1: No significant disc bulge. No evidence of neural foraminal stenosis. No central canal stenosis.  L1-L2: Mild broad-based disc bulge. Mild bilateral facet arthropathy. No evidence of neural foraminal stenosis. No central canal stenosis.  L2-L3: Broad-based disc bulge. Mild bilateral facet arthropathy. No evidence of neural foraminal stenosis. No central canal stenosis.  L3-L4: Broad-based disc bulge with a central disc protrusion. Mild bilateral facet arthropathy. Prior L4 laminectomy. Mild right foraminal stenosis. No central canal stenosis.  L4-L5: Broad-based disc bulge left paracentral disc protrusion with mass effect on the left intraspinal L5 nerve root. Moderate bilateral facet arthropathy. Prior L5 laminectomy. No evidence of neural foraminal stenosis. No central canal stenosis.  L5-S1: No significant disc bulge. No evidence of neural foraminal stenosis. No central canal stenosis. Mild bilateral facet arthropathy.  IMPRESSION: 1. At L4-5 there is a broad-based disc bulge left paracentral disc protrusion with mass effect on the left intraspinal L5 nerve root. Moderate bilateral facet arthropathy. Prior L5 laminectomy. 2. At L3-4 there is a broad-based disc bulge with a central disc protrusion. Mild bilateral facet arthropathy. Prior L4 laminectomy. Mild right foraminal stenosis.   Electronically Signed By: Elige Ko On: 05/05/2017 08:36  LUMBAR SPINE - COMPLETE 4+ VIEW  COMPARISON: None in PACs  FINDINGS: The twelfth ribs are small. The lumbar vertebral bodies are preserved in height. The disc space heights are reasonably well-maintained. There small anterior endplate osteophytes at L3 and L4. There is no spondylolisthesis. There is mild facet joint hypertrophy at L4-5 and L5-S1. The pedicles and transverse processes are intact. The observed portions of the sacrum are normal.  IMPRESSION: There is no acute or significant chronic  bony abnormality of the lumbar spine. If there are radicular symptoms, lumbar spine MRI would be  a useful next imaging step.   Electronically Signed By: David Swaziland M.D. On: 02/23/2017 08:55   He reports that he has never smoked. He has never used smokeless tobacco. No results for input(s): HGBA1C, LABURIC in the last 8760 hours.  Objective:  VS:  HT:    WT:   BMI:     BP:130/90  HR:75bpm  TEMP:98.3 F (36.8 C)(Oral)  RESP:95 % Physical Exam  Ortho Exam Imaging: No results found.  Past Medical/Family/Surgical/Social History: Medications & Allergies reviewed per EMR, new medications updated. Patient Active Problem List   Diagnosis Date Noted  . Right anterior shoulder pain 11/08/2017  . Neck pain 07/04/2017  . Gout 02/05/2017  . Lumbar spondylosis 01/19/2017  . Acute bilateral low back pain with bilateral sciatica 01/19/2017   Past Medical History:  Diagnosis Date  . Arthritis   . GERD (gastroesophageal reflux disease)    occasional  . Gout   . Hypertension   . Kidney stone    History reviewed. No pertinent family history. Past Surgical History:  Procedure Laterality Date  . BACK SURGERY     1984 and 1996  . JOINT REPLACEMENT     left hip 09/2006  . TOTAL HIP ARTHROPLASTY  09/14/2011   Procedure: TOTAL HIP ARTHROPLASTY;  Surgeon: Loreta Ave, MD;  Location: [facility] OR;  Service: Orthopedics;  Laterality: Right;   Social History   Occupational History  . Not on file  Tobacco Use  . Smoking status: Never Smoker  . Smokeless tobacco: Never Used  Substance and Sexual Activity  . Alcohol use: Yes    Comment: occasionally  . Drug use: Not on file  . Sexual activity: Not on file

## 2018-02-02 ENCOUNTER — Encounter: Payer: Self-pay | Admitting: Sports Medicine

## 2018-02-02 ENCOUNTER — Ambulatory Visit: Payer: BLUE CROSS/BLUE SHIELD | Admitting: Sports Medicine

## 2018-02-02 VITALS — BP 124/86 | HR 70 | Ht 69.0 in | Wt 192.0 lb

## 2018-02-02 DIAGNOSIS — M47816 Spondylosis without myelopathy or radiculopathy, lumbar region: Secondary | ICD-10-CM

## 2018-02-02 DIAGNOSIS — M542 Cervicalgia: Secondary | ICD-10-CM

## 2018-02-02 DIAGNOSIS — M5442 Lumbago with sciatica, left side: Secondary | ICD-10-CM

## 2018-02-02 DIAGNOSIS — M47812 Spondylosis without myelopathy or radiculopathy, cervical region: Secondary | ICD-10-CM | POA: Diagnosis not present

## 2018-02-02 DIAGNOSIS — G8929 Other chronic pain: Secondary | ICD-10-CM

## 2018-02-02 DIAGNOSIS — M5441 Lumbago with sciatica, right side: Secondary | ICD-10-CM

## 2018-02-02 DIAGNOSIS — M25511 Pain in right shoulder: Secondary | ICD-10-CM | POA: Diagnosis not present

## 2018-02-02 MED ORDER — INDOMETHACIN 50 MG PO CAPS: 50.0000 mg | ORAL_CAPSULE | Freq: Three times a day (TID) | ORAL | 1 refills | Status: DC

## 2018-02-02 MED ORDER — METHOCARBAMOL 500 MG PO TABS: 500.0000 mg | ORAL_TABLET | Freq: Three times a day (TID) | ORAL | 1 refills | Status: DC | PRN

## 2018-02-02 NOTE — Progress Notes (Signed)
Veverly FellsMichael D. Delorise Shinerigby, DO  [facility] [place] [phone]  Kyle Lawson - 62 y.o. male MRN [id number]  Date of birth: [birth year]  Visit Date: 02/02/2018  PCP: Kyle RocksMerrell, David J, MD   Referred by: Kyle RocksMerrell, David J, MD  Scribe for today's visit: Kyle Lawson, CMA     SUBJECTIVE:  Kyle Lawson is here for Follow-up (neck and R shoulder pain)  01/19/17: Pt presents today for chronic low back pain and tingling in left foot Tingling in the left foot started about 8 weeks ago and comes and goes.  Pt has chronic back pain, hx of 2 surgeries in 1984 and 1996. 8 weeks ago he was doing yard work and his back started to spasm. He has had trouble with radiculopathy in the past. The pain is described as tingling and numbess and is rated as 4/10-6/10. Worsened with sitting for long periods of time Improves with getting up and moving around Therapies tried include massage therapy, stretching, Ibuprofen, Acetaminophen Other associated symptoms include: Pt denies current pain in left leg and back pain has improved. He did have some warmth in the left calf which subsided about 1-2 weeks ago.  Pt denies fever, chills, night sweats  02/23/17: Kyle Lawson reports improvement in his lower back pain but no so much in his left foot--persistent numbness/tingling. Osteopathic Manipulation was performed during last visit with no relief. He has completed a Medrol dose pack which gave him mild relief. Taking Indomethacin 50 mg prn. He is consistent with the home therapeutic exercises. Pt has been working with a massage therapist for sx as well but no sure if there is any improvment. He does get relief when laying down and icing the foot. Had one episode of severe sx while sleeping--took a left over Vicodin at that point with relief. Sitting for long periods of time, hamstring stretching, hip abduction exercises triggers is sx. Being Kyle Lawson gives him the most relief. He is in the process  of getting a stand up desk at work to see if that helps.   04/19/17: Kyle Lawson is an established patient presenting today in follow-up of bilateral low back pain and bilateral sciatica and lumbar spondylosis.  Pt had epidural steroid injection 03/22/17 and reports that sx have not changed much. He has numbness and pain mostly in the left foot but a little in the right foot as well. He has been doing lumbar stabilization exercises with little trouble. Sx are worse when sitting, even more so for long periods of time. The exercises don't "light me up" like the used to. If he does additional exercises he feels like he gets set back. He says that he is generally uncomfortable most of the time. He had OMT in the past and got no relief. He is taking indomethacin but he has not started the Gabapentin because he has concerns about the side effects. He takes Ibuprofen and Tylenol as needed. He does get some mild relief when he lays down and applied ice to his back. When is first wakes up in the morning he doesn't feel the pain and tingling but within a couple of hours of being awake it starts to appear and seems to get worse throughout the day. He is beginning to get discourage because he is not getting any relief.   07/04/17: [name] is an established pt presenting today for f/u of B low back pain and B LE sciatica.  Pt was last seen in our office  on 05/10/17 and had an epidural injection w/ Dr. Alvester Lawson on 05/25/17.  Pt states that the epidural "had an effect" but didn't solve his issue.  He states that the injection changed how his pain felt and "crystalized" the pain to his L glute and isolated the numbness to the L ant ankle.  He reports having less overall numbness, with no appreciable numbness in his R LE and occasional numbness in the L LE.  He notes that he feels about 40% improved.  He also reports having stopped taking the gabapentin. He states that he has developed a new issue with that being neck and R  shoulder pain x approximately one month.  He notes that he has been getting massages and this has helped slightly.  He notes that he has been having some N/T in his L deltoid associated w/ the neck and R shoulder pain.  He reports that he has switched to a stand-up desk about 6-8 weeks ago and wonders if this might be contributing to his new neck and R shoulder pain.  11/08/17: Compared to the last office visit, his previously described symptoms were improving until recently Current symptoms are mild & are radiating to L left/foot He stopped taking Gabapentin in October 2018. Compared to the last office visit, his previously described neck and shoulder symptoms are worsening, he is now waking up with HA daily.  Current symptoms are moderate-severe & are radiating to R shoulder He has tried dry needling a few times and got the most relief from that.   12/06/2017: Compared to the last office visit, his previously described symptoms show no change. Current symptoms are moderate & are radiating to R shoulder.  He has been taking Gabapentin BID-TID with minimal relief.   02/02/2018: Compared to the last office visit, his previously described symptoms are worsening. Sx seem to worsen after working with a mouse and looking at his monitor that is to the right.  Current symptoms are moderate-severe & are radiating to the R shoulder.  He has been taking Gabapentin 2-3 x daily with some relief. He has been taking IBU (4-6 tablets daily) with some relief. He takes Indomethacin when the pain is severe but it makes him feel dizzy. He has done PT and dry needling in the past.  He had interlaminar injection with Dr. Alvester Lawson 12/21/2017 and hasn't noticed any change in sx.  MR c-spine 12/02/2017  ROS Reports night time disturbances. Denies fevers, chills, or night sweats. Denies unexplained weight loss. Denies personal history of cancer. Denies changes in bowel or bladder habits. Denies recent unreported  falls. Denies new or worsening dyspnea or wheezing. Reports headaches (right-sided).  Reports numbness, tingling or weakness in R arm stopping at elbow.  Denies dizziness or presyncopal episodes Denies lower extremity edema    HISTORY & PERTINENT PRIOR DATA:  Prior History reviewed and updated per electronic medical record.  Significant/pertinent history, findings, studies include:  reports that he has never smoked. He has never used smokeless tobacco. No results for input(s): HGBA1C, LABURIC, CREATINE in the last 8760 hours. No specialty comments available. No problems updated.  OBJECTIVE:  VS:  HT:5\' 9"  (175.3 cm)   WT:192 lb (87.1 kg)  BMI:28.34    BP:124/86  HR:70bpm  TEMP: ( )  RESP:96 %   PHYSICAL EXAM: Constitutional: WDWN, Non-toxic appearing. Psychiatric: Alert & appropriately interactive.  Not depressed or anxious appearing. Respiratory: No increased work of breathing.  Trachea Midline Eyes: Pupils are equal.  EOM intact without  nystagmus.  No scleral icterus  Vascular Exam: warm to touch no edema  upper extremity neuro exam: unremarkable Patient with brachial plexus with right, normal on the left.  Positive Spurling compression test on the right ipsilateral.  Some pain with cervical sidebending to the left without any radicular component.  Negative Hoffmann's.  Upper extremity reflexes are diminished diffusely bilaterally but are symmetric.  He has mild mild pain with straight leg raise on the left but he is able to heel toe walk without significant difficulty.     ASSESSMENT & PLAN:   1. Neck pain, chronic   2. Neck pain   3. Right anterior shoulder pain   4. Spondylosis of cervical region without myelopathy or radiculopathy   5. Lumbar spondylosis   6. Acute bilateral low back pain with bilateral sciatica     PLAN: Overall given the duration of the symptoms and the ongoing problems with a right shoulder and neck pain including headaches neurosurgical  evaluation indicated at this time.  He is previously seen Dr. Wynetta Emery for his low back as well and may need further intervention for his lumbar spine given the persistent radicular nature of this and the fact that his day-to-day life.  Shoulder and neck pain I believe is reflective of a C5 radiculitis and will likely require a multilevel fusion discussed with further evaluation and discussion with Dr. Wynetta Emery will be indicated and I would obviously defer to his expertise.  Trial of muscle relaxer to see if this is beneficial in the interim and he will plan to follow-up with physical therapy for continued dry needling but understands that this is only likely temporizing.  Follow-up: Return if symptoms worsen or fail to improve.      Please see additional documentation for Objective, Assessment and Plan sections. Pertinent additional documentation may be included in corresponding procedure notes, imaging studies, problem based documentation and patient instructions. Please see these sections of the encounter for additional information regarding this visit.  CMA/ATC served as Neurosurgeon during this visit. History, Physical, and Plan performed by medical provider. Documentation and orders reviewed and attested to.      Andrena Mews, DO    [facility]

## 2018-02-07 DIAGNOSIS — M50122 Cervical disc disorder at C5-C6 level with radiculopathy: Secondary | ICD-10-CM | POA: Diagnosis not present

## 2018-02-07 DIAGNOSIS — M542 Cervicalgia: Secondary | ICD-10-CM | POA: Diagnosis not present

## 2018-02-14 DIAGNOSIS — M542 Cervicalgia: Secondary | ICD-10-CM | POA: Diagnosis not present

## 2018-02-14 DIAGNOSIS — M50122 Cervical disc disorder at C5-C6 level with radiculopathy: Secondary | ICD-10-CM | POA: Diagnosis not present

## 2018-02-15 DIAGNOSIS — M4722 Other spondylosis with radiculopathy, cervical region: Secondary | ICD-10-CM | POA: Diagnosis not present

## 2018-03-26 DIAGNOSIS — M4722 Other spondylosis with radiculopathy, cervical region: Secondary | ICD-10-CM | POA: Diagnosis not present

## 2018-03-26 DIAGNOSIS — M50121 Cervical disc disorder at C4-C5 level with radiculopathy: Secondary | ICD-10-CM | POA: Diagnosis not present

## 2018-03-26 DIAGNOSIS — M4312 Spondylolisthesis, cervical region: Secondary | ICD-10-CM | POA: Diagnosis not present

## 2018-04-13 ENCOUNTER — Other Ambulatory Visit: Payer: Self-pay | Admitting: Sports Medicine

## 2018-05-08 DIAGNOSIS — M542 Cervicalgia: Secondary | ICD-10-CM | POA: Diagnosis not present

## 2018-05-25 DIAGNOSIS — M50122 Cervical disc disorder at C5-C6 level with radiculopathy: Secondary | ICD-10-CM | POA: Diagnosis not present

## 2018-05-25 DIAGNOSIS — M542 Cervicalgia: Secondary | ICD-10-CM | POA: Diagnosis not present

## 2018-05-30 DIAGNOSIS — N3001 Acute cystitis with hematuria: Secondary | ICD-10-CM | POA: Diagnosis not present

## 2018-05-30 DIAGNOSIS — R300 Dysuria: Secondary | ICD-10-CM | POA: Diagnosis not present

## 2018-06-15 DIAGNOSIS — M542 Cervicalgia: Secondary | ICD-10-CM | POA: Diagnosis not present

## 2018-06-15 DIAGNOSIS — M50122 Cervical disc disorder at C5-C6 level with radiculopathy: Secondary | ICD-10-CM | POA: Diagnosis not present

## 2018-06-22 DIAGNOSIS — M50122 Cervical disc disorder at C5-C6 level with radiculopathy: Secondary | ICD-10-CM | POA: Diagnosis not present

## 2018-06-22 DIAGNOSIS — M542 Cervicalgia: Secondary | ICD-10-CM | POA: Diagnosis not present

## 2018-06-28 DIAGNOSIS — M542 Cervicalgia: Secondary | ICD-10-CM | POA: Diagnosis not present

## 2018-07-06 DIAGNOSIS — M50122 Cervical disc disorder at C5-C6 level with radiculopathy: Secondary | ICD-10-CM | POA: Diagnosis not present

## 2018-07-06 DIAGNOSIS — M542 Cervicalgia: Secondary | ICD-10-CM | POA: Diagnosis not present

## 2018-07-20 DIAGNOSIS — M50122 Cervical disc disorder at C5-C6 level with radiculopathy: Secondary | ICD-10-CM | POA: Diagnosis not present

## 2018-07-20 DIAGNOSIS — M542 Cervicalgia: Secondary | ICD-10-CM | POA: Diagnosis not present

## 2018-07-26 DIAGNOSIS — M50122 Cervical disc disorder at C5-C6 level with radiculopathy: Secondary | ICD-10-CM | POA: Diagnosis not present

## 2018-07-26 DIAGNOSIS — M542 Cervicalgia: Secondary | ICD-10-CM | POA: Diagnosis not present

## 2018-07-30 DIAGNOSIS — D225 Melanocytic nevi of trunk: Secondary | ICD-10-CM | POA: Diagnosis not present

## 2018-07-30 DIAGNOSIS — L570 Actinic keratosis: Secondary | ICD-10-CM | POA: Diagnosis not present

## 2018-07-30 DIAGNOSIS — Z808 Family history of malignant neoplasm of other organs or systems: Secondary | ICD-10-CM | POA: Diagnosis not present

## 2018-07-30 DIAGNOSIS — L821 Other seborrheic keratosis: Secondary | ICD-10-CM | POA: Diagnosis not present

## 2018-07-30 DIAGNOSIS — D2272 Melanocytic nevi of left lower limb, including hip: Secondary | ICD-10-CM | POA: Diagnosis not present

## 2018-09-03 DIAGNOSIS — M50122 Cervical disc disorder at C5-C6 level with radiculopathy: Secondary | ICD-10-CM | POA: Diagnosis not present

## 2018-09-03 DIAGNOSIS — M542 Cervicalgia: Secondary | ICD-10-CM | POA: Diagnosis not present

## 2018-09-05 HISTORY — PX: DISTAL CLAVICLE EXCISION: SHX1463

## 2018-09-11 DIAGNOSIS — M542 Cervicalgia: Secondary | ICD-10-CM | POA: Diagnosis not present

## 2018-09-26 ENCOUNTER — Encounter: Payer: Self-pay | Admitting: Sports Medicine

## 2018-09-26 MED ORDER — GABAPENTIN 300 MG PO CAPS: ORAL_CAPSULE | ORAL | 2 refills | Status: DC

## 2018-09-26 NOTE — Telephone Encounter (Signed)
Spoke with pt, he had surgery on March 26 2018. He has what he feels is residual nerve pain. Initially and overall he has notice great improvement. More recently, he reports "set back" as he is starting to notice radiating pain into the R shoulder again. Dr Berline Chough had discussed using Gabapentin for radiating neck pain in the past. He has about 3 Gabapentin 300 mg left and was wondering if Dr. Berline Chough would refill this for him to see if it will help with his radiating neck pain.   Spoke with Dr. Berline Chough, got verbal to refill rx.

## 2018-10-16 ENCOUNTER — Encounter: Payer: Self-pay | Admitting: Sports Medicine

## 2018-10-16 ENCOUNTER — Ambulatory Visit: Payer: BLUE CROSS/BLUE SHIELD | Admitting: Sports Medicine

## 2018-10-16 ENCOUNTER — Telehealth: Payer: Self-pay

## 2018-10-16 ENCOUNTER — Ambulatory Visit: Payer: Self-pay

## 2018-10-16 ENCOUNTER — Ambulatory Visit (INDEPENDENT_AMBULATORY_CARE_PROVIDER_SITE_OTHER): Payer: BLUE CROSS/BLUE SHIELD

## 2018-10-16 VITALS — BP 122/86 | HR 95 | Ht 69.0 in | Wt 186.8 lb

## 2018-10-16 DIAGNOSIS — M9902 Segmental and somatic dysfunction of thoracic region: Secondary | ICD-10-CM

## 2018-10-16 DIAGNOSIS — M9908 Segmental and somatic dysfunction of rib cage: Secondary | ICD-10-CM

## 2018-10-16 DIAGNOSIS — G8929 Other chronic pain: Secondary | ICD-10-CM

## 2018-10-16 DIAGNOSIS — M9901 Segmental and somatic dysfunction of cervical region: Secondary | ICD-10-CM | POA: Diagnosis not present

## 2018-10-16 DIAGNOSIS — M25511 Pain in right shoulder: Secondary | ICD-10-CM

## 2018-10-16 DIAGNOSIS — S4991XA Unspecified injury of right shoulder and upper arm, initial encounter: Secondary | ICD-10-CM | POA: Diagnosis not present

## 2018-10-16 DIAGNOSIS — M542 Cervicalgia: Secondary | ICD-10-CM

## 2018-10-16 DIAGNOSIS — M19019 Primary osteoarthritis, unspecified shoulder: Secondary | ICD-10-CM | POA: Diagnosis not present

## 2018-10-16 MED ORDER — DICLOFENAC SODIUM 2 % TD SOLN: 1.0000 "application " | Freq: Two times a day (BID) | TRANSDERMAL | 0 refills | Status: AC

## 2018-10-16 MED ORDER — DICLOFENAC SODIUM 2 % TD SOLN: 1.0000 "application " | Freq: Two times a day (BID) | TRANSDERMAL | 2 refills | Status: DC

## 2018-10-16 NOTE — Patient Instructions (Addendum)
Pennsaid instructions: You have been given a sample/prescription for Pennsaid, a topical medication.     You are to apply this gel to your injured body part twice daily (morning and evening).   A little goes a long way so you can use about a pea-sized amount for each area.   Spread this small amount over the area into a thin film and let it dry.   Be sure that you do not rub the gel into your skin for more than 10 or 15 seconds otherwise it can irritate you skin.    Once you apply the gel, please do not put any other lotion or clothing in contact with that area for 30 minutes to allow the gel to absorb into your skin.   Some people are sensitive to the medication and can develop a sunburn-like rash.  If you have only mild symptoms it is okay to continue to use the medication but if you have any breakdown of your skin you should discontinue its use and please let us know.   If you have been written a prescription for Pennsaid, you will receive a pump bottle of this topical gel through a mail order pharmacy.  The instructions on the bottle will say to apply two pumps twice a day which may be too much gel for your particular area so use the pea-sized amount as your guide.  Instructions for Duexis, Pennsaid and Vimovo:  Your prescription will be filled through a participating [facility] mail order pharmacy.  You will receive a phone call or text from one of the participating pharmacies which can be located in any state in the [place].  You must communicate directly with them to have this medication filled.  When the pharmacy contacts you, they will need your mailing address (for shipment of the medication) andy they will need payment information if you have a copay (typically no more than $10). If you have not heard from them 2-3 days after your appointment with Dr. Rigby, contact [facility] directly at [phone].  

## 2018-10-16 NOTE — Telephone Encounter (Signed)
PA initiated via [email or web address]  Spoke with pt, he has tried IBU, Meloxicam, and diclofenac 1% in the past.

## 2018-10-16 NOTE — Progress Notes (Signed)
Kyle FellsMichael D. Delorise Shinerigby, DO  [facility] [place] [phone]  Kyle DunningJohn Lawson - 63 y.o. male MRN [id number]  Date of birth: April 10, 1956  Visit Date: October 16, 2018  PCP: Ozella RocksMerrell, David J, MD   Referred by: Ozella RocksMerrell, David J, MD  SUBJECTIVE:  Chief Complaint  Patient presents with  . Follow-up    R shoulder.  Recent fall on 09/28/18.  Increased mechanical symptoms.    HPI: Patient presents for reevaluation of right shoulder pain which she was actually initially seen for last year.  Kyle Lawson is 6/6 months status post 2 level ACDF of C4-5 with Dr. Wynetta Emeryram.  Kyle Lawson was doing well and not having any significant upper extremity symptoms up until approximately 2 months ago.  Fortunately since that time Kyle Lawson is actually had a fall where Kyle Lawson landed directly on the edge of bed due to tripping over close at night.  Kyle Lawson is having worsening clicking and popping over the shoulder as well as worsening pain radiating into the right arm and elbow.  Kyle Lawson has been undergoing massage therapy for many decades of Derrill MemoSarah Clawson and has had good improvement this but Kyle Lawson continues to have significant paraspinal muscle spasms.  Kyle Lawson denies any weakness numbness or tingling but once again does have a dysesthesia into the right upper extremity.  REVIEW OF SYSTEMS: No significant nighttime awakenings due to this issue. Denies fevers, chills, recent weight gain or weight loss.  No night sweats.  Pt denies any change in bowel or bladder habits, muscle weakness, numbness or falls associated with this pain.  HISTORY:  Prior history reviewed and updated per electronic medical record.  Patient Active Problem List   Diagnosis Date Noted  . Right anterior shoulder pain 11/08/2017  . Neck pain 07/04/2017  . Gout 02/05/2017  . Lumbar spondylosis 01/19/2017  . Acute bilateral low back pain with bilateral sciatica 01/19/2017   Social History   Occupational History  . Not on file  Tobacco Use  . Smoking  status: Never Smoker  . Smokeless tobacco: Never Used  Substance and Sexual Activity  . Alcohol use: Yes    Comment: occasionally  . Drug use: Not on file  . Sexual activity: Not on file   Social History   Social History Narrative  . Not on file     OBJECTIVE:  VS:  HT:5\' 9"  (175.3 cm)   WT:186 lb 12.8 oz (84.7 kg)  BMI:27.57    BP:122/86  HR:95bpm  TEMP: ( )  RESP:96 %   PHYSICAL EXAM: Adult male. No acute distress.  Alert and appropriate. His neck is well aligned without significant deformity.  Well-healed postsurgical incision.  Kyle Lawson has good cervical rotation with good generalized flexion and extension.  Kyle Lawson has marked paraspinal muscle spasms bilaterally worse on the right than the left.  His posterior scalene as well as his omohyoid are both in spasm.  Kyle Lawson has a negative Spurling's compression test and Lhermitte's compression test.  His upper extremity strength and reflexes are normal.  His right shoulder is well aligned Kyle Lawson does have a slight palpable deformity over the [facility] joint.  Kyle Lawson has some pain with axial load and circumduction but this is minimal.  No significant crepitation.  Kyle Lawson does have intact rotator cuff strength with really good overhead range of motion and negative Hawkins, Neer's speeds and O'Brien's testing.   ASSESSMENT:  1. Acute pain of right shoulder   2. Neck pain, chronic   3. Neck pain  4. Right anterior shoulder pain   5. AC joint arthropathy   6. Somatic dysfunction of cervical region   7. Somatic dysfunction of thoracic region   8. Somatic dysfunction of rib cage region     PROCEDURES:  PROCEDURE NOTE: OSTEOPATHIC MANIPULATION   The decision today to treat with Osteopathic Manipulative Therapy (OMT) was based on physical exam findings. Verbal consent was obtained following a discussion with the patient regarding the of risks, benefits and potential side effects, including an acute pain flare,post manipulation soreness and need for repeat treatments.  Cervical manipulation was discussed and given the ongoing symptoms soft tissue mobilization only will be performed.  Avoid HVLA.NONE and s/p 2 level ACDF  Manipulation was performed as below:  [facility] Treated & Osteopathic Exam Findings   CERVICAL SPINE:  OA - rotated right C6 Flexed, rotated RIGHT, sidebent RIGHT THORACIC SPINE:   T4 - 6 Neutral, rotated RIGHT, sidebent LEFT UPPER EXTREMITIES:  Right posterior scalene trigger point Right supraspinatous tenderpoint Right trapezius tenderpoint    OMT Techniques Used:  muscle energy myofascial release HVLA - (Not in cervical spine region)    The patient tolerated the treatment well and reported Improved symptoms following treatment today. Patient was given medications, exercises, stretches and lifestyle modifications per AVS and verbally.       PLAN:  Pertinent additional documentation may be included in corresponding procedure notes, imaging studies, problem based documentation and patient instructions.  No problem-specific Assessment & Plan notes found for this encounter.   Shoulder seems to be functional in nature with some underlying AC joint arthropathy that should respond to Pennsaid.  Kyle Lawson did irritate a prior injury in the most recent fall.  This is only minimally symptomatic for him at this time Kyle Lawson is not interested in injection today.  Links to [employer] provided today per Patient Instructions.  These exercises were developed by Myles Lipps, DC with a strong emphasis on core neuromuscular reducation and postural realignment through body-weight exercises.  Continue previously prescribed home exercise program.   Discussed the underlying features of tight hip flexors leading to crouched, fetal like position that results in spinal column compression.  Including lumbar hyperflexion with hypermobility, thoracic flexion with restrictive rotation and cervical lordosis reversal.   Osteopathic manipulation was  performed today based on physical exam findings.  Patient has responded well to osteopathic manipulation previously the prior manipulation did not provide permanent long lasting relief.  The patient does feel as though there was significant benefit to the prior manipulation and they wished for repeat manipulation today.  They understand that home therapeutic exercises are critical part of the healing/treatment process and will continue with self treatment between now and their next visit as outlined.  The patient understands that the frequency of visits is meant to provide a stimulus to promote the body's own ability to heal and is not meant to be the sole means for improvement in their symptoms.  Activity modifications and the importance of avoiding exacerbating activities (limiting pain to no more than a 4 / 10 during or following activity) recommended and discussed.  Discussed red flag symptoms that warrant earlier emergent evaluation and patient voices understanding.   Meds ordered this encounter  Medications  . Diclofenac Sodium (PENNSAID) 2 % SOLN    Sig: Place 1 application onto the skin 2 (two) times daily.    Dispense:  112 g    Refill:  2    Home Phone      [phone]  Work Phone      [phone] Mobile          [phone]   . Diclofenac Sodium (PENNSAID) 2 % SOLN    Sig: Place 1 application onto the skin 2 (two) times daily for 1 day.    Dispense:  8 g    Refill:  0   Lab Orders  No laboratory test(s) ordered today    Imaging Orders     DG Shoulder Right Referral Orders  No referral(s) requested today    At follow up will plan to consider: repeat osteopathic manipulation    Return in about 3 weeks (around 11/06/2018) for consideration of repeat Osteopathic Manipulation.          Andrena Mews, DO    [facility]

## 2018-10-16 NOTE — Telephone Encounter (Signed)
Kyle DunningJOHN Lawson (Key: [id number])Your information has been submitted to [employer]. To check for an updated outcome later, reopen this PA request from your dashboard. If you think there may be a problem with your PA request, use our live chat feature at the bottom right.

## 2018-10-17 NOTE — Telephone Encounter (Signed)
Kyle Lawson (Key: [id number])   This request has received a Favorable outcome. Please note any additional information provided by [employer] at the bottom of this request.

## 2018-10-23 DIAGNOSIS — M542 Cervicalgia: Secondary | ICD-10-CM | POA: Diagnosis not present

## 2018-10-23 DIAGNOSIS — R030 Elevated blood-pressure reading, without diagnosis of hypertension: Secondary | ICD-10-CM | POA: Diagnosis not present

## 2018-10-23 DIAGNOSIS — Z6827 Body mass index (BMI) 27.0-27.9, adult: Secondary | ICD-10-CM | POA: Diagnosis not present

## 2018-10-26 DIAGNOSIS — M50122 Cervical disc disorder at C5-C6 level with radiculopathy: Secondary | ICD-10-CM | POA: Diagnosis not present

## 2018-10-26 DIAGNOSIS — M542 Cervicalgia: Secondary | ICD-10-CM | POA: Diagnosis not present

## 2018-10-30 ENCOUNTER — Ambulatory Visit: Payer: BLUE CROSS/BLUE SHIELD | Admitting: Sports Medicine

## 2018-10-30 ENCOUNTER — Encounter: Payer: Self-pay | Admitting: Sports Medicine

## 2018-10-30 VITALS — BP 112/72 | HR 78 | Ht 69.0 in | Wt 187.4 lb

## 2018-10-30 DIAGNOSIS — M542 Cervicalgia: Secondary | ICD-10-CM

## 2018-10-30 DIAGNOSIS — M9901 Segmental and somatic dysfunction of cervical region: Secondary | ICD-10-CM

## 2018-10-30 DIAGNOSIS — M9902 Segmental and somatic dysfunction of thoracic region: Secondary | ICD-10-CM | POA: Diagnosis not present

## 2018-10-30 DIAGNOSIS — G2589 Other specified extrapyramidal and movement disorders: Secondary | ICD-10-CM

## 2018-10-30 DIAGNOSIS — M9908 Segmental and somatic dysfunction of rib cage: Secondary | ICD-10-CM

## 2018-10-30 NOTE — Progress Notes (Signed)
Kyle Lawson. [facility] [phone]  Brandley Raines - 64 y.o. male MRN [id number]  Date of birth: 1955-09-15  Visit Date: October 30, 2018  PCP: [name], MD   Referred by: [name], MD  SUBJECTIVE:  Chief Complaint  Patient presents with  . Neck - Follow-up    HPI: Patient is here for follow-up of his neck and shoulder pain.  He reports overall doing significantly better and is undergoing dry needling massage therapy and responded previously weekly well to the osteopathic manipulation.  He has not having any significant radicular pain but continues to have just generalized discomfort and pain especially at night.  He has seen Dr. Wynetta Emery who is happy with his surgical hardware.  Pain does localize into the [facility] as well as AC joint.  He has been getting some nighttime disturbances.  This is mild to moderate.  He is getting headaches intermittently.  Seems to be worse with work.  REVIEW OF SYSTEMS: Per HPI  HISTORY:  Prior history reviewed and updated per electronic medical record.  Patient Active Problem List   Diagnosis Date Noted  . Right anterior shoulder pain 11/08/2017  . Neck pain 07/04/2017    12/02/2017 MRI C-spine IMPRESSION: 1. At C4-5 there is an eccentric right broad-based disc bulge. Moderate right facet arthropathy. Moderate right foraminal stenosis. 2. At C5-6 there is a broad-based disc bulge. Left uncovertebral degenerative changes and mild left facet arthropathy. Severe left foraminal stenosis. Moderate right foraminal stenosis. 3. At C3-4 there is a small right paracentral disc protrusion.  12/21/2017 Epidural injection with Dr. Alvester Morin.    . Gout 02/05/2017  . Lumbar spondylosis 01/19/2017  . Acute bilateral low back pain with bilateral sciatica 01/19/2017   Social History   Occupational History  . Not on file  Tobacco Use  . Smoking status: Never Smoker  . Smokeless tobacco:  Never Used  Substance and Sexual Activity  . Alcohol use: Yes    Comment: occasionally  . Drug use: Not on file  . Sexual activity: Not on file   Social History   Social History Narrative  . Not on file    OBJECTIVE:  VS:  HT:5\' 9"  (175.3 cm)   WT:187 lb 6.4 oz (85 kg)  BMI:27.66    BP:112/72  HR:78bpm  TEMP: ( )  RESP:96 %   PHYSICAL EXAM: Adult male. No acute distress.  Alert and appropriate. Right shoulder is overall well aligned.  He has a well-healed postsurgical incision across the anterior neck.  He has full overhead range of motion of the shoulders.  Intrinsic rotator cuff strength is intact.  No significant pain with palpation of the AC joint or [place] joint.  There is a small amount of crepitation with overhead movement of the right shoulder over the before meals and  joints with this is mild and nonpainful.  He has a protracted shoulder with scapular dyskinesis.  Negative Spurling's compression test and Lhermitte's compression test.  ASSESSMENT:  1. Somatic dysfunction of cervical region   2. Neck pain   3. Somatic dysfunction of thoracic region   4. Somatic dysfunction of rib cage region   5. Scapular dyskinesis     PROCEDURES:  PROCEDURE NOTE: OSTEOPATHIC MANIPULATION  The decision today to treat with Osteopathic Manipulative Therapy (OMT) was based on physical exam findings. Verbal consent was obtained following a discussion with the patient regarding the of risks, benefits and potential  side effects, including an acute pain flare,post manipulation soreness and need for repeat treatments.   Contraindications to OMT: NONE and Recent cervical spine surgery Manipulation was performed as below: [facility] Treated & Osteopathic Exam Findings CERVICAL SPINE: OA - rotated right C6 ERS RIGHT (Extended, Rotated & Sidebent) THORACIC SPINE:  T3 - 6 Neutral, rotated RIGHT, sidebent LEFT RIBS:  Rib 1 Right  Exhalation dysfunction (elevated/inhaled)  Right upper  extremity: Prominent [place] joint on the right Internally rotated glenohumeral joint  OMT Techniques Used: muscle energy myofascial release articulatory soft tissue Kinesiotaping performed to the right periscapular region from the supraclavicular fossa to the tip of the right scapula.  The patient tolerated the treatment well and reported Improved symptoms following treatment today. Patient was given medications, exercises, stretches and lifestyle modifications per AVS and verbally.      PLAN:  Pertinent additional documentation may be included in corresponding procedure notes, imaging studies, problem based documentation and patient instructions.  No problem-specific Assessment & Plan notes found for this encounter.   He should respond well to the osteopathic manipulation as well as kinesiotaping.  Instructions for this were provided.  If any lack of improvement with the shoulder could consider further diagnostic evaluation but given the ongoing force coupling imbalances he should respond well with continued conservative measures.  Continue previously prescribed home exercise program.   Osteopathic manipulation was performed today based on physical exam findings.  Patient has responded well to osteopathic manipulation previously the prior manipulation did not provide permanent long lasting relief.  The patient does feel as though there was significant benefit to the prior manipulation and they wished for repeat manipulation today.  They understand that home therapeutic exercises are critical part of the healing/treatment process and will continue with self treatment between now and their next visit as outlined.  The patient understands that the frequency of visits is meant to provide a stimulus to promote the body's own ability to heal and is not meant to be the sole means for improvement in their symptoms.  Activity modifications and the importance of avoiding exacerbating activities  (limiting pain to no more than a 4 / 10 during or following activity) recommended and discussed.  Discussed red flag symptoms that warrant earlier emergent evaluation and patient voices understanding.   No orders of the defined types were placed in this encounter.  Lab Orders  No laboratory test(s) ordered today   Imaging Orders  No imaging studies ordered today   Referral Orders  No referral(s) requested today    At follow up will plan to consider: repeat osteopathic manipulation  Return in about 2 weeks (around 11/13/2018).          Andrena Mews, DO    [facility]

## 2018-11-16 DIAGNOSIS — M50122 Cervical disc disorder at C5-C6 level with radiculopathy: Secondary | ICD-10-CM | POA: Diagnosis not present

## 2018-11-16 DIAGNOSIS — M542 Cervicalgia: Secondary | ICD-10-CM | POA: Diagnosis not present

## 2018-11-22 DIAGNOSIS — M542 Cervicalgia: Secondary | ICD-10-CM | POA: Diagnosis not present

## 2018-11-22 DIAGNOSIS — M50122 Cervical disc disorder at C5-C6 level with radiculopathy: Secondary | ICD-10-CM | POA: Diagnosis not present

## 2018-11-27 ENCOUNTER — Encounter: Payer: Self-pay | Admitting: Sports Medicine

## 2018-11-27 ENCOUNTER — Other Ambulatory Visit: Payer: Self-pay

## 2018-11-27 ENCOUNTER — Ambulatory Visit: Payer: Self-pay

## 2018-11-27 ENCOUNTER — Ambulatory Visit: Payer: BLUE CROSS/BLUE SHIELD | Admitting: Sports Medicine

## 2018-11-27 VITALS — BP 124/82 | HR 77 | Temp 97.5°F | Ht 69.0 in | Wt 191.0 lb

## 2018-11-27 DIAGNOSIS — M19019 Primary osteoarthritis, unspecified shoulder: Secondary | ICD-10-CM | POA: Diagnosis not present

## 2018-11-27 DIAGNOSIS — M50122 Cervical disc disorder at C5-C6 level with radiculopathy: Secondary | ICD-10-CM | POA: Diagnosis not present

## 2018-11-27 DIAGNOSIS — M542 Cervicalgia: Secondary | ICD-10-CM | POA: Diagnosis not present

## 2018-11-27 DIAGNOSIS — M25511 Pain in right shoulder: Secondary | ICD-10-CM

## 2018-11-27 NOTE — Progress Notes (Signed)
Kyle Lawson. Kyle Lawson [facility] [phone]  Kyle Lawson - 63 y.o. male MRN [id number]  Date of birth: 10-04-1955  Visit Date: November 27, 2018  PCP: Kyle Rocks, MD   Referred by: Kyle Rocks, MD  SUBJECTIVE:  Chief Complaint  Patient presents with  . Neck - Follow-up    XR C-spine 12/02/2017.   . Right Shoulder - Follow-up    XR 10/16/2018. Has tried Pen[place]aid, dry-needling, massage.     HPI: Patient is here for continued right shoulder pain.  Focally having AC joint pain especially with overhead lifting and with external rotation activities.  He does not feel that this is completely healed from over 2 months ago when he had the injury previously discussed.  This does continue to hurt while laying on his side.  Medications have only been minimally effective.  He is continue with his home therapeutic exercises as well as working with physical therapy for dry needling through [facility].  REVIEW OF SYSTEMS: No significant nighttime awakenings due to this issue. Denies fevers, chills, recent weight gain or weight loss.  No night sweats.  Pt denies any change in bowel or bladder habits, muscle weakness, numbness or falls associated with this pain.  HISTORY:  Prior history reviewed and updated per electronic medical record.  Patient Active Problem List   Diagnosis Date Noted  . Right anterior shoulder pain 11/08/2017  . Neck pain 07/04/2017    12/02/2017 MRI C-spine IMPRESSION: 1. At C4-5 there is an eccentric right broad-based disc bulge. Moderate right facet arthropathy. Moderate right foraminal stenosis. 2. At C5-6 there is a broad-based disc bulge. Left uncovertebral degenerative changes and mild left facet arthropathy. Severe left foraminal stenosis. Moderate right foraminal stenosis. 3. At C3-4 there is a small right paracentral disc protrusion.  12/21/2017 Epidural injection with Dr. Alvester Morin.    . Gout  02/05/2017  . Lumbar spondylosis 01/19/2017  . Acute bilateral low back pain with bilateral sciatica 01/19/2017   Social History   Occupational History  . Not on file  Tobacco Use  . Smoking status: Never Smoker  . Smokeless tobacco: Never Used  Substance and Sexual Activity  . Alcohol use: Yes    Comment: occasionally  . Drug use: Not on file  . Sexual activity: Not on file   Social History   Social History Narrative  . Not on file    OBJECTIVE:  VS:  HT:5\' 9"  (175.3 cm)   WT:191 lb (86.6 kg)  BMI:28.19    BP:124/82  HR:77bpm  TEMP:(!) 97.5 F (36.4 C)(Oral)  RESP:97 %   PHYSICAL EXAM: Adult male. No acute distress.  Alert and appropriate. Right shoulder overall well aligned without significant deformity.  He has full overhead range of motion.  There is a small amount of crepitation with overhead reaching of the right shoulder.  There is a small amount of ballottement directly over the [facility] joint.  He has good internal and external rotation strength.  Focally tender over the [facility] joint.  Cervical range of motion is limited but well-maintained given the prior interventions.   ASSESSMENT:  1. AC joint arthropathy   2. Acute pain of right shoulder   3. Neck pain     PROCEDURES:  US Guided Injection per procedure note      PLAN:  Pertinent additional documentation may be included in corresponding procedure notes, imaging studies, problem based documentation and patient instructions.  No problem-specific  Assessment & Plan notes found for this encounter.   He should do well with the injection today.  He did have a slight AC joint injury over several months ago and this does not seem to be healing as expected.  AC joint arthropathy is contributing this and should benefit from intra-articular injection today.  Home Therapeutic Exercises: Continue previously prescribed home exercise program   Activity modifications and the importance of avoiding exacerbating activities  (limiting pain to no more than a 4 / 10 during or following activity) recommended and discussed.   Discussed red flag symptoms that warrant earlier emergent evaluation and patient voices understanding.    No orders of the defined types were placed in this encounter.  Lab Orders  No laboratory test(s) ordered today    Imaging Orders     [name] [facility] POCT ULTRASOUND Referral Orders  No referral(s) requested today    Return if symptoms worsen or fail to improve, for R soulder and neck.          Andrena Mews, DO    [facility]

## 2018-11-27 NOTE — Procedures (Signed)
PROCEDURE NOTE:  Ultrasound Guided: Injection: Right shoulder, AC joint Images were obtained and interpreted by myself, Gaspar Bidding, DO  Images have been saved and stored to PACS system. Images obtained on: GE S7 Ultrasound machine    ULTRASOUND FINDINGS:  Moderate changes with calcific change within the intra-articular portion of the [facility] joint.  DESCRIPTION OF PROCEDURE:  The patient's clinical condition is marked by substantial pain and/or significant functional disability. Other conservative therapy has not provided relief, is contraindicated, or not appropriate. There is a reasonable likelihood that injection will significantly improve the patient's pain and/or functional impairment.   After discussing the risks, benefits and expected outcomes of the injection and all questions were reviewed and answered, the patient wished to undergo the above named procedure.  Verbal consent was obtained.  The ultrasound was used to identify the target structure and adjacent neurovascular structures. The skin was then prepped in sterile fashion and the target structure was injected under direct visualization using sterile technique as below:  Single injection performed as below: PREP: Alcohol and Ethel Chloride APPROACH:direct, single injection, 25g 1.5 in. INJECTATE: 1 cc 0.5% Marcaine and 1 cc 40mg /mL DepoMedrol ASPIRATE: None DRESSING: Band-Aid  Post procedural instructions including recommending icing and warning signs for infection were reviewed.    This procedure was well tolerated and there were no complications.   IMPRESSION: Succesful Ultrasound Guided: Injection

## 2018-11-27 NOTE — Patient Instructions (Addendum)

## 2018-12-13 ENCOUNTER — Other Ambulatory Visit: Payer: Self-pay

## 2018-12-13 MED ORDER — DICLOFENAC SODIUM 2 % TD SOLN: 1.0000 "application " | Freq: Two times a day (BID) | TRANSDERMAL | 2 refills | Status: DC

## 2018-12-13 NOTE — Telephone Encounter (Signed)
Last OV 11/27/2018 Last refill 10/16/18 #112g/2 Next OV not scheduled

## 2018-12-18 DIAGNOSIS — Z23 Encounter for immunization: Secondary | ICD-10-CM | POA: Diagnosis not present

## 2018-12-18 DIAGNOSIS — L089 Local infection of the skin and subcutaneous tissue, unspecified: Secondary | ICD-10-CM | POA: Diagnosis not present

## 2018-12-18 DIAGNOSIS — T148XXA Other injury of unspecified body region, initial encounter: Secondary | ICD-10-CM | POA: Diagnosis not present

## 2018-12-18 DIAGNOSIS — S95012A Laceration of dorsal artery of left foot, initial encounter: Secondary | ICD-10-CM | POA: Diagnosis not present

## 2019-01-16 ENCOUNTER — Ambulatory Visit: Payer: BLUE CROSS/BLUE SHIELD | Admitting: Family Medicine

## 2019-01-16 ENCOUNTER — Other Ambulatory Visit: Payer: Self-pay

## 2019-01-16 ENCOUNTER — Encounter: Payer: Self-pay | Admitting: Family Medicine

## 2019-01-16 ENCOUNTER — Ambulatory Visit: Payer: Self-pay

## 2019-01-16 VITALS — BP 150/90 | HR 79 | Ht 69.0 in | Wt 187.0 lb

## 2019-01-16 DIAGNOSIS — M25511 Pain in right shoulder: Secondary | ICD-10-CM

## 2019-01-16 DIAGNOSIS — R0789 Other chest pain: Secondary | ICD-10-CM

## 2019-01-16 DIAGNOSIS — G8929 Other chronic pain: Secondary | ICD-10-CM | POA: Diagnosis not present

## 2019-01-16 MED ORDER — DOXYCYCLINE HYCLATE 100 MG PO TABS: 100.0000 mg | ORAL_TABLET | Freq: Two times a day (BID) | ORAL | 0 refills | Status: DC

## 2019-01-16 MED ORDER — VITAMIN D (ERGOCALCIFEROL) 1.25 MG (50000 UNIT) PO CAPS: 50000.0000 [IU] | ORAL_CAPSULE | ORAL | 0 refills | Status: DC

## 2019-01-16 MED ORDER — PREDNISONE 50 MG PO TABS: 50.0000 mg | ORAL_TABLET | Freq: Every day | ORAL | 0 refills | Status: DC

## 2019-01-16 NOTE — Assessment & Plan Note (Signed)
Concern the patient does have more of a [place] synovitis.  We discussed with patient in great length.  Patient does have a history of gout and the soft tissue swelling could be this for potential infectious etiology.  Patient did have a toe infection and that could have been the potential for seeding even though this is highly unlikely.  Antibiotics given, treatment for gout also given.  Patient does have what appears to be a synovitis at this time doing this could be secondary to a possible bone injury.  This was started atraumatically.  Vitamin D supplementation given.  Discussed which activities of doing which wants to avoid.  Follow-up again in 2 to 3 weeks possible need for advanced imaging

## 2019-01-16 NOTE — Patient Instructions (Signed)
Good to see you  Doxycycline 2 times a day for 3 weeks- be careful in the sun Prednison edaily for 5 days- stay inside away form people if you can  Once weekly vitamin D for 12 weeks Get K2 at [employer] and take daily for 1 week See me again in 3 weeks

## 2019-01-16 NOTE — Progress Notes (Signed)
[name] [address] [place], [place] [address] Phone: [phone] Subjective:   I [name] am serving as a Neurosurgeon for Dr. Antoine Primas.   CC: Neck and shoulder pain follow-up.  TMA:[id number]  Kyle Lawson is a 63 y.o. male coming in with complaint of neck and shoulder pain.  Patient did see another provider on November 27, 2018.  Diagnosed with acromioclavicular arthritis.  Given an injection on that date.  Patient was to continue with conservative therapy and home exercises.  Patient states [place] joint keeps him up at night. Taking pain meds that do not help. Been about 4 months since he fell.  Patient states if anything he may be worsening them actually getting better.     Past Medical History:  Diagnosis Date  . Arthritis   . GERD (gastroesophageal reflux disease)    occasional  . Gout   . Hypertension   . Kidney stone    Past Surgical History:  Procedure Laterality Date  . BACK SURGERY     1984 and 1996  . JOINT REPLACEMENT     left hip 09/2006  . TOTAL HIP ARTHROPLASTY  09/14/2011   Procedure: TOTAL HIP ARTHROPLASTY;  Surgeon: Loreta Ave, MD;  Location: [facility] OR;  Service: Orthopedics;  Laterality: Right;   Social History   Socioeconomic History  . Marital status: Married    Spouse name: Not on file  . Number of children: Not on file  . Years of education: Not on file  . Highest education level: Not on file  Occupational History  . Not on file  Social Needs  . Financial resource strain: Not on file  . Food insecurity:    Worry: Not on file    Inability: Not on file  . Transportation needs:    Medical: Not on file    Non-medical: Not on file  Tobacco Use  . Smoking status: Never Smoker  . Smokeless tobacco: Never Used  Substance and Sexual Activity  . Alcohol use: Yes    Comment: occasionally  . Drug use: Not on file  . Sexual activity: Not on file  Lifestyle  . Physical activity:    Days per week: Not on file   Minutes per session: Not on file  . Stress: Not on file  Relationships  . Social connections:    Talks on phone: Not on file    Gets together: Not on file    Attends religious service: Not on file    Active member of club or organization: Not on file    Attends meetings of clubs or organizations: Not on file    Relationship status: Not on file  Other Topics Concern  . Not on file  Social History Narrative  . Not on file   No Known Allergies No family history on file.  Current Outpatient Medications (Endocrine & Metabolic):  .  predniSONE (DELTASONE) 50 MG tablet, Take 1 tablet (50 mg total) by mouth daily.  Current Outpatient Medications (Cardiovascular):  .  lisinopril (PRINIVIL,ZESTRIL) 40 MG tablet, Take 40 mg by mouth every morning.   .  simvastatin (ZOCOR) 20 MG tablet, Take 20 mg by mouth every morning.     Current Outpatient Medications (Analgesics):  .  allopurinol (ZYLOPRIM) 300 MG tablet, Take 300 mg by mouth every morning.   .  indomethacin (INDOCIN) 50 MG capsule, Take 1 capsule (50 mg total) by mouth 3 (three) times daily with meals. (Patient taking differently: Take  50 mg by mouth as needed. ) .  meloxicam (MOBIC) 15 MG tablet, Take 15 mg by mouth 2 (two) times daily.   Current Outpatient Medications (Other):  [name].  Diclofenac Sodium (PENNSAID) 2 % SOLN, Place 1 application onto the skin 2 (two) times daily. [name].  gabapentin (NEURONTIN) 300 MG capsule, TAKE 1 CAPSULES BY MOUTH 3 TIMES DAILY AS NEEDED .  doxycycline (VIBRA-TABS) 100 MG tablet, Take 1 tablet (100 mg total) by mouth 2 (two) times daily for 21 days. .  Vitamin D, Ergocalciferol, (DRISDOL) 1.25 MG (50000 UT) CAPS capsule, Take 1 capsule (50,000 Units total) by mouth every 7 (seven) days.    Past medical history, social, surgical and family history all reviewed in electronic medical record.  No pertanent information unless stated regarding to the chief complaint.   Review of Systems:  No headache, visual  changes, nausea, vomiting, diarrhea, constipation, dizziness, abdominal pain, skin rash, fevers, chills, night sweats, weight loss, swollen lymph nodes, body aches, joint swelling, muscle aches, chest pain, shortness of breath, mood changes.   Objective  Blood pressure (!) 150/90, pulse 79, height 5\' 9"  (1.753 m), weight 187 lb (84.8 kg), SpO2 98 %.    General: No apparent distress alert and oriented x3 mood and affect normal, dressed appropriately.  HEENT: Pupils equal, extraocular movements intact  Respiratory: Patient's speak in full sentences and does not appear short of breath  Cardiovascular: No lower extremity edema, non tender, no erythema  Skin: Warm dry intact with no signs of infection or rash on extremities or on axial skeleton.  Abdomen: Soft nontender  Neuro: Cranial nerves II through XII are intact, neurovascularly intact in all extremities with 2+ DTRs and 2+ pulses.  Lymph: No lymphadenopathy of posterior or anterior cervical chain or axillae bilaterally.  Gait normal with good balance and coordination.  MSK:  Non tender with full range of motion and good stability and symmetric strength and tone of shoulders, elbows, wrist, hip, knee and ankles bilaterally.  Neck: Inspection unremarkable. No palpable stepoffs. Negative Spurling's maneuver. Full neck range of motion Grip strength and sensation normal in bilateral hands Strength good C4 to T1 distribution No sensory change to C4 to T1 Negative Hoffman sign bilaterally Reflexes normal  Shoulder: Right Inspection reveals no abnormalities, atrophy or asymmetry. Palpation is normal with no tenderness over AC joint or bicipital groove. ROM is full in all planes. Rotator cuff strength normal throughout. Very mild signs of impingement Speeds and Yergason's tests normal. No labral pathology noted with negative Obrien's, negative clunk and good stability. Normal scapular function observed. No painful arc and no drop arm  sign. No apprehension sign  Patient does have swelling over the [facility] joint on noted.  Does not appear to be dislocated.  Mild crepitus with range of motion.  Tender to palpation in the area.  Limited musculoskeletal ultrasound was performed and interpreted by Judi SaaZachary M   Limited ultrasound of patient's [place] joint shows the patient does have a fairly remarkable synovitis of the joint noted.  Increase in Doppler flow in the area.  Difficult to tell if there is a possibility for a cortical defect.  Soft tissue irritation and swelling in the area. Impression: Synovitis with soft tissue inflammation.     Impression and Recommendations:     This case required medical decision making of moderate complexity. The above documentation has been reviewed and is accurate and complete Judi SaaZachary M , DO       Note: This dictation was  prepared with Dragon dictation along with smaller phrase technology. Any transcriptional errors that result from this process are unintentional.

## 2019-01-23 ENCOUNTER — Encounter: Payer: Self-pay | Admitting: Family Medicine

## 2019-01-24 DIAGNOSIS — U071 COVID-19: Secondary | ICD-10-CM | POA: Diagnosis not present

## 2019-01-29 ENCOUNTER — Other Ambulatory Visit: Payer: Self-pay | Admitting: Family Medicine

## 2019-02-06 ENCOUNTER — Other Ambulatory Visit: Payer: Self-pay

## 2019-02-06 ENCOUNTER — Encounter: Payer: Self-pay | Admitting: Family Medicine

## 2019-02-06 ENCOUNTER — Ambulatory Visit (INDEPENDENT_AMBULATORY_CARE_PROVIDER_SITE_OTHER): Payer: BC Managed Care – PPO | Admitting: Family Medicine

## 2019-02-06 ENCOUNTER — Ambulatory Visit: Payer: Self-pay

## 2019-02-06 VITALS — BP 102/62 | HR 73 | Ht 69.0 in

## 2019-02-06 DIAGNOSIS — R0789 Other chest pain: Secondary | ICD-10-CM

## 2019-02-06 DIAGNOSIS — G8929 Other chronic pain: Secondary | ICD-10-CM

## 2019-02-06 DIAGNOSIS — M25511 Pain in right shoulder: Secondary | ICD-10-CM | POA: Diagnosis not present

## 2019-02-06 MED ORDER — PREDNISONE 50 MG PO TABS: 50.0000 mg | ORAL_TABLET | Freq: Every day | ORAL | 0 refills | Status: DC

## 2019-02-06 MED ORDER — DOXYCYCLINE HYCLATE 100 MG PO TABS: 100.0000 mg | ORAL_TABLET | Freq: Two times a day (BID) | ORAL | 0 refills | Status: AC

## 2019-02-06 NOTE — Assessment & Plan Note (Signed)
Patient is making significant improvement at this time.  We discussed with patient about keeping hemoglobin peripheral vision.  1 more day of doxycycline as well as the prednisone.  Patient is making progress.  If any worsening symptoms MRI of the [facility] joint will be necessary.  Follow-up with me again 6 weeks

## 2019-02-06 NOTE — Patient Instructions (Signed)
See me back in 6 weeks 

## 2019-02-06 NOTE — Progress Notes (Signed)
Tawana ScaleZach Voshon Petro D.O. [facility] [address] [place], [place] [address] Phone: [phone]) [phone] Subjective:   Bruce Donath, Valerie Wolf, am serving as a scribe for Dr. Antoine PrimasZachary Bryttany Tortorelli.    CC: neck pain   BJY:[id number]:Subjective   01/16/2019 Arlyn DunningJohn Ferrera is a 63 y.o. male coming in with complaint of right shoulder pain : Concern the patient does have more of a [place] synovitis.  We discussed with patient in great length.  Patient does have a history of gout and the soft tissue swelling could be this for potential infectious etiology.  Patient did have a toe infection and that could have been the potential for seeding even though this is highly unlikely.  Antibiotics given, treatment for gout also given.  Patient does have what appears to be a synovitis at this time doing this could be secondary to a possible bone injury.  This was started atraumatically.  Vitamin D supplementation given.  Discussed which activities of doing which wants to avoid.  Follow-up again in 2 to 3 weeks possible need for advanced imaging  Update 02/06/2019: for 4 months. Pain is over the SCM joint. Pain has gotten better. Pain increases with a lot of activity. Has been using Meloxicam for pain. Is also taking vitamin d and K2.     Mri cervical 3/30 IMPRESSION: 1. At C4-5 there is an eccentric right broad-based disc bulge. Moderate right facet arthropathy. Moderate right foraminal stenosis. 2. At C5-6 there is a broad-based disc bulge. Left uncovertebral degenerative changes and mild left facet arthropathy. Severe left foraminal stenosis. Moderate right foraminal stenosis. 3. At C3-4 there is a small right paracentral disc protrusion.  Past Medical History:  Diagnosis Date  . Arthritis   . GERD (gastroesophageal reflux disease)    occasional  . Gout   . Hypertension   . Kidney stone    Past Surgical History:  Procedure Laterality Date  . BACK SURGERY     1984 and 1996  . JOINT REPLACEMENT     left hip 09/2006  . TOTAL HIP  ARTHROPLASTY  09/14/2011   Procedure: TOTAL HIP ARTHROPLASTY;  Surgeon: Loreta Aveaniel F Murphy, MD;  [facility];  Service: Orthopedics;  Laterality: Right;   Social History   Socioeconomic History  . Marital status: Married    Spouse name: Not on file  . Number of children: Not on file  . Years of education: Not on file  . Highest education level: Not on file  Occupational History  . Not on file  Social Needs  . Financial resource strain: Not on file  . Food insecurity:    Worry: Not on file    Inability: Not on file  . Transportation needs:    Medical: Not on file    Non-medical: Not on file  Tobacco Use  . Smoking status: Never Smoker  . Smokeless tobacco: Never Used  Substance and Sexual Activity  . Alcohol use: Yes    Comment: occasionally  . Drug use: Not on file  . Sexual activity: Not on file  Lifestyle  . Physical activity:    Days per week: Not on file    Minutes per session: Not on file  . Stress: Not on file  Relationships  . Social connections:    Talks on phone: Not on file    Gets together: Not on file    Attends religious service: Not on file    Active member of club or organization: Not on file    Attends  meetings of clubs or organizations: Not on file    Relationship status: Not on file  Other Topics Concern  . Not on file  Social History Narrative  . Not on file   No Known Allergies No family history on file.  Current Outpatient Medications (Endocrine & Metabolic):  .  predniSONE (DELTASONE) 50 MG tablet, Take 1 tablet (50 mg total) by mouth daily.  Current Outpatient Medications (Cardiovascular):  .  lisinopril (PRINIVIL,ZESTRIL) 40 MG tablet, Take 40 mg by mouth every morning.   .  simvastatin (ZOCOR) 20 MG tablet, Take 20 mg by mouth every morning.     Current Outpatient Medications (Analgesics):  .  allopurinol (ZYLOPRIM) 300 MG tablet, Take 300 mg by mouth every morning.   .  indomethacin (INDOCIN) 50 MG capsule, Take 1 capsule (50 mg  total) by mouth 3 (three) times daily with meals. (Patient taking differently: Take 50 mg by mouth as needed. ) .  meloxicam (MOBIC) 15 MG tablet, Take 15 mg by mouth 2 (two) times daily.   Current Outpatient Medications (Other):  [name]  Diclofenac Sodium (PENNSAID) 2 % SOLN, Place 1 application onto the skin 2 (two) times daily. [name]  doxycycline (VIBRA-TABS) 100 MG tablet, Take 1 tablet (100 mg total) by mouth 2 (two) times daily for 21 days. [name]  gabapentin (NEURONTIN) 300 MG capsule, TAKE 1 CAPSULES BY MOUTH 3 TIMES DAILY AS NEEDED .  Vitamin D, Ergocalciferol, (DRISDOL) 1.25 MG (50000 UT) CAPS capsule, Take 1 capsule (50,000 Units total) by mouth every 7 (seven) days.    Past medical history, social, surgical and family history all reviewed in electronic medical record.  No pertanent information unless stated regarding to the chief complaint.   Review of Systems:  No headache, visual changes, nausea, vomiting, diarrhea, constipation, dizziness, abdominal pain, skin rash, fevers, chills, night sweats, weight loss, swollen lymph nodes, body aches, joint swelling,chest pain, shortness of breath, mood changes.  Positive muscle aches  Objective  Blood pressure 102/62, pulse 73, height 5\' 9"  (1.753 m), SpO2 97 %.    General: No apparent distress alert and oriented x3 mood and affect normal, dressed appropriately.  HEENT: Pupils equal, extraocular movements intact  Respiratory: Patient's speak in full sentences and does not appear short of breath  Cardiovascular: No lower extremity edema, non tender, no erythema  Skin: Warm dry intact with no signs of infection or rash on extremities or on axial skeleton.  Abdomen: Soft nontender  Neuro: Cranial nerves II through XII are intact, neurovascularly intact in all extremities with 2+ DTRs and 2+ pulses.  Lymph: No lymphadenopathy of posterior or anterior cervical chain or axillae bilaterally.  Gait normal with good balance and coordination.  MSK:  Non  tender with full range of motion and good stability and symmetric strength and tone of , elbows, wrist, hip, knee and ankles bilaterally.  Neck: Inspection loss of lordosis. No palpable stepoffs. Negative Spurling's maneuver. Full neck range of motion Grip strength and sensation normal in bilateral hands Strength good C4 to T1 distribution No sensory change to C4 to T1 Negative Hoffman sign bilaterally Reflexes normal Tightness of the trapezius on the right side Patient's right [place] joints no swelling compared to the contralateral side but significantly less than previous exam.  Not warm to touch anymore.  Good range of motion of the shoulder almost completely.  Limited musculoskeletal ultrasound was performed and interpreted by Judi Saa  Limited ultrasound shows that patient does still have an effusion of  the joint noted with increasing Doppler flow.  Probably approximately 50% decreasing from previous exam.  No significant bony abnormality.  Dynamic testing did not show any significant instability of the bone.       Impression and Recommendations:     . The above documentation has been reviewed and is accurate and complete Judi Saa, DO       Note: This dictation was prepared with Dragon dictation along with smaller phrase technology. Any transcriptional errors that result from this process are unintentional.

## 2019-02-14 DIAGNOSIS — M542 Cervicalgia: Secondary | ICD-10-CM | POA: Diagnosis not present

## 2019-02-14 DIAGNOSIS — M50122 Cervical disc disorder at C5-C6 level with radiculopathy: Secondary | ICD-10-CM | POA: Diagnosis not present

## 2019-02-28 ENCOUNTER — Ambulatory Visit: Payer: Self-pay

## 2019-02-28 ENCOUNTER — Telehealth: Payer: Self-pay | Admitting: Family Medicine

## 2019-02-28 ENCOUNTER — Other Ambulatory Visit: Payer: Self-pay

## 2019-02-28 ENCOUNTER — Ambulatory Visit: Payer: BC Managed Care – PPO | Admitting: Family Medicine

## 2019-02-28 ENCOUNTER — Encounter: Payer: Self-pay | Admitting: Family Medicine

## 2019-02-28 VITALS — BP 128/88 | HR 80 | Ht 69.0 in

## 2019-02-28 DIAGNOSIS — M25511 Pain in right shoulder: Secondary | ICD-10-CM | POA: Diagnosis not present

## 2019-02-28 DIAGNOSIS — G8929 Other chronic pain: Secondary | ICD-10-CM | POA: Diagnosis not present

## 2019-02-28 DIAGNOSIS — R0789 Other chest pain: Secondary | ICD-10-CM

## 2019-02-28 MED ORDER — PENNSAID 2 % TD SOLN: 1.0000 "application " | Freq: Two times a day (BID) | TRANSDERMAL | 2 refills | Status: DC

## 2019-02-28 NOTE — Telephone Encounter (Signed)
Medication: Diclofenac Sodium (PENNSAID) 2 % SOLN [[id number]]   Has the patient contacted their pharmacy? Yes  (Agent: If no, request that the patient contact the pharmacy for the refill.) (Agent: If yes, when and what did the pharmacy advise?)  Preferred Pharmacy (with phone number or street name): [facility], [place] [address] [phone]  Agent: Please be advised that RX refills may take up to 3 business days. We ask that you follow-up with your pharmacy.

## 2019-02-28 NOTE — Telephone Encounter (Signed)
Script called in

## 2019-02-28 NOTE — Patient Instructions (Addendum)
Pennsaid refilled  Tart cherry 1200 mg at night If flare in [place] take indomethacin first if not better take prednisone Enjoy the trip Have appointment set up in 6 weeks just in case, otherwise seem me when you need me

## 2019-02-28 NOTE — Progress Notes (Signed)
Kyle ScaleZach Lawson D.O. [facility] [address] [place], [place] [address] Phone: [phone]) [phone] Subjective:   Kyle Lawson, Kyle Lawson, am serving as a scribe for Dr. Antoine PrimasZachary Lawson.   CC: Right shoulder shoulder and sternal pain follow-up  [id number]:Subjective   02/06/2019  Patient is making significant improvement at this time.  We discussed with patient about keeping hemoglobin peripheral vision.  1 more day of doxycycline as well as the prednisone.  Patient is making progress.  If any worsening symptoms [facility] the [facility] will be necessary.  Follow-up with me again 6 weeks  Update 02/28/2019: Kyle DunningJohn Lawson is a 63 y.o. male coming in with complaint of sternal pain and popping over the [facility]. Does feel that pain has improved but is leaving town and does not want pain to get worse while he is out of town. Pain is intermittent. Pain occurs following activity. Sitting at his desk with his arm outstretched increases pain.  Patient states overall though would make significant improvement.  Patient denies any radiation down the arm noted.  Patient denies any fevers any chills any abnormal weight loss.     Past Medical History:  Diagnosis Date  . Arthritis   . GERD (gastroesophageal reflux disease)    occasional  . Gout   . Hypertension   . Kidney stone    Past Surgical History:  Procedure Laterality Date  . BACK SURGERY     1984 and 1996  . JOINT REPLACEMENT     left hip 09/2006  . TOTAL HIP ARTHROPLASTY  09/14/2011   Procedure: TOTAL HIP ARTHROPLASTY;  Surgeon: Kyle Aveaniel F Murphy, MD;  [facility];  Service: Orthopedics;  Laterality: Right;   Social History   Socioeconomic History  . Marital status: Married    Spouse name: Not on file  . Number of children: Not on file  . Years of education: Not on file  . Highest education level: Not on file  Occupational History  . Not on file  Social Needs  . Financial resource strain: Not on file  . Food insecurity    Worry: Not on file   Inability: Not on file  . Transportation needs    Medical: Not on file    Non-medical: Not on file  Tobacco Use  . Smoking status: Never Smoker  . Smokeless tobacco: Never Used  Substance and Sexual Activity  . Alcohol use: Yes    Comment: occasionally  . Drug use: Not on file  . Sexual activity: Not on file  Lifestyle  . Physical activity    Days per week: Not on file    Minutes per session: Not on file  . Stress: Not on file  Relationships  . Social Musicianconnections    Talks on phone: Not on file    Gets together: Not on file    Attends religious service: Not on file    Active member of club or organization: Not on file    Attends meetings of clubs or organizations: Not on file    Relationship status: Not on file  Other Topics Concern  . Not on file  Social History Narrative  . Not on file   No Known Allergies No family history on file.  Current Outpatient Medications (Endocrine & Metabolic):  .  predniSONE (DELTASONE) 50 MG tablet, Take 1 tablet (50 mg total) by mouth daily.  Current Outpatient Medications (Cardiovascular):  .  lisinopril (PRINIVIL,ZESTRIL) 40 MG tablet, Take 40 mg by mouth every morning.   .[name]  simvastatin (ZOCOR) 20 MG tablet, Take 20 mg by mouth every morning.     Current Outpatient Medications (Analgesics):  .  allopurinol (ZYLOPRIM) 300 MG tablet, Take 300 mg by mouth every morning.   .  indomethacin (INDOCIN) 50 MG capsule, Take 1 capsule (50 mg total) by mouth 3 (three) times daily with meals. (Patient taking differently: Take 50 mg by mouth as needed. ) .  meloxicam (MOBIC) 15 MG tablet, Take 15 mg by mouth 2 (two) times daily.   Current Outpatient Medications (Other):  [name]  Diclofenac Sodium (PENNSAID) 2 % SOLN, Place 1 application onto the skin 2 (two) times daily. [name]  gabapentin (NEURONTIN) 300 MG capsule, TAKE 1 CAPSULES BY MOUTH 3 TIMES DAILY AS NEEDED .  Vitamin D, Ergocalciferol, (DRISDOL) 1.25 MG (50000 UT) CAPS capsule, Take 1 capsule  (50,000 Units total) by mouth every 7 (seven) days.    Past medical history, social, surgical and family history all reviewed in electronic medical record.  No pertanent information unless stated regarding to the chief complaint.   Review of Systems:  No headache, visual changes, nausea, vomiting, diarrhea, constipation, dizziness, abdominal pain, skin rash, fevers, chills, night sweats, weight loss, swollen lymph nodes, body aches, joint swelling, muscle aches, chest pain, shortness of breath, mood changes.   Objective  There were no vitals taken for this visit. Systems examined below as of    General: No apparent distress alert and oriented x3 mood and affect normal, dressed appropriately.  HEENT: Pupils equal, extraocular movements intact  Respiratory: Patient's speak in full sentences and does not appear short of breath  Cardiovascular: No lower extremity edema, non tender, no erythema  Skin: Warm dry intact with no signs of infection or rash on extremities or on axial skeleton.  Abdomen: Soft nontender  Neuro: Cranial nerves II through XII are intact, neurovascularly intact in all extremities with 2+ DTRs and 2+ pulses.  Lymph: No lymphadenopathy of posterior or anterior cervical chain or axillae bilaterally.  Gait normal with good balance and coordination.  MSK: Focused on patient's right sternoclavicular joint.  No significant decrease in swelling from previous exam. Limited musculoskeletal ultrasound was performed and interpreted by Kyle Lawson  Limited ultrasound of patient's sternoclavicular joint shows the patient does still have some swelling noted.  Possible uric acid deposits noted.  Significant decrease hypoechoic changes in Doppler flow from previous exam   Impression and Recommendations:     This case required medical decision making of moderate complexity. The above documentation has been reviewed and is accurate and complete Kyle Pulley, DO       Note:  This dictation was prepared with Dragon dictation along with smaller phrase technology. Any transcriptional errors that result from this process are unintentional.

## 2019-02-28 NOTE — Assessment & Plan Note (Signed)
Seems to be doing well at this time.  Continue with same therapy at this moment.  Discussed over-the-counter medications that could help with the gout.  I do not see any infectious etiology.  We discussed action plan for any type of flares.  Continue to have trouble MRI will be necessary

## 2019-03-22 ENCOUNTER — Ambulatory Visit: Payer: BC Managed Care – PPO | Admitting: Family Medicine

## 2019-04-03 ENCOUNTER — Other Ambulatory Visit: Payer: Self-pay | Admitting: Family Medicine

## 2019-05-01 ENCOUNTER — Encounter: Payer: Self-pay | Admitting: Family Medicine

## 2019-05-01 ENCOUNTER — Ambulatory Visit: Payer: BC Managed Care – PPO | Admitting: Family Medicine

## 2019-05-01 ENCOUNTER — Ambulatory Visit: Payer: Self-pay

## 2019-05-01 ENCOUNTER — Other Ambulatory Visit: Payer: Self-pay

## 2019-05-01 VITALS — BP 132/94 | HR 70 | Ht 69.0 in | Wt 187.0 lb

## 2019-05-01 DIAGNOSIS — R0789 Other chest pain: Secondary | ICD-10-CM | POA: Diagnosis not present

## 2019-05-01 DIAGNOSIS — M25511 Pain in right shoulder: Secondary | ICD-10-CM

## 2019-05-01 DIAGNOSIS — G8929 Other chronic pain: Secondary | ICD-10-CM | POA: Diagnosis not present

## 2019-05-01 NOTE — Patient Instructions (Addendum)
Injected [place] joint today Send me a mychart message on Monday If not better will get further imaging

## 2019-05-01 NOTE — Progress Notes (Signed)
[facility] [place], [place] [address] Phone: [phone] Subjective:   Kyle Lawson, am serving as a scribe for Dr. Hulan Saas.   CC: Sternoclavicular pain follow-up  [id number]   02/28/2019 Seems to be doing well at this time.  Continue with same therapy at this moment.  Discussed over-the-counter medications that could help with the gout.  I do not see any infectious etiology.  We discussed action plan for any type of flares.  Continue to have trouble MRI will be necessary  Update 05/01/2019 Kyle Lawson is a 63 y.o. male coming in with complaint of right shoulder pain. Patient continues to have  joint and AC joint pain on the right side. Had injection in February he states. Achy constant pain but can become sharp with stretching. Does use Allopurinol daily. Does not have pain with activity but the day after he will be in pain. Feels that he has plateaued. Uses indomethacin daily.  Patient states that it just does not does seem to be improving.  Patient has done everything with his gout with Lawson significant improvement.  Did get laboratory work-up from his place of employment and found to have a uric acid level of 5 continues to use the allopurinol daily.     Past Medical History:  Diagnosis Date  . Arthritis   . GERD (gastroesophageal reflux disease)    occasional  . Gout   . Hypertension   . Kidney stone    Past Surgical History:  Procedure Laterality Date  . [place] and 1996  . JOINT REPLACEMENT     left hip 09/2006  . TOTAL HIP ARTHROPLASTY  09/14/2011   Procedure: TOTAL HIP ARTHROPLASTY;  Surgeon: Ninetta Lights, MD;  Location: [facility];  Service: Orthopedics;  Laterality: Right;   Social History   Socioeconomic History  . Marital status: Married    Spouse name: Not on file  . Number of children: Not on file  . Years of education: Not on file  . Highest education level: Not on file  Occupational History   . Not on file  Social Needs  . Financial resource strain: Not on file  . Food insecurity    Worry: Not on file    Inability: Not on file  . Transportation needs    Medical: Not on file    Non-medical: Not on file  Tobacco Use  . Smoking status: Never Smoker  . Smokeless tobacco: Never Used  Substance and Sexual Activity  . Alcohol use: Yes    Comment: occasionally  . Drug use: Not on file  . Sexual activity: Not on file  Lifestyle  . Physical activity    Days per week: Not on file    Minutes per session: Not on file  . Stress: Not on file  Relationships  . Social Herbalist on phone: Not on file    Gets together: Not on file    Attends religious service: Not on file    Active member of club or organization: Not on file    Attends meetings of clubs or organizations: Not on file    Relationship status: Not on file  Other Topics Concern  . Not on file  Social History Narrative  . Not on file   Lawson Known Allergies Lawson family history on file.  Current Outpatient Medications (Endocrine & Metabolic):  .  predniSONE (DELTASONE) 50 MG tablet, Take  1 tablet (50 mg total) by mouth daily.  Current Outpatient Medications (Cardiovascular):  .  lisinopril (PRINIVIL,ZESTRIL) 40 MG tablet, Take 40 mg by mouth every morning.   .  simvastatin (ZOCOR) 20 MG tablet, Take 20 mg by mouth every morning.     Current Outpatient Medications (Analgesics):  .  allopurinol (ZYLOPRIM) 300 MG tablet, Take 300 mg by mouth every morning.   .  indomethacin (INDOCIN) 50 MG capsule, Take 1 capsule (50 mg total) by mouth 3 (three) times daily with meals. (Patient taking differently: Take 50 mg by mouth as needed. ) .  meloxicam (MOBIC) 15 MG tablet, Take 15 mg by mouth 2 (two) times daily.   Current Outpatient Medications (Other):  [name].  Diclofenac Sodium (PENNSAID) 2 % SOLN, Place 1 application onto the skin 2 (two) times daily. [name].  gabapentin (NEURONTIN) 300 MG capsule, TAKE 1 CAPSULES BY  MOUTH 3 TIMES DAILY AS NEEDED .  Vitamin D, Ergocalciferol, (DRISDOL) 1.25 MG (50000 UT) CAPS capsule, TAKE 1 CAPSULE (50,000 UNITS TOTAL) BY MOUTH EVERY 7 (SEVEN) DAYS.    Past medical history, social, surgical and family history all reviewed in electronic medical record.  Lawson pertanent information unless stated regarding to the chief complaint.   Review of Systems:  Lawson headache, visual changes, nausea, vomiting, diarrhea, constipation, dizziness, abdominal pain, skin rash, fevers, chills, night sweats, weight loss, swollen lymph nodes, body aches, joint swelling, muscle aches, chest pain, shortness of breath, mood changes.   Objective  Blood pressure (!) 132/94, pulse 70, height 5\' 9"  (1.753 m), weight 187 lb (84.8 kg), SpO2 97 %.    General: Lawson apparent distress alert and oriented x3 mood and affect normal, dressed appropriately.  HEENT: Pupils equal, extraocular movements intact  Respiratory: Patient's speak in full sentences and does not appear short of breath  Cardiovascular: Lawson lower extremity edema, non tender, Lawson erythema  Skin: Warm dry intact with Lawson signs of infection or rash on extremities or on axial skeleton.  Abdomen: Soft nontender  Neuro: Cranial nerves II through XII are intact, neurovascularly intact in all extremities with 2+ DTRs and 2+ pulses.  Lymph: Lawson lymphadenopathy of posterior or anterior cervical chain or axillae bilaterally.  Gait normal with good balance and coordination.  MSK:  Non tender with full range of motion and good stability and symmetric strength and tone of  elbows, wrist, hip, knee and ankles bilaterally.  Right shoulder exam shows the patient does have some swelling over the acromioclavicular joint which is new from previous exam.  Mild increase in accumulation of swelling around the [facility] joint as well.  Tender to palpation in this area.  Patient still has the abnormality in the mid clavicle noted but nontender in that area.  More tender over the [place]  joint.  Procedure: Real-time Ultrasound Guided Injection of right sided [place] joint Device: GE Logiq Q7 Ultrasound guided injection is preferred based studies that show increased duration, increased effect, greater accuracy, decreased procedural pain, increased response rate, and decreased cost with ultrasound guided versus blind injection.  Verbal informed consent obtained.  Time-out conducted.  Noted Lawson overlying erythema, induration, or other signs of local infection.  Skin prepped in a sterile fashion.  Local anesthesia: Topical Ethyl chloride.  With sterile technique and under real time ultrasound guidance: With a 25-gauge 5/8 inch needle patient was injected with 0.5 cc of 0.5% Marcaine and 0.5 cc of Kenalog 40 mg/mL Completed without difficulty  Pain immediately improved suggesting accurate placement of  the medication.  Advised to call if fevers/chills, erythema, induration, drainage, or persistent bleeding.  Images permanently stored and available for review in the ultrasound unit.  Impression: Technically successful ultrasound guided injection.    Impression and Recommendations:     This case required medical decision making of moderate complexity. The above documentation has been reviewed and is accurate and complete Judi Saa, DO       Note: This dictation was prepared with Dragon dictation along with smaller phrase technology. Any transcriptional errors that result from this process are unintentional.

## 2019-05-01 NOTE — Assessment & Plan Note (Addendum)
Patient given injection today and tolerated the procedure well.  Discussed icing regimen and home exercises, discussed which activities of doing which wants to avoid.  Patient is to increase activity slowly over the course the next several weeks.  Follow-up with me again in 4 to 8 weeks if patient continues to have pain I would consider the possibility of advanced imaging which would likely be a CT chest with concentration of the [place] joint and clavicle

## 2019-05-06 ENCOUNTER — Encounter: Payer: Self-pay | Admitting: Family Medicine

## 2019-07-11 NOTE — Progress Notes (Signed)
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   CC: Right shoulder pain  TIR:[id number]   05/01/2019 Patient given injection today and tolerated the procedure well.  Discussed icing regimen and home exercises, discussed which activities of doing which wants to avoid.  Patient is to increase activity slowly over the course the next several weeks.  Follow-up with me again in 4 to 8 weeks if patient continues to have pain I would consider the possibility of advanced imaging which would likely be a CT chest with concentration of the [place] joint and clavicle  07/12/2019 Aristidis Talerico is a 63 y.o. male coming in with complaint of right shoulder pain. Patient states he has continued pain in the shoulder. States injection helped some. Has more pain in the [facility] joint.  Patient states crossing over moving quickly sometimes gives him more discomfort and more again laterally than medially.     Past Medical History:  Diagnosis Date  . Arthritis   . GERD (gastroesophageal reflux disease)    occasional  . Gout   . Hypertension   . Kidney stone    Past Surgical History:  Procedure Laterality Date  . [place] and 1996  . JOINT REPLACEMENT     left hip 09/2006  . TOTAL HIP ARTHROPLASTY  09/14/2011   Procedure: TOTAL HIP ARTHROPLASTY;  Surgeon: Ninetta Lights, MD;  Location: [facility];  Service: Orthopedics;  Laterality: Right;   Social History   Socioeconomic History  . Marital status: Married    Spouse name: Not on file  . Number of children: Not on file  . Years of education: Not on file  . Highest education level: Not on file  Occupational History  . Not on file  Social Needs  . Financial resource strain: Not on file  . Food insecurity    Worry: Not on file    Inability: Not on file  . Transportation needs    Medical: Not on file    Non-medical: Not  on file  Tobacco Use  . Smoking status: Never Smoker  . Smokeless tobacco: Never Used  Substance and Sexual Activity  . Alcohol use: Yes    Comment: occasionally  . Drug use: Not on file  . Sexual activity: Not on file  Lifestyle  . Physical activity    Days per week: Not on file    Minutes per session: Not on file  . Stress: Not on file  Relationships  . Social Herbalist on phone: Not on file    Gets together: Not on file    Attends religious service: Not on file    Active member of club or organization: Not on file    Attends meetings of clubs or organizations: Not on file    Relationship status: Not on file  Other Topics Concern  . Not on file  Social History Narrative  . Not on file   No Known Allergies No family history on file.  Current Outpatient Medications (Endocrine & Metabolic):  .  predniSONE (DELTASONE) 50 MG tablet, Take 1 tablet (50 mg total) by mouth daily.  Current Outpatient Medications (Cardiovascular):  .  lisinopril (PRINIVIL,ZESTRIL) 40 MG tablet, Take 40 mg by mouth every morning.   .  simvastatin (ZOCOR) 20 MG tablet, Take 20 mg by mouth every morning.  Current Outpatient Medications (Analgesics):  .  allopurinol (ZYLOPRIM) 300 MG tablet, Take 300 mg by mouth every morning.   .  indomethacin (INDOCIN) 50 MG capsule, Take 1 capsule (50 mg total) by mouth 3 (three) times daily with meals. (Patient taking differently: Take 50 mg by mouth as needed. ) .  meloxicam (MOBIC) 15 MG tablet, Take 15 mg by mouth 2 (two) times daily.   Current Outpatient Medications (Other):  [name]  Diclofenac Sodium (PENNSAID) 2 % SOLN, Place 1 application onto the skin 2 (two) times daily. [name]  gabapentin (NEURONTIN) 300 MG capsule, TAKE 1 CAPSULES BY MOUTH 3 TIMES DAILY AS NEEDED .  Vitamin D, Ergocalciferol, (DRISDOL) 1.25 MG (50000 UT) CAPS capsule, TAKE 1 CAPSULE (50,000 UNITS TOTAL) BY MOUTH EVERY 7 (SEVEN) DAYS.    Past medical history, social, surgical  and family history all reviewed in electronic medical record.  No pertanent information unless stated regarding to the chief complaint.   Review of Systems:  No headache, visual changes, nausea, vomiting, diarrhea, constipation, dizziness, abdominal pain, skin rash, fevers, chills, night sweats, weight loss, swollen lymph nodes, body aches, joint swelling, muscle aches, chest pain, shortness of breath, mood changes.   Objective  Blood pressure (!) 150/90, pulse 75, height 5\' 9"  (1.753 m), SpO2 98 %.    General: No apparent distress alert and oriented x3 mood and affect normal, dressed appropriately.  HEENT: Pupils equal, extraocular movements intact  Respiratory: Patient's speak in full sentences and does not appear short of breath  Cardiovascular: No lower extremity edema, non tender, no erythema  Skin: Warm dry intact with no signs of infection or rash on extremities or on axial skeleton.  Abdomen: Soft nontender  Neuro: Cranial nerves II through XII are intact, neurovascularly intact in all extremities with 2+ DTRs and 2+ pulses.  Lymph: No lymphadenopathy of posterior or anterior cervical chain or axillae bilaterally.  Gait normal with good balance and coordination.  MSK:  Non tender with full range of motion and good stability and symmetric strength and tone of  elbows, wrist, hip, knee and ankles bilaterally.   Right shoulder exam shows the patient has significant decrease in swelling around the [place] joint and from previous exam.  Patient does have increased swelling over the acromioclavicular joint.  Positive crossover and tenderness over the acromioclavicular joint.  Patient's no signs of impingement of the rotator cuff and 5 out of 5 strength of the rotator cuff.  Limited musculoskeletal ultrasound was performed and interpreted by  Limited ultrasound of patient's [place] joint shows significant decrease in swelling from previous exam but still mild.  Acromioclavicular joint  does have some positive capsular distention noted.  No cortical irregularities though noted  Procedure: Real-time Ultrasound Guided Injection of right acromioclavicular joint Device: GE Logiq Q7 Ultrasound guided injection is preferred based studies that show increased duration, increased effect, greater accuracy, decreased procedural pain, increased response rate, and decreased cost with ultrasound guided versus blind injection.  Verbal informed consent obtained.  Time-out conducted.  Noted no overlying erythema, induration, or other signs of local infection.  Skin prepped in a sterile fashion.  Local anesthesia: Topical Ethyl chloride.  With sterile technique and under real time ultrasound guidance: With a 25-gauge half inch needle injected with 0.5 cc of 0.5% Marcaine and 0.5 cc of Kenalog 40 mg/mL Completed without difficulty  Pain immediately resolved suggesting accurate placement of the medication.  Advised to call if fevers/chills, erythema, induration, drainage, or persistent bleeding.  Images permanently stored and available for review in the ultrasound unit.  Impression: Technically successful ultrasound guided injection.    Impression and Recommendations:     This case required medical decision making of moderate complexity. The above documentation has been reviewed and is accurate and complete Judi SaaZachary M Williams Dietrick, DO       Note: This dictation was prepared with Dragon dictation along with smaller phrase technology. Any transcriptional errors that result from this process are unintentional.

## 2019-07-12 ENCOUNTER — Ambulatory Visit: Payer: BC Managed Care – PPO | Admitting: Family Medicine

## 2019-07-12 ENCOUNTER — Encounter: Payer: Self-pay | Admitting: Family Medicine

## 2019-07-12 ENCOUNTER — Other Ambulatory Visit: Payer: Self-pay

## 2019-07-12 ENCOUNTER — Ambulatory Visit: Payer: Self-pay

## 2019-07-12 VITALS — BP 150/90 | HR 75 | Ht 69.0 in

## 2019-07-12 DIAGNOSIS — G8929 Other chronic pain: Secondary | ICD-10-CM

## 2019-07-12 DIAGNOSIS — M25511 Pain in right shoulder: Secondary | ICD-10-CM | POA: Diagnosis not present

## 2019-07-12 NOTE — Patient Instructions (Signed)
Good to see you  Ice is yoru friend Tried AC injection today  Vitamin D 2000-4000 IU daily  See me again in 6 weeks

## 2019-07-13 ENCOUNTER — Encounter: Payer: Self-pay | Admitting: Family Medicine

## 2019-07-13 DIAGNOSIS — M25519 Pain in unspecified shoulder: Secondary | ICD-10-CM | POA: Insufficient documentation

## 2019-07-13 NOTE — Assessment & Plan Note (Signed)
Patient given injection today.  Tolerated the procedure well.  Discussed which activities to do which wants to avoid.  Discussed icing regimen.  Patient is to increase activity slowly over the course the next 10 days.  Topical anti-inflammatories, follow-up with me again 4 to 8 weeks

## 2019-08-17 DIAGNOSIS — H60501 Unspecified acute noninfective otitis externa, right ear: Secondary | ICD-10-CM | POA: Diagnosis not present

## 2019-08-17 DIAGNOSIS — H66001 Acute suppurative otitis media without spontaneous rupture of ear drum, right ear: Secondary | ICD-10-CM | POA: Diagnosis not present

## 2019-09-03 ENCOUNTER — Other Ambulatory Visit: Payer: Self-pay | Admitting: Family Medicine

## 2019-10-10 ENCOUNTER — Ambulatory Visit (INDEPENDENT_AMBULATORY_CARE_PROVIDER_SITE_OTHER): Payer: BC Managed Care – PPO

## 2019-10-10 ENCOUNTER — Encounter: Payer: Self-pay | Admitting: Family Medicine

## 2019-10-10 ENCOUNTER — Other Ambulatory Visit: Payer: Self-pay

## 2019-10-10 ENCOUNTER — Ambulatory Visit (INDEPENDENT_AMBULATORY_CARE_PROVIDER_SITE_OTHER): Payer: BC Managed Care – PPO | Admitting: Family Medicine

## 2019-10-10 VITALS — BP 160/90 | HR 90 | Ht 69.0 in | Wt 186.0 lb

## 2019-10-10 DIAGNOSIS — M5442 Lumbago with sciatica, left side: Secondary | ICD-10-CM

## 2019-10-10 DIAGNOSIS — M5441 Lumbago with sciatica, right side: Secondary | ICD-10-CM

## 2019-10-10 DIAGNOSIS — G8929 Other chronic pain: Secondary | ICD-10-CM

## 2019-10-10 MED ORDER — PREDNISONE 50 MG PO TABS: ORAL_TABLET | ORAL | 0 refills | Status: DC

## 2019-10-10 MED ORDER — METHYLPREDNISOLONE ACETATE 80 MG/ML IJ SUSP
80.0000 mg | Freq: Once | INTRAMUSCULAR | Status: AC
  Administered 2019-10-10: 13:00:00 80 mg via INTRAMUSCULAR

## 2019-10-10 MED ORDER — KETOROLAC TROMETHAMINE 60 MG/2ML IM SOLN
60.0000 mg | Freq: Once | INTRAMUSCULAR | Status: AC
  Administered 2019-10-10: 13:00:00 60 mg via INTRAMUSCULAR

## 2019-10-10 MED ORDER — VENLAFAXINE HCL ER 37.5 MG PO CP24: 37.5000 mg | ORAL_CAPSULE | Freq: Every day | ORAL | 0 refills | Status: DC

## 2019-10-10 NOTE — Progress Notes (Signed)
Kyle Lawson [address] 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  This visit occurred during the SARS-CoV-2 public health emergency.  Safety protocols were in place, including screening questions prior to the visit, additional usage of staff PPE, and extensive cleaning of exam room while observing appropriate contact time as indicated for disinfecting solutions.   I'm seeing this patient by the request  of:  Waldemar Dickens, MD  CC: Low back pain  FAO:[id number]   07/12/2019 Patient given injection today.  Tolerated the procedure well.  Discussed which activities to do which wants to avoid.  Discussed icing regimen.  Patient is to increase activity slowly over the course the next 10 days.  Topical anti-inflammatories, follow-up with me again 4 to 8 weeks  Update 10/10/2019 Kyle Lawson is a 64 y.o. male coming in with complaint of back pain. Patient states his back pain is chronic. Has had epidurals and MRIs. January 4th he had severe back spasms. Has tried dry needling, ice, heat, TENS unit, massage, stretching, oxycodone and Prednisone. Patient states he has DDD. Left sided sciatica with numbness and tingling bilaterally in the feet. Right thigh pain. At night he has leg pain that feels as if his "legs are on fire". Patient states he would like an MRI. Has tried a lot of different modalities and has noted that nothing has actually helped. Would like a lumbar fusion. Shoulder is better than it was. Still in pain but the pain is more manageable.  joint still clicks but pain has decreased. AC joint still painful.     Reviewed patient's chart in great detail.  Patient has had pain in his back for quite some time degenerative disc disease with previous laminectomies at L4-L5 in 1984 and 2004 in 1996.  Patient had an episode of exacerbation in 2018 and had to epidural injections.  Showed  progression on most recent MRI in 2018.  This was independently visualized by me unfortunately now had another exacerbation started on January 4 with back spasms and now anytime down flat has radicular symptoms to the right thigh and the left buttocks to the left foot.  Denies any significant weakness of the lower extremities.  Has not been able to workout regularly.  Had to take some pain medications that he is kept for some time.  Past Medical History:  Diagnosis Date  . Arthritis   . GERD (gastroesophageal reflux disease)    occasional  . Gout   . Hypertension   . Kidney stone    Past Surgical History:  Procedure Laterality Date  . [place] and 1996  . JOINT REPLACEMENT     left hip 09/2006  . TOTAL HIP ARTHROPLASTY  09/14/2011   Procedure: TOTAL HIP ARTHROPLASTY;  Surgeon: Ninetta Lights, MD;  Location: [facility];  Service: Orthopedics;  Laterality: Right;   Social History   Socioeconomic History  . Marital status: Married    Spouse name: Not on file  . Number of children: Not on file  . Years of education: Not on file  . Highest education level: Not on file  Occupational History  . Not on file  Tobacco Use  . Smoking status: Never Smoker  . Smokeless tobacco: Never Used  Substance and Sexual Activity  . Alcohol use: Yes    Comment: occasionally  . Drug use: Not on file  .  Sexual activity: Not on file  Other Topics Concern  . Not on file  Social History Narrative  . Not on file   Social Determinants of Health   Financial Resource Strain:   . Difficulty of Paying Living Expenses: Not on file  Food Insecurity:   . Worried About Programme researcher, broadcasting/film/video in the Last Year: Not on file  . Ran Out of Food in the Last Year: Not on file  Transportation Needs:   . Lack of Transportation (Medical): Not on file  . Lack of Transportation (Non-Medical): Not on file  Physical Activity:   . Days of Exercise per Week: Not on file  . Minutes of Exercise per Session: Not on  file  Stress:   . Feeling of Stress : Not on file  Social Connections:   . Frequency of Communication with Friends and Family: Not on file  . Frequency of Social Gatherings with Friends and Family: Not on file  . Attends Religious Services: Not on file  . Active Member of Clubs or Organizations: Not on file  . Attends Banker Meetings: Not on file  . Marital Status: Not on file   No Known Allergies No family history on file.  Current Outpatient Medications (Endocrine & Metabolic):  .  predniSONE (DELTASONE) 50 MG tablet, Take 1 tablet (50 mg total) by mouth daily. .  predniSONE (DELTASONE) 50 MG tablet, 1 tablet by mouth daily  Current Outpatient Medications (Cardiovascular):  .  lisinopril (PRINIVIL,ZESTRIL) 40 MG tablet, Take 40 mg by mouth every morning.   .  simvastatin (ZOCOR) 20 MG tablet, Take 20 mg by mouth every morning.     Current Outpatient Medications (Analgesics):  .  allopurinol (ZYLOPRIM) 300 MG tablet, Take 300 mg by mouth every morning.   .  indomethacin (INDOCIN) 50 MG capsule, Take 1 capsule (50 mg total) by mouth 3 (three) times daily with meals. (Patient taking differently: Take 50 mg by mouth as needed. ) .  meloxicam (MOBIC) 15 MG tablet, Take 15 mg by mouth 2 (two) times daily.   Current Outpatient Medications (Other):  [name]  Diclofenac Sodium (PENNSAID) 2 % SOLN, Place 1 application onto the skin 2 (two) times daily. [name]  gabapentin (NEURONTIN) 300 MG capsule, TAKE 1 CAPSULES BY MOUTH 3 TIMES DAILY AS NEEDED .  PENNSAID 2 % SOLN, APPLY 1 PUMP (1 GRAM) TO AFFECTED AREA TOPICALLY TWICE DAILY AS DIRECTED .  Vitamin D, Ergocalciferol, (DRISDOL) 1.25 MG (50000 UT) CAPS capsule, TAKE 1 CAPSULE (50,000 UNITS TOTAL) BY MOUTH EVERY 7 (SEVEN) DAYS. [name]  venlafaxine XR (EFFEXOR XR) 37.5 MG 24 hr capsule, Take 1 capsule (37.5 mg total) by mouth daily with breakfast.   Reviewed prior external information including notes and imaging from  primary care  provider As well as notes that were available from care everywhere and other healthcare systems.  Past medical history, social, surgical and family history all reviewed in electronic medical record.  No pertanent information unless stated regarding to the chief complaint.   Review of Systems:  No headache, visual changes, nausea, vomiting, diarrhea, constipation, dizziness, abdominal pain, skin rash, fevers, chills, night sweats, weight loss, swollen lymph nodes, body aches, joint swelling, chest pain, shortness of breath, mood changes. POSITIVE muscle aches  Objective  Blood pressure (!) 160/90, pulse 90, height 5\' 9"  (1.753 m), weight 186 lb (84.4 kg), SpO2 98 %.   General: No apparent distress alert and oriented x3 mood and affect normal, dressed appropriately.  HEENT: Pupils equal, extraocular movements intact  Respiratory: Patient's speak in full sentences and does not appear short of breath  Cardiovascular: No lower extremity edema, non tender, no erythema  Skin: Warm dry intact with no signs of infection or rash on extremities or on axial skeleton.  Abdomen: Soft nontender  Lymph: No lymphadenopathy of posterior or anterior cervical chain or axillae bilaterally.  Gait normal with good balance and coordination.  MSK:  Non tender with full range of motion and good stability and symmetric strength and tone of , elbows, wrist, hip, knee and ankles bilaterally.  Low back exam does show some loss of lordosis.  Patient does have some pain with resisted extension of 5 degrees even.  Patient does have near full flexion but still lacks last 5 degrees.  Patient has tightness with Pearlean Brownie test and mild radicular symptoms on the left side with straight leg test at 25 degrees.  Deep tendon reflexes are intact but brisk.  No foot drop noted.  No beats of clonus  [id number]; 15 additional minutes spent for Therapeutic exercises as stated in above notes.  This included exercises focusing on stretching,  strengthening, with significant focus on eccentric aspects.   Long term goals include an improvement in range of motion, strength, endurance as well as avoiding reinjury. Patient's frequency would include in 1-2 times a day, 3-5 times a week for a duration of 6-12 weeks. Low back exercises that included:  Pelvic tilt/bracing instruction to focus on control of the pelvic girdle and lower abdominal muscles  Glute strengthening exercises, focusing on proper firing of the glutes without engaging the low back muscles Proper stretching techniques for maximum relief for the hamstrings, hip flexors, low back and some rotation where tolerated   Proper technique shown and discussed handout in great detail with ATC.  All questions were discussed and answered.     Impression and Recommendations:     This case required medical decision making of moderate complexity. The above documentation has been reviewed and is accurate and complete Judi Saa, DO       Note: This dictation was prepared with Dragon dictation along with smaller phrase technology. Any transcriptional errors that result from this process are unintentional.

## 2019-10-10 NOTE — Patient Instructions (Addendum)
Good to see you Effexor 37.5 Take gabapentin 300 mg at night Exercise 3 times a week Prednisone daily for 5 days Xray today See me again in 3 weeks

## 2019-10-10 NOTE — Assessment & Plan Note (Signed)
Having exacerbation of the back pain again at this time.  Encourage patient to restart the gabapentin, prednisone 5 days given and warned him to hold the meloxicam during this time.  Discussed with patient about other over-the-counter medications that could be beneficial given home exercises.  Patient's MRIs have shown that patient likely does have nerve root impingement that could be contributing to a decent amount of the pain as well.  Patient's last MRI over 13 years old and will need to consider the possibility of repeating.  We will try the conservative approach first but have patient follow-up again in the near future.  Patient knows that left foot numbness tingling and pain seems to get significantly worse or now with him having bilateral pain with extension of the back he needs to see [name] or seek medical attention immediately.  Patient in agreement with the plan.  Could be a candidate for nerve root and epidurals as well.  New imaging is necessary before surgical intervention.  History of laminectomies in 1984 in 1996

## 2019-10-11 ENCOUNTER — Encounter: Payer: Self-pay | Admitting: Family Medicine

## 2019-11-04 NOTE — Progress Notes (Signed)
Kyle Lawson [address] [place] [address] Phone: [phone] Subjective:   I Kyle Lawson am serving as a Neurosurgeon for Dr. Antoine Primas.  This visit occurred during the SARS-CoV-2 public health emergency.  Safety protocols were in place, including screening questions prior to the visit, additional usage of staff PPE, and extensive cleaning of exam room while observing appropriate contact time as indicated for disinfecting solutions.   I'm seeing this patient by the request  of:  Kyle Rocks, MD  CC: Low back pain follow-up  [id number]   10/10/2019 Having exacerbation of the back pain again at this time.  Encourage patient to restart the gabapentin, prednisone 5 days given and warned him to hold the meloxicam during this time.  Discussed with patient about other over-the-counter medications that could be beneficial given home exercises.  Patient's MRIs have shown that patient likely does have nerve root impingement that could be contributing to a decent amount of the pain as well.  Patient's last MRI over 6 years old and will need to consider the possibility of repeating.  We will try the conservative approach first but have patient follow-up again in the near future.  Patient knows that left foot numbness tingling and pain seems to get significantly worse or now with him having bilateral pain with extension of the back he needs to see Kyle Lawson or seek medical attention immediately.  Patient in agreement with the plan.  Could be a candidate for nerve root and epidurals as well.  New imaging is necessary before surgical intervention.  History of laminectomies in 1984 in 1996  Update 11/05/2019 [place] is a 64 y.o. male coming in with complaint of back pain. Patient states the intensity of the pain has decreased but has not made much progress. Numbness and tingling in left foot. Right thigh pain and some foot tingling. Uses a lot of heat, ice, TENS unit,  massage and medications to manage his pain.  Patient states that it is still chronic, anything else seems to continue to give him significant difficulty.  Patient states that it affects daily activities as well as his sleep.   Discussed medical management including gabapentin and was given prednisone after last exam.  Past Medical History:  Diagnosis Date  . Arthritis   . GERD (gastroesophageal reflux disease)    occasional  . Gout   . Hypertension   . Kidney stone    Past Surgical History:  Procedure Laterality Date  . BACK SURGERY     1984 and 1996  . JOINT REPLACEMENT     left hip 09/2006  . TOTAL HIP ARTHROPLASTY  09/14/2011   Procedure: TOTAL HIP ARTHROPLASTY;  Surgeon: Loreta Ave, MD;  [facility];  Service: Orthopedics;  Laterality: Right;   Social History   Socioeconomic History  . Marital status: Married    Spouse name: Not on file  . Number of children: Not on file  . Years of education: Not on file  . Highest education level: Not on file  Occupational History  . Not on file  Tobacco Use  . Smoking status: Never Smoker  . Smokeless tobacco: Never Used  Substance and Sexual Activity  . Alcohol use: Yes    Comment: occasionally  . Drug use: Not on file  . Sexual activity: Not on file  Other Topics Concern  . Not on file  Social History Narrative  . Not on file   Social Determinants of Health  Financial Resource Strain:   . Difficulty of Paying Living Expenses: Not on file  Food Insecurity:   . Worried About Programme researcher, broadcasting/film/video in the Last Year: Not on file  . Ran Out of Food in the Last Year: Not on file  Transportation Needs:   . Lack of Transportation (Medical): Not on file  . Lack of Transportation (Non-Medical): Not on file  Physical Activity:   . Days of Exercise per Week: Not on file  . Minutes of Exercise per Session: Not on file  Stress:   . Feeling of Stress : Not on file  Social Connections:   . Frequency of Communication with  Friends and Family: Not on file  . Frequency of Social Gatherings with Friends and Family: Not on file  . Attends Religious Services: Not on file  . Active Member of Clubs or Organizations: Not on file  . Attends Banker Meetings: Not on file  . Marital Status: Not on file   No Known Allergies No family history on file.  Current Outpatient Medications (Endocrine & Metabolic):  .  predniSONE (DELTASONE) 50 MG tablet, Take 1 tablet (50 mg total) by mouth daily. .  predniSONE (DELTASONE) 50 MG tablet, 1 tablet by mouth daily  Current Outpatient Medications (Cardiovascular):  .  lisinopril (PRINIVIL,ZESTRIL) 40 MG tablet, Take 40 mg by mouth every morning.   .  simvastatin (ZOCOR) 20 MG tablet, Take 20 mg by mouth every morning.     Current Outpatient Medications (Analgesics):  .  allopurinol (ZYLOPRIM) 300 MG tablet, Take 300 mg by mouth every morning.   .  indomethacin (INDOCIN) 50 MG capsule, Take 1 capsule (50 mg total) by mouth 3 (three) times daily with meals. (Patient taking differently: Take 50 mg by mouth as needed. ) .  meloxicam (MOBIC) 15 MG tablet, Take 15 mg by mouth 2 (two) times daily.   Current Outpatient Medications (Other):  [name]  Diclofenac Sodium (PENNSAID) 2 % SOLN, Place 1 application onto the skin 2 (two) times daily. [name]  gabapentin (NEURONTIN) 300 MG capsule, TAKE 1 CAPSULES BY MOUTH 3 TIMES DAILY AS NEEDED .  PENNSAID 2 % SOLN, APPLY 1 PUMP (1 GRAM) TO AFFECTED AREA TOPICALLY TWICE DAILY AS DIRECTED .  venlafaxine XR (EFFEXOR XR) 37.5 MG 24 hr capsule, Take 1 capsule (37.5 mg total) by mouth daily with breakfast. .  Vitamin D, Ergocalciferol, (DRISDOL) 1.25 MG (50000 UT) CAPS capsule, TAKE 1 CAPSULE (50,000 UNITS TOTAL) BY MOUTH EVERY 7 (SEVEN) DAYS.   Reviewed prior external information including notes and imaging from  primary care provider As well as notes that were available from care everywhere and other healthcare systems.  Past medical  history, social, surgical and family history all reviewed in electronic medical record.  No pertanent information unless stated regarding to the chief complaint.   Review of Systems:  No headache, visual changes, nausea, vomiting, diarrhea, constipation, dizziness, abdominal pain, skin rash, fevers, chills, night sweats, weight loss, swollen lymph nodes, body aches, joint swelling, chest pain, shortness of breath, mood changes. POSITIVE muscle aches  Objective  Blood pressure 100/70, pulse 76, height 5\' 9"  (1.753 m), weight 182 lb (82.6 kg), SpO2 97 %.   General: No apparent distress alert and oriented x3 mood and affect normal, dressed appropriately.  HEENT: Pupils equal, extraocular movements intact  Respiratory: Patient's speak in full sentences and does not appear short of breath  Cardiovascular: No lower extremity edema, non tender, no erythema  Skin:  Warm dry intact with no signs of infection or rash on extremities or on axial skeleton.  Abdomen: Soft nontender   Lymph: No lymphadenopathy of posterior or anterior cervical chain or axillae bilaterally.  Gait guarded MSK:  Non tender with full range of motion and good stability and symmetric strength and tone of shoulders, elbows, wrist, hip, knee and ankles bilaterally.  Back Exam:  Inspection: Loss of lordosis Motion: Flexion 35 deg, Extension 25 deg, Side Bending to 45 deg bilaterally,  Rotation to 45 deg bilaterally  SLR laying: positive right 20 degrees  XSLR laying: Negative  Palpable tenderness: TTP more in the paraspinal musculature lumbar spine right greater than left.[name] FABER: positive right . Sensory change: Gross sensation intact to all lumbar and sacral dermatomes.  Reflexes: 2+ at both patellar tendons, decreased Achilles on the right Babinski's downgoing.  Strength at foot  4-5 strength of dorsiflexion.     Impression and Recommendations:     This case required medical decision making of moderate complexity. The  above documentation has been reviewed and is accurate and complete Lyndal Pulley, DO       Note: This dictation was prepared with Dragon dictation along with smaller phrase technology. Any transcriptional errors that result from this process are unintentional.

## 2019-11-05 ENCOUNTER — Encounter: Payer: Self-pay | Admitting: Family Medicine

## 2019-11-05 ENCOUNTER — Ambulatory Visit (INDEPENDENT_AMBULATORY_CARE_PROVIDER_SITE_OTHER): Payer: BC Managed Care – PPO | Admitting: Family Medicine

## 2019-11-05 ENCOUNTER — Other Ambulatory Visit: Payer: Self-pay

## 2019-11-05 VITALS — BP 100/70 | HR 76 | Ht 69.0 in | Wt 182.0 lb

## 2019-11-05 DIAGNOSIS — M5442 Lumbago with sciatica, left side: Secondary | ICD-10-CM | POA: Diagnosis not present

## 2019-11-05 DIAGNOSIS — G8929 Other chronic pain: Secondary | ICD-10-CM

## 2019-11-05 DIAGNOSIS — M5441 Lumbago with sciatica, right side: Secondary | ICD-10-CM | POA: Diagnosis not present

## 2019-11-05 DIAGNOSIS — M5440 Lumbago with sciatica, unspecified side: Secondary | ICD-10-CM

## 2019-11-05 MED ORDER — KETOROLAC TROMETHAMINE 60 MG/2ML IM SOLN
60.0000 mg | Freq: Once | INTRAMUSCULAR | Status: AC
  Administered 2019-11-05: 60 mg via INTRAMUSCULAR

## 2019-11-05 MED ORDER — METHYLPREDNISOLONE ACETATE 80 MG/ML IJ SUSP
80.0000 mg | Freq: Once | INTRAMUSCULAR | Status: AC
  Administered 2019-11-05: 80 mg via INTRAMUSCULAR

## 2019-11-05 NOTE — Addendum Note (Signed)
Addended by: Debbe Odea R on: 11/05/2019 03:00 PM   Modules accepted: Orders

## 2019-11-05 NOTE — Patient Instructions (Addendum)
Good to see you MRI Lumbar [phone] I will talk to you after we get results

## 2019-11-05 NOTE — Assessment & Plan Note (Signed)
Worsening symptoms at this time.  Significant positive straight leg test right greater than left today.  Seems to be at 20 degrees.  Decrease in deep tendon reflexes.  With patient's history I did feel that MRI is warranted at this point.  Has responded well to epidurals and nerve root injections previously.  Toradol and Depo-Medrol given today to help with some of the relief for extremity pain.  Continue the gabapentin.  Depending on findings on the MRI will discuss with medical management.

## 2019-11-06 ENCOUNTER — Encounter: Payer: Self-pay | Admitting: Family Medicine

## 2019-11-28 ENCOUNTER — Ambulatory Visit
Admission: RE | Admit: 2019-11-28 | Discharge: 2019-11-28 | Disposition: A | Discharge status: Home / Self Care | Payer: BC Managed Care – PPO | Source: Ambulatory Visit | Attending: Family Medicine | Admitting: Family Medicine

## 2019-11-28 ENCOUNTER — Other Ambulatory Visit: Payer: Self-pay

## 2019-11-28 DIAGNOSIS — M5440 Lumbago with sciatica, unspecified side: Secondary | ICD-10-CM

## 2019-11-28 DIAGNOSIS — G8929 Other chronic pain: Secondary | ICD-10-CM

## 2019-11-29 ENCOUNTER — Encounter: Payer: Self-pay | Admitting: Family Medicine

## 2019-12-02 ENCOUNTER — Other Ambulatory Visit: Payer: Self-pay

## 2019-12-02 DIAGNOSIS — G8929 Other chronic pain: Secondary | ICD-10-CM

## 2019-12-04 ENCOUNTER — Telehealth: Payer: Self-pay | Admitting: Family Medicine

## 2019-12-04 ENCOUNTER — Encounter: Payer: Self-pay | Admitting: Family Medicine

## 2019-12-04 ENCOUNTER — Other Ambulatory Visit: Payer: Self-pay

## 2019-12-04 MED ORDER — INDOMETHACIN 50 MG PO CAPS: 50.0000 mg | ORAL_CAPSULE | Freq: Three times a day (TID) | ORAL | 1 refills | Status: DC

## 2019-12-04 MED ORDER — GABAPENTIN 300 MG PO CAPS: ORAL_CAPSULE | ORAL | 2 refills | Status: DC

## 2019-12-04 NOTE — Telephone Encounter (Signed)
Spoke with pharmacist per a verbal from Dr. Katrinka Blazing he is ok with patient receiving rx for indomethacin.

## 2019-12-04 NOTE — Telephone Encounter (Signed)
Tammy from [facility] called, level 1 possible interaction between patient's existing lisinopril and the Indomethacin that Dr. Katrinka Blazing prescribed [facility] and renal ).  Phamacist wants to check before filling [phone]

## 2019-12-06 ENCOUNTER — Other Ambulatory Visit: Payer: Self-pay

## 2019-12-06 ENCOUNTER — Ambulatory Visit
Admission: RE | Admit: 2019-12-06 | Discharge: 2019-12-06 | Disposition: A | Discharge status: Home / Self Care | Payer: BC Managed Care – PPO | Source: Ambulatory Visit | Attending: Family Medicine | Admitting: Family Medicine

## 2019-12-06 DIAGNOSIS — G8929 Other chronic pain: Secondary | ICD-10-CM

## 2019-12-06 DIAGNOSIS — M545 Low back pain, unspecified: Secondary | ICD-10-CM

## 2019-12-06 MED ORDER — METHYLPREDNISOLONE ACETATE 40 MG/ML INJ SUSP (RADIOLOG: 120.0000 mg | Freq: Once | INTRAMUSCULAR | Status: DC

## 2019-12-06 MED ORDER — IOPAMIDOL (ISOVUE-M 200) INJECTION 41%: 1.0000 mL | Freq: Once | INTRAMUSCULAR | Status: DC

## 2019-12-06 NOTE — Discharge Instructions (Signed)

## 2020-03-31 ENCOUNTER — Encounter: Payer: Self-pay | Admitting: Family Medicine

## 2020-03-31 ENCOUNTER — Other Ambulatory Visit: Payer: Self-pay

## 2020-03-31 ENCOUNTER — Ambulatory Visit: Payer: BC Managed Care – PPO | Admitting: Family Medicine

## 2020-03-31 VITALS — BP 136/90 | HR 71 | Ht 69.0 in | Wt 185.0 lb

## 2020-03-31 DIAGNOSIS — G8929 Other chronic pain: Secondary | ICD-10-CM

## 2020-03-31 DIAGNOSIS — M545 Low back pain, unspecified: Secondary | ICD-10-CM

## 2020-03-31 DIAGNOSIS — M25511 Pain in right shoulder: Secondary | ICD-10-CM | POA: Diagnosis not present

## 2020-03-31 DIAGNOSIS — M109 Gout, unspecified: Secondary | ICD-10-CM | POA: Diagnosis not present

## 2020-03-31 MED ORDER — INDOMETHACIN 50 MG PO CAPS: 50.0000 mg | ORAL_CAPSULE | Freq: Three times a day (TID) | ORAL | 1 refills | Status: DC

## 2020-03-31 NOTE — Assessment & Plan Note (Signed)
Continue to have significant amount of pain. Has had injection here previously but I do feel an MRI is necessary for the shoulder with how long its been. We will see if there is any underlying rotator cuff pathology that could be contributing. See how patient responds to the injection. Follow-up again in 4 to 8 weeks

## 2020-03-31 NOTE — Progress Notes (Signed)
[name] [address] [place] [address] Phone: [phone] Subjective:   I [name] am serving as a Neurosurgeon for Dr. Antoine Primas.  This visit occurred during the SARS-CoV-2 public health emergency.  Safety protocols were in place, including screening questions prior to the visit, additional usage of staff PPE, and extensive cleaning of exam room while observing appropriate contact time as indicated for disinfecting solutions.   I'm seeing this patient by the request  of:  [name], MD  CC: Back pain, shoulder pain follow-up  [id number]   11/05/2019 Worsening symptoms at this time.  Significant positive straight leg test right greater than left today.  Seems to be at 20 degrees.  Decrease in deep tendon reflexes.  With patient's history I did feel that MRI is warranted at this point.  Has responded well to epidurals and nerve root injections previously.  Toradol and Depo-Medrol given today to help with some of the relief for extremity pain.  Continue the gabapentin.  Depending on findings on the MRI will discuss with medical management.  03/31/2020 Kyle Lawson is a 64 y.o. male coming in with complaint of right shoulder and back pain. Shoulder is not doing well. States the shoulder is not doing better. Would like injection today. Back is still painful. Heating and icing both the back and shoulder while working from home. Epidural did give him some relief and he would like another.  Patient shoulder has been going on a year and a half and feels like he is not making significant progress. Started with more of a [place] pain and swelling that has resolved but continues to have more left shoulder pain. Affecting daily activities. 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  Back exam did respond well to the epidural the pain is starting to come back again. States  that it is very painless for some time there. Starting to affect daily activities again.      Past Medical History:  Diagnosis Date  . Arthritis   . GERD (gastroesophageal reflux disease)    occasional  . Gout   . Hypertension   . Kidney stone    Past Surgical History:  Procedure Laterality Date  . BACK SURGERY     1984 and 1996  . JOINT REPLACEMENT     left hip 09/2006  . TOTAL HIP ARTHROPLASTY  09/14/2011   Procedure: TOTAL HIP ARTHROPLASTY;  Surgeon: Loreta Ave, MD;  Location: [facility] OR;  Service: Orthopedics;  Laterality: Right;   Social History   Socioeconomic History  . Marital status: Married    Spouse name: Not on file  . Number of children: Not on file  . Years of education: Not on file  . Highest education level: Not on file  Occupational History  . Not on file  Tobacco Use  . Smoking status: Never Smoker  . Smokeless tobacco: Never Used  Substance and Sexual Activity  . Alcohol use: Yes    Comment: occasionally  . Drug use: Not on file  . Sexual activity: Not on file  Other Topics Concern  . Not on file  Social History Narrative  . Not on file   Social Determinants of Health   Financial Resource Strain:   . Difficulty of Paying Living Expenses:   Food Insecurity:   . Worried About Programme researcher, broadcasting/film/video in the Last  Year:   . Ran Out of Food in the Last Year:   Transportation Needs:   . Freight forwarder (Medical):   [name] Lack of Transportation (Non-Medical):   Physical Activity:   . Days of Exercise per Week:   . Minutes of Exercise per Session:   Stress:   . Feeling of Stress :   Social Connections:   . Frequency of Communication with Friends and Family:   . Frequency of Social Gatherings with Friends and Family:   . Attends Religious Services:   . Active Member of Clubs or Organizations:   . Attends Banker Meetings:   [name] Marital Status:    No Known Allergies No family history on file.  Current Outpatient Medications  (Endocrine & Metabolic):  .  predniSONE (DELTASONE) 50 MG tablet, Take 1 tablet (50 mg total) by mouth daily. .  predniSONE (DELTASONE) 50 MG tablet, 1 tablet by mouth daily  Current Outpatient Medications (Cardiovascular):  .  lisinopril (PRINIVIL,ZESTRIL) 40 MG tablet, Take 40 mg by mouth every morning.   .  simvastatin (ZOCOR) 20 MG tablet, Take 20 mg by mouth every morning.     Current Outpatient Medications (Analgesics):  .  allopurinol (ZYLOPRIM) 300 MG tablet, Take 300 mg by mouth every morning.   .  meloxicam (MOBIC) 15 MG tablet, Take 15 mg by mouth 2 (two) times daily. .  indomethacin (INDOCIN) 50 MG capsule, Take 1 capsule (50 mg total) by mouth 3 (three) times daily with meals.   Current Outpatient Medications (Other):  [name]  Diclofenac Sodium (PENNSAID) 2 % SOLN, Place 1 application onto the skin 2 (two) times daily. [name]  gabapentin (NEURONTIN) 300 MG capsule, TAKE 1 CAPSULES BY MOUTH 3 TIMES DAILY AS NEEDED .  PENNSAID 2 % SOLN, APPLY 1 PUMP (1 GRAM) TO AFFECTED AREA TOPICALLY TWICE DAILY AS DIRECTED .  venlafaxine XR (EFFEXOR XR) 37.5 MG 24 hr capsule, Take 1 capsule (37.5 mg total) by mouth daily with breakfast. .  Vitamin D, Ergocalciferol, (DRISDOL) 1.25 MG (50000 UT) CAPS capsule, TAKE 1 CAPSULE (50,000 UNITS TOTAL) BY MOUTH EVERY 7 (SEVEN) DAYS.   Reviewed prior external information including notes and imaging from  primary care provider As well as notes that were available from care everywhere and other healthcare systems.  Past medical history, social, surgical and family history all reviewed in electronic medical record.  No pertanent information unless stated regarding to the chief complaint.   Review of Systems:  No headache, visual changes, nausea, vomiting, diarrhea, constipation, dizziness, abdominal pain, skin rash, fevers, chills, night sweats, weight loss, swollen lymph nodes, body aches, joint swelling, chest pain, shortness of breath, mood changes. POSITIVE  muscle aches  Objective  Blood pressure (!) 136/90, pulse 71, height 5\' 9"  (1.753 m), weight 185 lb (83.9 kg), SpO2 98 %.   General: No apparent distress alert and oriented x3 mood and affect normal, dressed appropriately.  HEENT: Pupils equal, extraocular movements intact  Respiratory: Patient's speak in full sentences and does not appear short of breath  Cardiovascular: No lower extremity edema, non tender, no erythema  Neuro: Cranial nerves II through XII are intact, neurovascularly intact in all extremities with 2+ DTRs and 2+ pulses.  Gait mild antalgic  Right shoulder exam positive impingement. Rotator cuff 4 out of 5 strength compared to the contralateral side. Positive crossover and mild O'Brien's.  Back exam loss of lordosis. Mild radicular symptoms even with straight leg at 45 degrees of flexion. Patient does  have tightness of the hamstring bilaterally. Patient does have some increasing discomfort with greater than 5 degrees of extension.  After informed written and verbal consent, patient was seated on exam table. Right shoulder was prepped with alcohol swab and utilizing posterior approach, patient's right glenohumeral space was injected with 4:1  marcaine 0.5%: Kenalog 40mg /dL. Patient tolerated the procedure well without immediate complications.    Impression and Recommendations:     The above documentation has been reviewed and is accurate and complete , DO       Note: This dictation was prepared with Dragon dictation along with smaller phrase technology. Any transcriptional errors that result from this process are unintentional.

## 2020-03-31 NOTE — Assessment & Plan Note (Signed)
Injection given failed all conservative therapy and if no improvement with injection MRI will be necessary ordered today and patient will follow through if continuing have pain after 2 weeks of the injection meloxicam for breakthrough

## 2020-03-31 NOTE — Assessment & Plan Note (Signed)
Likely contributing and will continue to monitor continue medications refilled indomethacin with this being a chronic problem

## 2020-03-31 NOTE — Patient Instructions (Addendum)
MRI shoulder call [facility] to schedule Epidural ordered call [facility] to schedule [phone] Medication refilled We will be in contact after MRI

## 2020-04-07 ENCOUNTER — Ambulatory Visit
Admission: RE | Admit: 2020-04-07 | Discharge: 2020-04-07 | Disposition: A | Discharge status: Home / Self Care | Payer: BC Managed Care – PPO | Source: Ambulatory Visit | Attending: Family Medicine | Admitting: Family Medicine

## 2020-04-07 DIAGNOSIS — M545 Low back pain, unspecified: Secondary | ICD-10-CM

## 2020-04-07 DIAGNOSIS — G8929 Other chronic pain: Secondary | ICD-10-CM

## 2020-04-07 MED ORDER — IOPAMIDOL (ISOVUE-M 200) INJECTION 41%
1.0000 mL | Freq: Once | INTRAMUSCULAR | Status: AC
  Administered 2020-04-07: 1 mL via EPIDURAL

## 2020-04-07 MED ORDER — METHYLPREDNISOLONE ACETATE 40 MG/ML INJ SUSP (RADIOLOG
120.0000 mg | Freq: Once | INTRAMUSCULAR | Status: AC
  Administered 2020-04-07: 120 mg via EPIDURAL

## 2020-04-07 NOTE — Discharge Instructions (Signed)

## 2020-04-11 IMAGING — DX DG SHOULDER 2+V*R*
3 series · 3 of 3 positions shown · non-contrast
Comparison: None.

CLINICAL DATA: Right shoulder after trip and fall injury.

EXAM:
RIGHT SHOULDER - 2+ VIEW

[shoulder grashey ap]
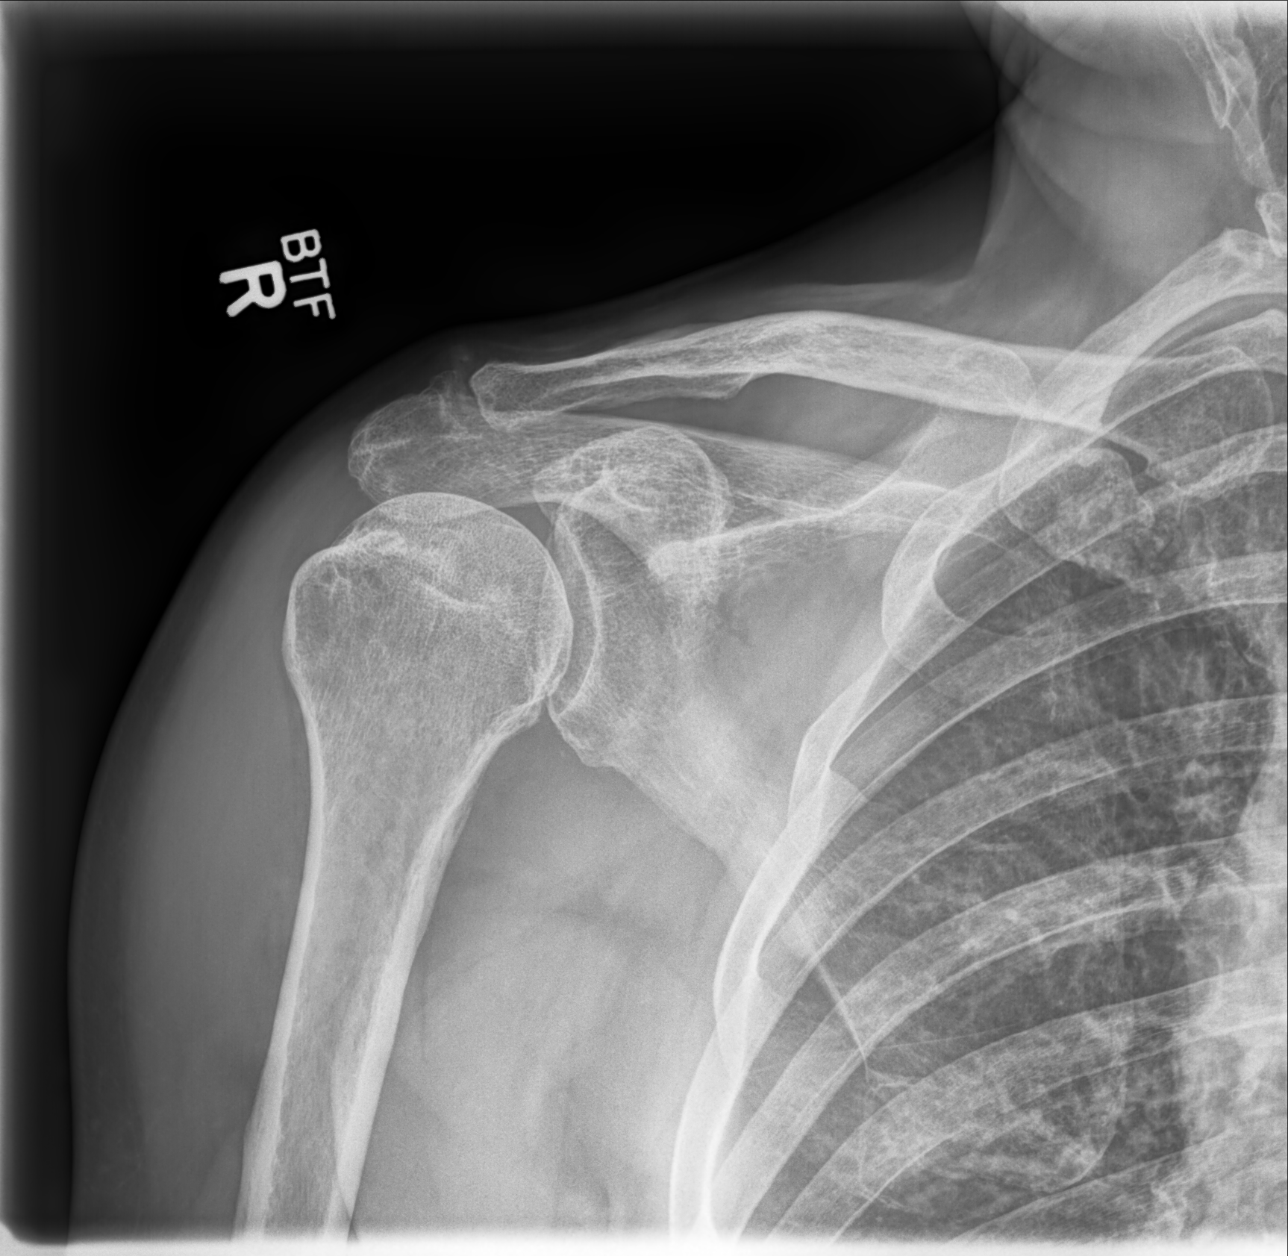

[shoulder y view]
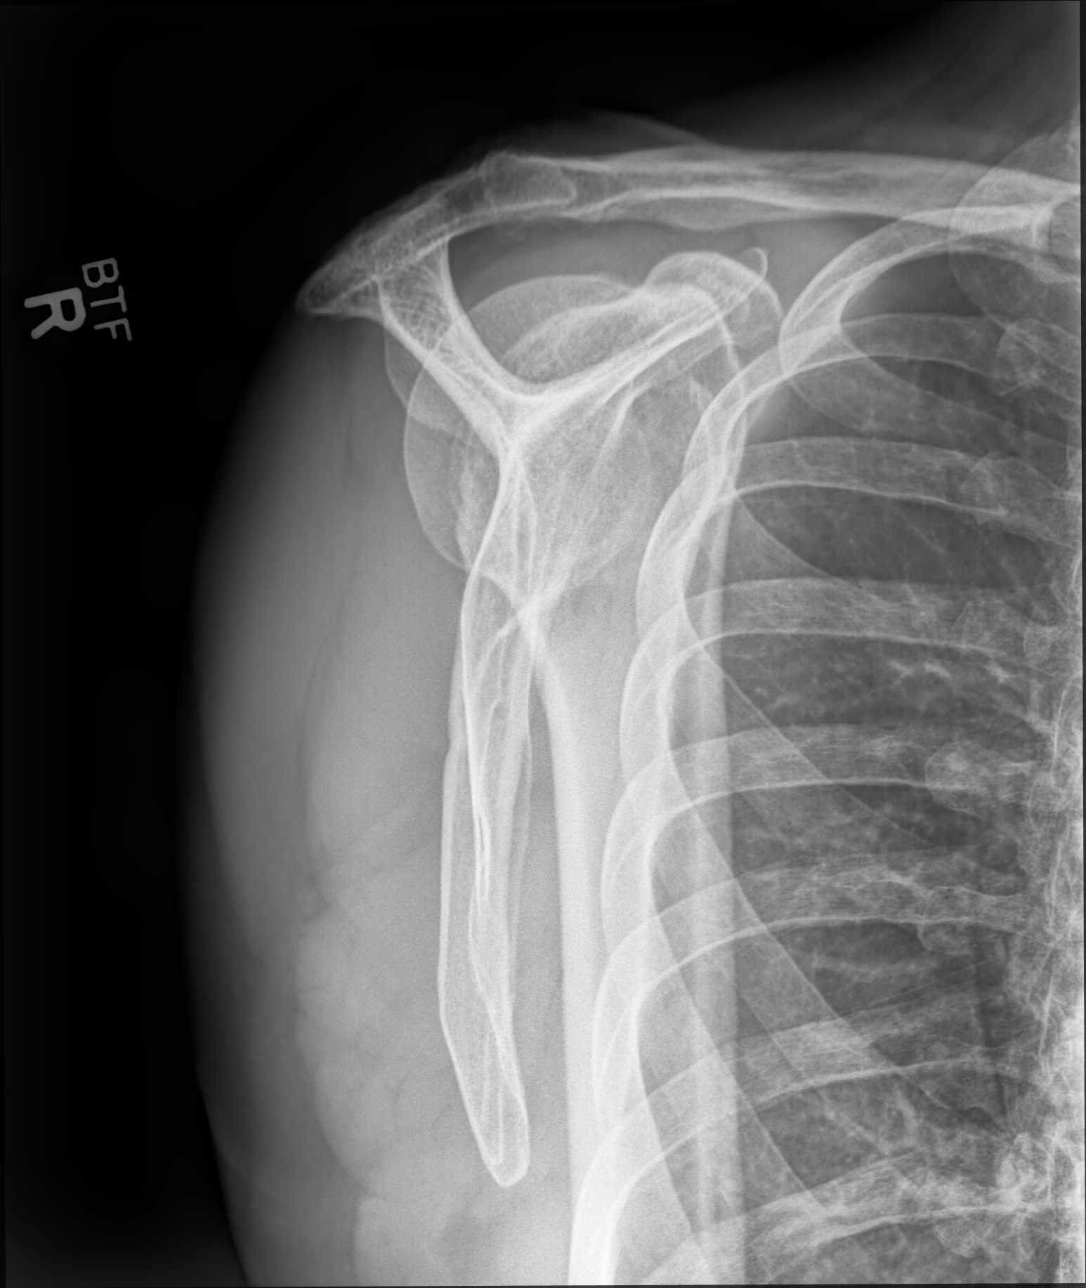

[shoulder axial]
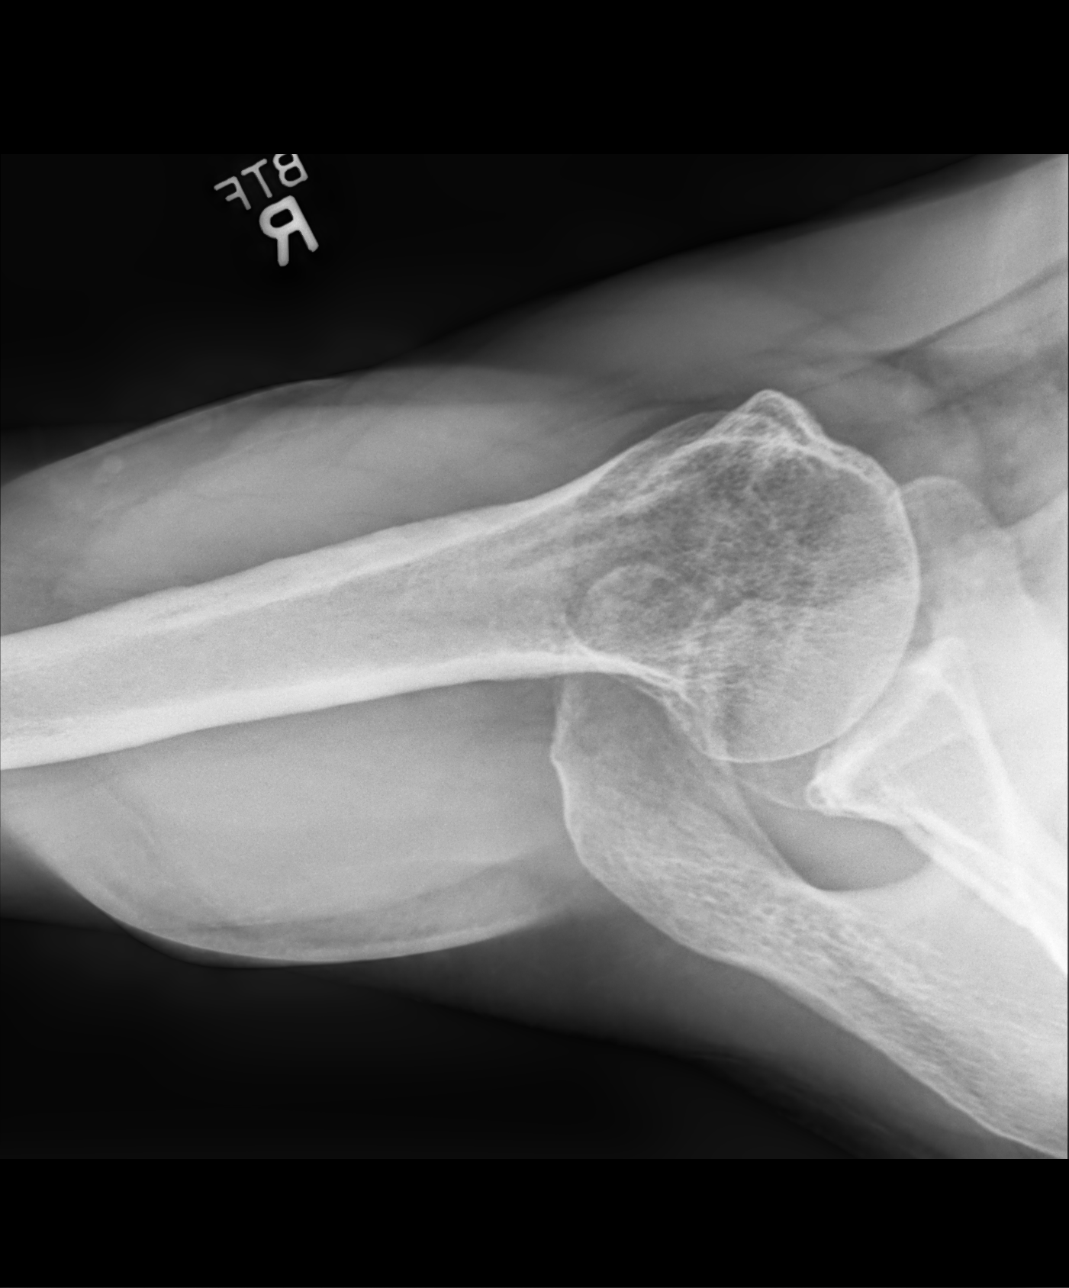

[3 of 3 positions shown; findings below may reference images not displayed]

FINDINGS: No fracture or dislocation.

Mild degenerative change the right glenohumeral joint with joint
space loss, subchondral sclerosis osteophytosis. Moderate
degenerative change the right AC joint with both superior and
inferiorly directed osteophytosis. No evidence of calcific
tendinitis.

Limited visualization of the adjacent thorax is normal. Regional
soft tissues appear normal.
IMPRESSION: 1. No acute findings.
2. Mild-to-moderate degenerative change of the right
acromioclavicular and to a lesser extent, glenohumeral joints.

## 2020-04-23 ENCOUNTER — Ambulatory Visit
Admission: RE | Admit: 2020-04-23 | Discharge: 2020-04-23 | Disposition: A | Discharge status: Home / Self Care | Payer: BC Managed Care – PPO | Source: Ambulatory Visit | Attending: Family Medicine | Admitting: Family Medicine

## 2020-04-23 DIAGNOSIS — G8929 Other chronic pain: Secondary | ICD-10-CM

## 2020-04-25 ENCOUNTER — Encounter: Payer: Self-pay | Admitting: Family Medicine

## 2020-04-27 ENCOUNTER — Other Ambulatory Visit: Payer: Self-pay

## 2020-04-27 DIAGNOSIS — M25511 Pain in right shoulder: Secondary | ICD-10-CM

## 2020-05-27 ENCOUNTER — Encounter: Payer: Self-pay | Admitting: Family Medicine

## 2020-06-10 ENCOUNTER — Encounter: Payer: Self-pay | Admitting: Family Medicine

## 2020-06-10 ENCOUNTER — Other Ambulatory Visit: Payer: Self-pay

## 2020-06-10 DIAGNOSIS — M47816 Spondylosis without myelopathy or radiculopathy, lumbar region: Secondary | ICD-10-CM

## 2020-06-22 ENCOUNTER — Ambulatory Visit
Admission: RE | Admit: 2020-06-22 | Discharge: 2020-06-22 | Disposition: A | Discharge status: Home / Self Care | Payer: BC Managed Care – PPO | Source: Ambulatory Visit | Attending: Family Medicine | Admitting: Family Medicine

## 2020-06-22 ENCOUNTER — Other Ambulatory Visit: Payer: Self-pay

## 2020-06-22 DIAGNOSIS — M47816 Spondylosis without myelopathy or radiculopathy, lumbar region: Secondary | ICD-10-CM

## 2020-06-22 MED ORDER — METHYLPREDNISOLONE ACETATE 40 MG/ML INJ SUSP (RADIOLOG
120.0000 mg | Freq: Once | INTRAMUSCULAR | Status: AC
  Administered 2020-06-22: 120 mg via EPIDURAL

## 2020-06-22 MED ORDER — IOPAMIDOL (ISOVUE-M 200) INJECTION 41%
1.0000 mL | Freq: Once | INTRAMUSCULAR | Status: AC
  Administered 2020-06-22: 1 mL via EPIDURAL

## 2020-06-22 NOTE — Discharge Instructions (Signed)

## 2020-07-20 ENCOUNTER — Ambulatory Visit: Payer: BC Managed Care – PPO | Admitting: Family Medicine

## 2020-09-05 DIAGNOSIS — I712 Thoracic aortic aneurysm, without rupture, unspecified: Secondary | ICD-10-CM

## 2020-09-05 HISTORY — DX: Thoracic aortic aneurysm, without rupture, unspecified: I71.20

## 2020-09-14 ENCOUNTER — Other Ambulatory Visit: Payer: Self-pay

## 2020-09-14 ENCOUNTER — Encounter: Payer: Self-pay | Admitting: Family Medicine

## 2020-09-14 DIAGNOSIS — M47816 Spondylosis without myelopathy or radiculopathy, lumbar region: Secondary | ICD-10-CM

## 2020-09-24 ENCOUNTER — Other Ambulatory Visit: Payer: Self-pay

## 2020-09-24 ENCOUNTER — Ambulatory Visit
Admission: RE | Admit: 2020-09-24 | Discharge: 2020-09-24 | Disposition: A | Discharge status: Home / Self Care | Payer: 59 | Source: Ambulatory Visit | Attending: Family Medicine | Admitting: Family Medicine

## 2020-09-24 DIAGNOSIS — M47816 Spondylosis without myelopathy or radiculopathy, lumbar region: Secondary | ICD-10-CM

## 2020-09-24 MED ORDER — IOPAMIDOL (ISOVUE-M 200) INJECTION 41%
1.0000 mL | Freq: Once | INTRAMUSCULAR | Status: AC
  Administered 2020-09-24: 1 mL via EPIDURAL

## 2020-09-24 MED ORDER — METHYLPREDNISOLONE ACETATE 40 MG/ML INJ SUSP (RADIOLOG
120.0000 mg | Freq: Once | INTRAMUSCULAR | Status: AC
  Administered 2020-09-24: 120 mg via EPIDURAL

## 2020-09-24 NOTE — Discharge Instructions (Signed)

## 2020-10-21 NOTE — Progress Notes (Unsigned)
[name] [address] [place] [address] Phone: [phone] Subjective:   Kyle Lawson, am serving as a scribe for Dr. Antoine Primas. This visit occurred during the SARS-CoV-2 public health emergency.  Safety protocols were in place, including screening questions prior to the visit, additional usage of staff PPE, and extensive cleaning of exam room while observing appropriate contact time as indicated for disinfecting solutions.   I'm seeing this patient by the request  of:  [name], MD  CC:   [id number]   03/31/2020 Injection given failed all conservative therapy and if no improvement with injection MRI will be necessary ordered today and patient will follow through if continuing have pain after 2 weeks of the injection meloxicam for breakthrough  Likely contributing and will continue to monitor continue medications refilled indomethacin with this being a chronic problem  Continue to have significant amount of pain. Has had injection here previously but I do feel an MRI is necessary for the shoulder with how long its been. We will see if there is any underlying rotator cuff pathology that could be contributing. See how patient responds to the injection. Follow-up again in 4 to 8 weeks  Update 10/22/2020 Kyle Lawson is a 65 y.o. male coming in with complaint of right shoulder pain and low back pain. Patient states that he continues to have referred pain into left glute and into the left leg and foot. Pain also radiates into right quad and foot. Had not relief from epidural injection. Is using indomethacin, Tylenol, gabapentin before bed.   Shoulder is doing great.    Patient does have a left-sided L5 nerve root impingement noted on MRI in March 2021 patient has undergone 4 different injections at this time with the last 1 being January 20th-20 22  Past Medical History:  Diagnosis Date  . Arthritis   . GERD (gastroesophageal reflux  disease)    occasional  . Gout   . Hypertension   . Kidney stone    Past Surgical History:  Procedure Laterality Date  . BACK SURGERY     1984 and 1996  . JOINT REPLACEMENT     left hip 09/2006  . TOTAL HIP ARTHROPLASTY  09/14/2011   Procedure: TOTAL HIP ARTHROPLASTY;  Surgeon: Loreta Ave, MD;  Location: [facility] OR;  Service: Orthopedics;  Laterality: Right;   Social History   Socioeconomic History  . Marital status: Married    Spouse name: Not on file  . Number of children: Not on file  . Years of education: Not on file  . Highest education level: Not on file  Occupational History  . Not on file  Tobacco Use  . Smoking status: Never Smoker  . Smokeless tobacco: Never Used  Substance and Sexual Activity  . Alcohol use: Yes    Comment: occasionally  . Drug use: Not on file  . Sexual activity: Not on file  Other Topics Concern  . Not on file  Social History Narrative  . Not on file   Social Determinants of Health   Financial Resource Strain: Not on file  Food Insecurity: Not on file  Transportation Needs: Not on file  Physical Activity: Not on file  Stress: Not on file  Social Connections: Not on file   No Known Allergies No family history on file.  Current Outpatient Medications (Endocrine & Metabolic):  .  predniSONE (DELTASONE) 20 MG tablet, Take 1 tablet (20 mg total) by mouth 2 (two)  times daily. .  predniSONE (DELTASONE) 50 MG tablet, Take 1 tablet (50 mg total) by mouth daily. .  predniSONE (DELTASONE) 50 MG tablet, 1 tablet by mouth daily  Current Outpatient Medications (Cardiovascular):  .  lisinopril (PRINIVIL,ZESTRIL) 40 MG tablet, Take 40 mg by mouth every morning. .  simvastatin (ZOCOR) 20 MG tablet, Take 20 mg by mouth every morning.   Current Outpatient Medications (Analgesics):  .  allopurinol (ZYLOPRIM) 300 MG tablet, Take 300 mg by mouth every morning. .  indomethacin (INDOCIN) 50 MG capsule, Take 1 capsule (50 mg total) by mouth 3 (three)  times daily with meals. .  meloxicam (MOBIC) 15 MG tablet, Take 15 mg by mouth 2 (two) times daily. .  naproxen (NAPROSYN) 500 MG tablet, Take 1 tablet (500 mg total) by mouth 2 (two) times daily as needed.   Current Outpatient Medications (Other):  [name]  Diclofenac Sodium (PENNSAID) 2 % SOLN, Place 1 application onto the skin 2 (two) times daily. [name]  gabapentin (NEURONTIN) 300 MG capsule, TAKE 1 CAPSULES BY MOUTH 3 TIMES DAILY AS NEEDED .  PENNSAID 2 % SOLN, APPLY 1 PUMP (1 GRAM) TO AFFECTED AREA TOPICALLY TWICE DAILY AS DIRECTED .  venlafaxine XR (EFFEXOR XR) 37.5 MG 24 hr capsule, Take 1 capsule (37.5 mg total) by mouth daily with breakfast. .  Vitamin D, Ergocalciferol, (DRISDOL) 1.25 MG (50000 UT) CAPS capsule, TAKE 1 CAPSULE (50,000 UNITS TOTAL) BY MOUTH EVERY 7 (SEVEN) DAYS.   Reviewed prior external information including notes and imaging from  primary care provider As well as notes that were available from care everywhere and other healthcare systems.  Past medical history, social, surgical and family history all reviewed in electronic medical record.  No pertanent information unless stated regarding to the chief complaint.   Review of Systems:  No headache, visual changes, nausea, vomiting, diarrhea, constipation, dizziness, abdominal pain, skin rash, fevers, chills, night sweats, weight loss, swollen lymph nodes, body aches, joint swelling, chest pain, shortness of breath, mood changes. POSITIVE muscle aches  Objective  Blood pressure (!) 142/92, pulse 87, height 5\' 9"  (1.753 m), weight 187 lb (84.8 kg), SpO2 97 %.   General: No apparent distress alert and oriented x3 mood and affect normal, dressed appropriately.  HEENT: Pupils equal, extraocular movements intact  Respiratory: Patient's speak in full sentences and does not appear short of breath  Cardiovascular: No lower extremity edema, non tender, no erythema  Gait normal with good balance and coordination.  MSK: Patient  appears very uncomfortable.  Does not want to sit down secondary to discomfort.  Patient though does have good strength.  Neurovascularly    Impression and Recommendations:     The above documentation has been reviewed and is accurate and complete , DO

## 2020-10-22 ENCOUNTER — Ambulatory Visit: Payer: 59 | Admitting: Family Medicine

## 2020-10-22 ENCOUNTER — Encounter: Payer: Self-pay | Admitting: Family Medicine

## 2020-10-22 ENCOUNTER — Other Ambulatory Visit: Payer: Self-pay

## 2020-10-22 VITALS — BP 142/92 | HR 87 | Ht 69.0 in | Wt 187.0 lb

## 2020-10-22 DIAGNOSIS — M47816 Spondylosis without myelopathy or radiculopathy, lumbar region: Secondary | ICD-10-CM

## 2020-10-22 MED ORDER — NAPROXEN 500 MG PO TABS: 500.0000 mg | ORAL_TABLET | Freq: Two times a day (BID) | ORAL | 0 refills | Status: DC | PRN

## 2020-10-22 MED ORDER — KETOROLAC TROMETHAMINE 60 MG/2ML IM SOLN
60.0000 mg | Freq: Once | INTRAMUSCULAR | Status: AC
  Administered 2020-10-22: 60 mg via INTRAMUSCULAR

## 2020-10-22 MED ORDER — METHYLPREDNISOLONE ACETATE 80 MG/ML IJ SUSP
80.0000 mg | Freq: Once | INTRAMUSCULAR | Status: AC
  Administered 2020-10-22: 80 mg via INTRAMUSCULAR

## 2020-10-22 MED ORDER — PREDNISONE 20 MG PO TABS: 20.0000 mg | ORAL_TABLET | Freq: Two times a day (BID) | ORAL | 0 refills | Status: DC

## 2020-10-22 NOTE — Patient Instructions (Addendum)
Referral to Kyle Lawson and [place]-They will call you Prednisone 40mg  daily for next 5 days Injections in backside today Naproxen 500mg  2x daily for 10 days, then go to once daily stop if it hurts your stomach I'm here if you have questions otherwise Continue gabapentin at night

## 2020-10-22 NOTE — Assessment & Plan Note (Signed)
Patient does have left-sided L5 nerve root impingement.  Patient has had 2 back surgeries one in 84 and 96.  Both of these were laminectomies.  Patient's MRIs do not do show continued nerve impingement.  Patient is no longer responding well to the epidurals.  Increase activity slowly.  Patient will be referred to surgery to discuss surgical options at this time.  Patient has worsening pain to seek medical attention.  5-day burst of prednisone to help with pain at this time and then will try naproxen.  Encouraged him to stay away from the indomethacin long-term.  Patient has been taking it for gout and hyperlipidemia regularly.  Did not get response with meloxicam.  Follow-up with pulmonary as needed.  Total time with patient 45 minutes.  Toradol and Depo-Medrol today the pain relief today.  We will start the prednisone tomorrow.

## 2020-12-09 ENCOUNTER — Other Ambulatory Visit: Payer: Self-pay | Admitting: Family Medicine

## 2020-12-09 NOTE — Progress Notes (Signed)
ASCVD 12.5. No sx of ACS.

## 2020-12-14 ENCOUNTER — Other Ambulatory Visit: Payer: Self-pay | Admitting: Neurosurgery

## 2020-12-15 ENCOUNTER — Encounter: Payer: Self-pay | Admitting: Family Medicine

## 2020-12-21 ENCOUNTER — Ambulatory Visit (HOSPITAL_COMMUNITY)
Admission: RE | Admit: 2020-12-21 | Discharge: 2020-12-21 | Disposition: A | Discharge status: Home / Self Care | Payer: 59 | Source: Ambulatory Visit | Attending: Family Medicine | Admitting: Family Medicine

## 2020-12-21 ENCOUNTER — Other Ambulatory Visit: Payer: Self-pay

## 2020-12-21 DIAGNOSIS — Z136 Encounter for screening for cardiovascular disorders: Secondary | ICD-10-CM | POA: Insufficient documentation

## 2021-01-05 NOTE — Pre-Procedure Instructions (Signed)
Surgical Instructions    Your procedure is scheduled on Monday, May 9th.  Report to [facility] Main Entrance "A" at 7:15 A.M., then check in with the Admitting office.  Call this number if you have problems the morning of surgery:  [phone]   If you have any questions prior to your surgery date call [phone]: Open Monday-Friday 8am-4pm    Remember:  Do not eat or drink after midnight the night before your surgery    Take these medicines the morning of surgery with A SIP OF WATER  allopurinol (ZYLOPRIM)  simvastatin (ZOCOR)  acetaminophen (TYLENOL)-as needed  As of today, STOP taking any Aspirin (unless otherwise instructed by your surgeon) Aleve, Naproxen, Ibuprofen, Motrin, Advil, Goody's, BC's, all herbal medications, fish oil, and all vitamins.                     Do NOT Smoke (Tobacco/Vaping) or drink Alcohol 24 hours prior to your procedure.  If you use a CPAP at night, you may bring all equipment for your overnight stay.   Contacts, glasses, piercing's, hearing aid's, dentures or partials may not be worn into surgery, please bring cases for these belongings.    For patients admitted to the hospital, discharge time will be determined by your treatment team.   Patients discharged the day of surgery will not be allowed to drive home, and someone needs to stay with them for 24 hours.    Special instructions:   [place]- Preparing For Surgery  Before surgery, you can play an important role. Because skin is not sterile, your skin needs to be as free of germs as possible. You can reduce the number of germs on your skin by washing with CHG (chlorahexidine gluconate) Soap before surgery.  CHG is an antiseptic cleaner which kills germs and bonds with the skin to continue killing germs even after washing.    Oral Hygiene is also important to reduce your risk of infection.  Remember - BRUSH YOUR TEETH THE MORNING OF SURGERY WITH YOUR REGULAR TOOTHPASTE  Please do not use  if you have an allergy to CHG or antibacterial soaps. If your skin becomes reddened/irritated stop using the CHG.  Do not shave (including legs and underarms) for at least 48 hours prior to first CHG shower. It is OK to shave your face.  Please follow these instructions carefully.   1. Shower the NIGHT BEFORE SURGERY and the MORNING OF SURGERY  2. If you chose to wash your hair, wash your hair first as usual with your normal shampoo.  3. After you shampoo, rinse your hair and body thoroughly to remove the shampoo.  4. Use CHG Soap as you would any other liquid soap. You can apply CHG directly to the skin and wash gently with a scrungie or a clean washcloth.   5. Apply the CHG Soap to your body ONLY FROM THE NECK DOWN.  Do not use on open wounds or open sores. Avoid contact with your eyes, ears, mouth and genitals (private parts). Wash Face and genitals (private parts)  with your normal soap.   6. Wash thoroughly, paying special attention to the area where your surgery will be performed.  7. Thoroughly rinse your body with warm water from the neck down.  8. DO NOT shower/wash with your normal soap after using and rinsing off the CHG Soap.  9. Pat yourself dry with a CLEAN TOWEL.  10. Wear CLEAN PAJAMAS to bed the night before surgery  11. Place CLEAN SHEETS on your bed the night before your surgery  12. DO NOT SLEEP WITH PETS.   Day of Surgery: Shower with CHG soap. Do not wear jewelry. Do not wear lotions, powders, colognes, or deodorant. Men may shave face and neck. Do not bring valuables to the hospital. [facility] is not responsible for any belongings or valuables. Wear Clean/Comfortable clothing the morning of surgery Remember to brush your teeth WITH YOUR REGULAR TOOTHPASTE.   Please read over the following fact sheets that you were given.

## 2021-01-06 ENCOUNTER — Other Ambulatory Visit: Payer: Self-pay

## 2021-01-06 ENCOUNTER — Encounter (HOSPITAL_COMMUNITY)
Admission: RE | Admit: 2021-01-06 | Discharge: 2021-01-06 | Disposition: A | Discharge status: Home / Self Care | Payer: 59 | Source: Ambulatory Visit | Attending: Neurosurgery | Admitting: Neurosurgery

## 2021-01-06 ENCOUNTER — Encounter (HOSPITAL_COMMUNITY): Payer: Self-pay

## 2021-01-06 DIAGNOSIS — Z01818 Encounter for other preprocedural examination: Secondary | ICD-10-CM | POA: Diagnosis present

## 2021-01-06 HISTORY — DX: Personal history of urinary calculi: Z87.442

## 2021-01-06 LAB — BASIC METABOLIC PANEL
Anion gap: 6 (ref 5–15)
BUN: 14 mg/dL (ref 8–23)
CO2: 25 mmol/L (ref 22–32)
Calcium: 8.8 mg/dL — ABNORMAL LOW (ref 8.9–10.3)
Chloride: 106 mmol/L (ref 98–111)
Creatinine, Ser: 0.78 mg/dL (ref 0.61–1.24)
GFR, Estimated: >60 mL/min (ref >60)
Glucose, Bld: 117 mg/dL — ABNORMAL HIGH (ref 70–99)
Potassium: 3.7 mmol/L (ref 3.5–5.1)
Sodium: 137 mmol/L (ref 135–145)

## 2021-01-06 LAB — CBC
HCT: 40.2 % (ref 39.0–52.0)
Hemoglobin: 13.8 g/dL (ref 13.0–17.0)
MCH: 33.9 pg (ref 26.0–34.0)
MCHC: 34.3 g/dL (ref 30.0–36.0)
MCV: 98.8 fL (ref 80.0–100.0)
Platelets: 251 10*3/uL (ref 150–400)
RBC: 4.07 MIL/uL — ABNORMAL LOW (ref 4.22–5.81)
RDW: 12 % (ref 11.5–15.5)
WBC: 8.6 10*3/uL (ref 4.0–10.5)
nRBC: 0 % (ref 0.0–0.2)

## 2021-01-06 LAB — SURGICAL PCR SCREEN
MRSA, PCR: NEGATIVE
Staphylococcus aureus: NEGATIVE

## 2021-01-06 LAB — TYPE AND SCREEN
ABO/RH(D): B POS
Antibody Screen: NEGATIVE

## 2021-01-06 NOTE — Progress Notes (Signed)
PCP - Shelly Flatten Cardiologist - denies  Chest x-ray - n/a EKG - 01-06-21  COVID TEST- 01-08-21   Anesthesia review: n/a  Patient denies shortness of breath, fever, cough and chest pain at PAT appointment   All instructions explained to the patient, with a verbal understanding of the material. Patient agrees to go over the instructions while at home for a better understanding. Patient also instructed to self quarantine after being tested for COVID-19. The opportunity to ask questions was provided.

## 2021-01-08 ENCOUNTER — Other Ambulatory Visit (HOSPITAL_COMMUNITY)
Admission: RE | Admit: 2021-01-08 | Discharge: 2021-01-08 | Disposition: A | Discharge status: Home / Self Care | Payer: 59 | Source: Ambulatory Visit | Attending: Neurosurgery | Admitting: Neurosurgery

## 2021-01-08 DIAGNOSIS — Z01812 Encounter for preprocedural laboratory examination: Secondary | ICD-10-CM | POA: Insufficient documentation

## 2021-01-08 DIAGNOSIS — Z20822 Contact with and (suspected) exposure to covid-19: Secondary | ICD-10-CM | POA: Insufficient documentation

## 2021-01-09 LAB — SARS CORONAVIRUS 2 (TAT 6-24 HRS): SARS Coronavirus 2: NEGATIVE

## 2021-01-11 ENCOUNTER — Other Ambulatory Visit: Payer: Self-pay

## 2021-01-11 ENCOUNTER — Inpatient Hospital Stay (HOSPITAL_COMMUNITY): Payer: 59 | Admitting: Certified Registered Nurse Anesthetist

## 2021-01-11 ENCOUNTER — Encounter (HOSPITAL_COMMUNITY): Payer: Self-pay | Admitting: Neurosurgery

## 2021-01-11 ENCOUNTER — Inpatient Hospital Stay (HOSPITAL_COMMUNITY)
Admission: RE | Disposition: A | Discharge status: Home / Self Care | Payer: Self-pay | Source: Home / Self Care | Attending: Neurosurgery

## 2021-01-11 ENCOUNTER — Inpatient Hospital Stay (HOSPITAL_COMMUNITY)
Admission: RE | Admit: 2021-01-11 | Discharge: 2021-01-12 | DRG: 455 | Disposition: A | Discharge status: Home / Self Care | Payer: 59 | Attending: Neurosurgery | Admitting: Neurosurgery

## 2021-01-11 ENCOUNTER — Inpatient Hospital Stay (HOSPITAL_COMMUNITY): Payer: 59

## 2021-01-11 DIAGNOSIS — Z79899 Other long term (current) drug therapy: Secondary | ICD-10-CM | POA: Diagnosis not present

## 2021-01-11 DIAGNOSIS — M48061 Spinal stenosis, lumbar region without neurogenic claudication: Secondary | ICD-10-CM | POA: Diagnosis present

## 2021-01-11 DIAGNOSIS — M5116 Intervertebral disc disorders with radiculopathy, lumbar region: Principal | ICD-10-CM | POA: Diagnosis present

## 2021-01-11 DIAGNOSIS — Z87442 Personal history of urinary calculi: Secondary | ICD-10-CM | POA: Diagnosis not present

## 2021-01-11 DIAGNOSIS — Z96643 Presence of artificial hip joint, bilateral: Secondary | ICD-10-CM | POA: Diagnosis present

## 2021-01-11 DIAGNOSIS — Z20822 Contact with and (suspected) exposure to covid-19: Secondary | ICD-10-CM | POA: Diagnosis present

## 2021-01-11 DIAGNOSIS — I10 Essential (primary) hypertension: Secondary | ICD-10-CM | POA: Diagnosis present

## 2021-01-11 DIAGNOSIS — M47896 Other spondylosis, lumbar region: Secondary | ICD-10-CM | POA: Diagnosis present

## 2021-01-11 DIAGNOSIS — Z419 Encounter for procedure for purposes other than remedying health state, unspecified: Secondary | ICD-10-CM

## 2021-01-11 DIAGNOSIS — M4186 Other forms of scoliosis, lumbar region: Secondary | ICD-10-CM | POA: Diagnosis present

## 2021-01-11 DIAGNOSIS — M5126 Other intervertebral disc displacement, lumbar region: Secondary | ICD-10-CM | POA: Diagnosis present

## 2021-01-11 DIAGNOSIS — Z981 Arthrodesis status: Secondary | ICD-10-CM

## 2021-01-11 HISTORY — PX: LUMBAR FUSION: SHX111

## 2021-01-11 SURGERY — POSTERIOR LUMBAR FUSION 2 LEVEL
Anesthesia: General | Site: Spine Lumbar

## 2021-01-11 MED ORDER — CEFAZOLIN SODIUM-DEXTROSE 2-4 GM/100ML-% IV SOLN
2.0000 g | Freq: Three times a day (TID) | INTRAVENOUS | Status: DC
  Administered 2021-01-11 – 2021-01-12 (×3): 2 g via INTRAVENOUS
  Filled 2021-01-11 (×3): qty 100

## 2021-01-11 MED ORDER — PHENYLEPHRINE 40 MCG/ML (10ML) SYRINGE FOR IV PUSH (FOR BLOOD PRESSURE SUPPORT)
PREFILLED_SYRINGE | INTRAVENOUS | Status: DC | PRN
  Administered 2021-01-11: 120 ug via INTRAVENOUS
  Administered 2021-01-11: 40 ug via INTRAVENOUS
  Administered 2021-01-11: 80 ug via INTRAVENOUS
  Administered 2021-01-11: 40 ug via INTRAVENOUS

## 2021-01-11 MED ORDER — THROMBIN 20000 UNITS EX SOLR: CUTANEOUS | Status: DC | PRN

## 2021-01-11 MED ORDER — ONDANSETRON HCL 4 MG/2ML IJ SOLN: 4.0000 mg | Freq: Four times a day (QID) | INTRAMUSCULAR | Status: DC | PRN

## 2021-01-11 MED ORDER — LIDOCAINE 2% (20 MG/ML) 5 ML SYRINGE
INTRAMUSCULAR | Status: AC
  Filled 2021-01-11: qty 5

## 2021-01-11 MED ORDER — PANTOPRAZOLE SODIUM 40 MG PO TBEC
40.0000 mg | DELAYED_RELEASE_TABLET | Freq: Every day | ORAL | Status: DC
  Filled 2021-01-11: qty 1

## 2021-01-11 MED ORDER — OXYCODONE HCL 5 MG PO TABS
10.0000 mg | ORAL_TABLET | ORAL | Status: DC | PRN
  Administered 2021-01-11 (×2): 10 mg via ORAL
  Administered 2021-01-11: 5 mg via ORAL
  Administered 2021-01-12 (×2): 10 mg via ORAL
  Filled 2021-01-11 (×4): qty 2

## 2021-01-11 MED ORDER — ALUM & MAG HYDROXIDE-SIMETH 200-200-20 MG/5ML PO SUSP: 30.0000 mL | Freq: Four times a day (QID) | ORAL | Status: DC | PRN

## 2021-01-11 MED ORDER — LISINOPRIL 20 MG PO TABS
40.0000 mg | ORAL_TABLET | ORAL | Status: DC
  Filled 2021-01-11: qty 2

## 2021-01-11 MED ORDER — LIDOCAINE 2% (20 MG/ML) 5 ML SYRINGE
INTRAMUSCULAR | Status: DC | PRN
  Administered 2021-01-11: 100 mg via INTRAVENOUS

## 2021-01-11 MED ORDER — THROMBIN 20000 UNITS EX SOLR
CUTANEOUS | Status: AC
  Filled 2021-01-11: qty 20000

## 2021-01-11 MED ORDER — ONDANSETRON HCL 4 MG/2ML IJ SOLN
4.0000 mg | Freq: Once | INTRAMUSCULAR | Status: AC | PRN
  Administered 2021-01-11: 4 mg via INTRAVENOUS

## 2021-01-11 MED ORDER — SODIUM CHLORIDE 0.9% FLUSH: 3.0000 mL | INTRAVENOUS | Status: DC | PRN

## 2021-01-11 MED ORDER — ROCURONIUM BROMIDE 10 MG/ML (PF) SYRINGE
PREFILLED_SYRINGE | INTRAVENOUS | Status: DC | PRN
  Administered 2021-01-11: 20 mg via INTRAVENOUS
  Administered 2021-01-11: 30 mg via INTRAVENOUS
  Administered 2021-01-11: 70 mg via INTRAVENOUS
  Administered 2021-01-11: 50 mg via INTRAVENOUS

## 2021-01-11 MED ORDER — DEXAMETHASONE SODIUM PHOSPHATE 10 MG/ML IJ SOLN
INTRAMUSCULAR | Status: DC | PRN
  Administered 2021-01-11: 10 mg via INTRAVENOUS

## 2021-01-11 MED ORDER — FENTANYL CITRATE (PF) 250 MCG/5ML IJ SOLN
INTRAMUSCULAR | Status: DC | PRN
  Administered 2021-01-11 (×3): 50 ug via INTRAVENOUS
  Administered 2021-01-11: 100 ug via INTRAVENOUS

## 2021-01-11 MED ORDER — LIDOCAINE-EPINEPHRINE 1 %-1:100000 IJ SOLN
INTRAMUSCULAR | Status: DC | PRN
  Administered 2021-01-11: 10 mL

## 2021-01-11 MED ORDER — PHENYLEPHRINE HCL (PRESSORS) 10 MG/ML IV SOLN
INTRAVENOUS | Status: AC
  Filled 2021-01-11: qty 1

## 2021-01-11 MED ORDER — ROCURONIUM BROMIDE 10 MG/ML (PF) SYRINGE
PREFILLED_SYRINGE | INTRAVENOUS | Status: AC
  Filled 2021-01-11: qty 10

## 2021-01-11 MED ORDER — PROPOFOL 10 MG/ML IV BOLUS
INTRAVENOUS | Status: AC
  Filled 2021-01-11: qty 20

## 2021-01-11 MED ORDER — OXYCODONE HCL 5 MG PO TABS
ORAL_TABLET | ORAL | Status: AC
  Administered 2021-01-11: 5 mg
  Filled 2021-01-11: qty 2

## 2021-01-11 MED ORDER — DEXAMETHASONE SODIUM PHOSPHATE 10 MG/ML IJ SOLN
INTRAMUSCULAR | Status: AC
  Filled 2021-01-11: qty 1

## 2021-01-11 MED ORDER — CHLORHEXIDINE GLUCONATE 0.12 % MT SOLN: 15.0000 mL | Freq: Once | OROMUCOSAL | Status: AC

## 2021-01-11 MED ORDER — SODIUM CHLORIDE 0.9% FLUSH
3.0000 mL | Freq: Two times a day (BID) | INTRAVENOUS | Status: DC
  Administered 2021-01-11: 3 mL via INTRAVENOUS

## 2021-01-11 MED ORDER — THROMBIN 5000 UNITS EX SOLR
CUTANEOUS | Status: AC
  Filled 2021-01-11: qty 5000

## 2021-01-11 MED ORDER — HYDROMORPHONE HCL 1 MG/ML IJ SOLN
INTRAMUSCULAR | Status: AC
  Filled 2021-01-11: qty 0.5

## 2021-01-11 MED ORDER — HYDROMORPHONE HCL 1 MG/ML IJ SOLN
0.5000 mg | INTRAMUSCULAR | Status: DC | PRN
  Filled 2021-01-11: qty 0.5

## 2021-01-11 MED ORDER — PHENOL 1.4 % MT LIQD: 1.0000 | OROMUCOSAL | Status: DC | PRN

## 2021-01-11 MED ORDER — SIMVASTATIN 20 MG PO TABS
40.0000 mg | ORAL_TABLET | Freq: Every day | ORAL | Status: DC
  Filled 2021-01-11: qty 2

## 2021-01-11 MED ORDER — ONDANSETRON HCL 4 MG/2ML IJ SOLN
INTRAMUSCULAR | Status: AC
  Filled 2021-01-11: qty 2

## 2021-01-11 MED ORDER — PANTOPRAZOLE SODIUM 40 MG IV SOLR: 40.0000 mg | Freq: Every day | INTRAVENOUS | Status: DC

## 2021-01-11 MED ORDER — ORAL CARE MOUTH RINSE: 15.0000 mL | Freq: Once | OROMUCOSAL | Status: AC

## 2021-01-11 MED ORDER — ALLOPURINOL 300 MG PO TABS
300.0000 mg | ORAL_TABLET | ORAL | Status: DC
  Administered 2021-01-12: 300 mg via ORAL
  Filled 2021-01-11: qty 1

## 2021-01-11 MED ORDER — FENTANYL CITRATE (PF) 250 MCG/5ML IJ SOLN
INTRAMUSCULAR | Status: AC
  Filled 2021-01-11: qty 5

## 2021-01-11 MED ORDER — PROPOFOL 10 MG/ML IV BOLUS
INTRAVENOUS | Status: DC | PRN
  Administered 2021-01-11: 40 mg via INTRAVENOUS
  Administered 2021-01-11: 150 mg via INTRAVENOUS

## 2021-01-11 MED ORDER — PHENYLEPHRINE HCL-NACL 10-0.9 MG/250ML-% IV SOLN
INTRAVENOUS | Status: DC | PRN
  Administered 2021-01-11: 10 ug/min via INTRAVENOUS

## 2021-01-11 MED ORDER — ADULT MULTIVITAMIN W/MINERALS CH
1.0000 | ORAL_TABLET | Freq: Every day | ORAL | Status: DC
  Administered 2021-01-11: 1 via ORAL
  Filled 2021-01-11 (×2): qty 1

## 2021-01-11 MED ORDER — DEXAMETHASONE SODIUM PHOSPHATE 10 MG/ML IJ SOLN: 10.0000 mg | Freq: Once | INTRAMUSCULAR | Status: DC

## 2021-01-11 MED ORDER — SODIUM CHLORIDE 0.9 % IV SOLN: INTRAVENOUS | Status: DC | PRN

## 2021-01-11 MED ORDER — CHLORHEXIDINE GLUCONATE CLOTH 2 % EX PADS: 6.0000 | MEDICATED_PAD | Freq: Once | CUTANEOUS | Status: DC

## 2021-01-11 MED ORDER — THROMBIN 5000 UNITS EX SOLR: OROMUCOSAL | Status: DC | PRN

## 2021-01-11 MED ORDER — CEFAZOLIN SODIUM-DEXTROSE 2-4 GM/100ML-% IV SOLN
INTRAVENOUS | Status: AC
  Filled 2021-01-11: qty 100

## 2021-01-11 MED ORDER — ACETAMINOPHEN 650 MG RE SUPP: 650.0000 mg | RECTAL | Status: DC | PRN

## 2021-01-11 MED ORDER — ACETAMINOPHEN 325 MG PO TABS
650.0000 mg | ORAL_TABLET | ORAL | Status: DC | PRN
  Administered 2021-01-11: 650 mg via ORAL
  Filled 2021-01-11 (×2): qty 2

## 2021-01-11 MED ORDER — CYCLOBENZAPRINE HCL 10 MG PO TABS
ORAL_TABLET | ORAL | Status: AC
  Filled 2021-01-11: qty 1

## 2021-01-11 MED ORDER — SUGAMMADEX SODIUM 200 MG/2ML IV SOLN
INTRAVENOUS | Status: DC | PRN
  Administered 2021-01-11: 200 mg via INTRAVENOUS

## 2021-01-11 MED ORDER — HYDROMORPHONE HCL 1 MG/ML IJ SOLN
INTRAMUSCULAR | Status: DC | PRN
  Administered 2021-01-11: .5 mg via INTRAVENOUS

## 2021-01-11 MED ORDER — ACETAMINOPHEN 500 MG PO TABS: 500.0000 mg | ORAL_TABLET | Freq: Four times a day (QID) | ORAL | Status: DC | PRN

## 2021-01-11 MED ORDER — BUPIVACAINE LIPOSOME 1.3 % IJ SUSP
INTRAMUSCULAR | Status: DC | PRN
  Administered 2021-01-11: 20 mL

## 2021-01-11 MED ORDER — ACETAMINOPHEN 10 MG/ML IV SOLN
INTRAVENOUS | Status: AC
  Filled 2021-01-11: qty 100

## 2021-01-11 MED ORDER — OXYCODONE HCL 5 MG PO TABS
5.0000 mg | ORAL_TABLET | Freq: Once | ORAL | Status: DC | PRN
Start: 2021-01-11 — End: 2021-01-11

## 2021-01-11 MED ORDER — MIDAZOLAM HCL 2 MG/2ML IJ SOLN
INTRAMUSCULAR | Status: AC
  Filled 2021-01-11: qty 2

## 2021-01-11 MED ORDER — ACETAMINOPHEN 10 MG/ML IV SOLN
1000.0000 mg | Freq: Once | INTRAVENOUS | Status: DC | PRN
  Administered 2021-01-11: 1000 mg via INTRAVENOUS

## 2021-01-11 MED ORDER — ONDANSETRON HCL 4 MG/2ML IJ SOLN
INTRAMUSCULAR | Status: DC | PRN
  Administered 2021-01-11: 4 mg via INTRAVENOUS

## 2021-01-11 MED ORDER — CEFAZOLIN SODIUM-DEXTROSE 2-4 GM/100ML-% IV SOLN
2.0000 g | INTRAVENOUS | Status: AC
  Administered 2021-01-11: 2 g via INTRAVENOUS

## 2021-01-11 MED ORDER — CYCLOBENZAPRINE HCL 10 MG PO TABS
10.0000 mg | ORAL_TABLET | Freq: Three times a day (TID) | ORAL | Status: DC | PRN
Start: 2021-01-11 — End: 2021-01-12
  Administered 2021-01-11 – 2021-01-12 (×3): 10 mg via ORAL
  Filled 2021-01-11 (×2): qty 1

## 2021-01-11 MED ORDER — 0.9 % SODIUM CHLORIDE (POUR BTL) OPTIME
TOPICAL | Status: DC | PRN
  Administered 2021-01-11: 1000 mL

## 2021-01-11 MED ORDER — SODIUM CHLORIDE 0.9 % IV SOLN: 250.0000 mL | INTRAVENOUS | Status: DC

## 2021-01-11 MED ORDER — ONDANSETRON HCL 4 MG PO TABS: 4.0000 mg | ORAL_TABLET | Freq: Four times a day (QID) | ORAL | Status: DC | PRN

## 2021-01-11 MED ORDER — OXYCODONE HCL 5 MG/5ML PO SOLN: 5.0000 mg | Freq: Once | ORAL | Status: DC | PRN

## 2021-01-11 MED ORDER — LIDOCAINE-EPINEPHRINE 1 %-1:100000 IJ SOLN
INTRAMUSCULAR | Status: AC
  Filled 2021-01-11: qty 1

## 2021-01-11 MED ORDER — ALBUMIN HUMAN 5 % IV SOLN: INTRAVENOUS | Status: DC | PRN

## 2021-01-11 MED ORDER — LACTATED RINGERS IV SOLN: INTRAVENOUS | Status: DC

## 2021-01-11 MED ORDER — HYDROMORPHONE HCL 1 MG/ML IJ SOLN
0.2500 mg | INTRAMUSCULAR | Status: DC | PRN
  Administered 2021-01-11: 0.5 mg via INTRAVENOUS

## 2021-01-11 MED ORDER — MENTHOL 3 MG MT LOZG: 1.0000 | LOZENGE | OROMUCOSAL | Status: DC | PRN

## 2021-01-11 MED ORDER — HYDROMORPHONE HCL 1 MG/ML IJ SOLN
INTRAMUSCULAR | Status: AC
  Filled 2021-01-11: qty 1

## 2021-01-11 MED ORDER — BUPIVACAINE LIPOSOME 1.3 % IJ SUSP
INTRAMUSCULAR | Status: AC
  Filled 2021-01-11: qty 20

## 2021-01-11 MED ORDER — MIDAZOLAM HCL 2 MG/2ML IJ SOLN
INTRAMUSCULAR | Status: DC | PRN
  Administered 2021-01-11: 2 mg via INTRAVENOUS

## 2021-01-11 MED ORDER — PHENYLEPHRINE 40 MCG/ML (10ML) SYRINGE FOR IV PUSH (FOR BLOOD PRESSURE SUPPORT)
PREFILLED_SYRINGE | INTRAVENOUS | Status: AC
  Filled 2021-01-11: qty 10

## 2021-01-11 MED ORDER — CHLORHEXIDINE GLUCONATE 0.12 % MT SOLN
OROMUCOSAL | Status: AC
  Administered 2021-01-11: 15 mL via OROMUCOSAL
  Filled 2021-01-11: qty 15

## 2021-01-11 SURGICAL SUPPLY — 72 items
ADH SKN CLS APL DERMABOND .7 (GAUZE/BANDAGES/DRESSINGS) ×1
APL SKNCLS STERI-STRIP NONHPOA (GAUZE/BANDAGES/DRESSINGS) ×1
BASKET BONE COLLECTION (BASKET) ×2 IMPLANT
BENZOIN TINCTURE PRP APPL 2/3 (GAUZE/BANDAGES/DRESSINGS) ×1 IMPLANT
BIT DRILL 5.0/4.0 (BIT) IMPLANT
BLADE CLIPPER SURG (BLADE) ×1 IMPLANT
BLADE SURG 11 STRL SS (BLADE) ×2 IMPLANT
BONE VIVIGEN FORMABLE 5.4CC (Bone Implant) ×2 IMPLANT
BUR CUTTER 7.0 ROUND (BURR) ×2 IMPLANT
BUR MATCHSTICK NEURO 3.0 LAGG (BURR) ×2 IMPLANT
CANISTER SUCT 3000ML PPV (MISCELLANEOUS) ×2 IMPLANT
CAP LOCKING THREADED (Cap) ×6 IMPLANT
CARTRIDGE OIL MAESTRO DRILL (MISCELLANEOUS) ×1 IMPLANT
CNTNR URN SCR LID CUP LEK RST (MISCELLANEOUS) ×1 IMPLANT
CONT SPEC 4OZ STRL OR WHT (MISCELLANEOUS) ×2
COVER BACK TABLE 60X90IN (DRAPES) ×2 IMPLANT
DERMABOND ADVANCED (GAUZE/BANDAGES/DRESSINGS) ×1
DERMABOND ADVANCED .7 DNX12 (GAUZE/BANDAGES/DRESSINGS) ×1 IMPLANT
DIFFUSER DRILL AIR PNEUMATIC (MISCELLANEOUS) ×2 IMPLANT
DRAPE C-ARM 42X72 X-RAY (DRAPES) ×3 IMPLANT
DRAPE HALF SHEET 40X57 (DRAPES) ×1 IMPLANT
DRAPE LAPAROTOMY 100X72X124 (DRAPES) ×2 IMPLANT
DRAPE SURG 17X23 STRL (DRAPES) ×2 IMPLANT
DRILL 5.0/4.0 (BIT) ×2
DRSG OPSITE 4X5.5 SM (GAUZE/BANDAGES/DRESSINGS) ×1 IMPLANT
DRSG OPSITE POSTOP 4X6 (GAUZE/BANDAGES/DRESSINGS) ×1 IMPLANT
DURAPREP 26ML APPLICATOR (WOUND CARE) ×3 IMPLANT
ELECT REM PT RETURN 9FT ADLT (ELECTROSURGICAL) ×2
ELECTRODE REM PT RTRN 9FT ADLT (ELECTROSURGICAL) ×1 IMPLANT
GAUZE SPONGE 4X4 12PLY STRL (GAUZE/BANDAGES/DRESSINGS) ×1 IMPLANT
GLOVE BIO SURGEON STRL SZ7 (GLOVE) ×3 IMPLANT
GLOVE BIO SURGEON STRL SZ8 (GLOVE) ×5 IMPLANT
GLOVE INDICATOR 8.5 STRL (GLOVE) ×5 IMPLANT
GLOVE SURG GAMMEX PI TX LF 6.5 (GLOVE) ×2 IMPLANT
GLOVE SURG SYN 6.5 ES PF (GLOVE) ×2 IMPLANT
GLOVE SURG SYN 6.5 PF PI (GLOVE) IMPLANT
GLOVE SURG UNDER POLY LF SZ7 (GLOVE) ×2 IMPLANT
GOWN STRL REUS W/ TWL LRG LVL3 (GOWN DISPOSABLE) IMPLANT
GOWN STRL REUS W/ TWL XL LVL3 (GOWN DISPOSABLE) ×2 IMPLANT
GOWN STRL REUS W/TWL 2XL LVL3 (GOWN DISPOSABLE) ×2 IMPLANT
GOWN STRL REUS W/TWL LRG LVL3 (GOWN DISPOSABLE) ×6
GOWN STRL REUS W/TWL XL LVL3 (GOWN DISPOSABLE) ×4
GRAFT BNE MATRIX VG FRMBL MD 5 (Bone Implant) IMPLANT
GRAFT BONE PROTEIOS LRG 5CC (Orthopedic Implant) ×1 IMPLANT
HEMOSTAT POWDER KIT SURGIFOAM (HEMOSTASIS) ×2 IMPLANT
KIT BASIN OR (CUSTOM PROCEDURE TRAY) ×2 IMPLANT
KIT POSITION SURG JACKSON T1 (MISCELLANEOUS) ×2 IMPLANT
KIT TURNOVER KIT B (KITS) ×2 IMPLANT
MILL MEDIUM DISP (BLADE) ×2 IMPLANT
NDL HYPO 21X1.5 SAFETY (NEEDLE) ×1 IMPLANT
NDL HYPO 25X1 1.5 SAFETY (NEEDLE) ×1 IMPLANT
NEEDLE HYPO 21X1.5 SAFETY (NEEDLE) ×2 IMPLANT
NEEDLE HYPO 25X1 1.5 SAFETY (NEEDLE) ×2 IMPLANT
NS IRRIG 1000ML POUR BTL (IV SOLUTION) ×2 IMPLANT
OIL CARTRIDGE MAESTRO DRILL (MISCELLANEOUS) ×2
PACK LAMINECTOMY NEURO (CUSTOM PROCEDURE TRAY) ×2 IMPLANT
ROD 70MM SPINAL (Rod) ×1 IMPLANT
ROD 75MM SPINAL (Rod) ×1 IMPLANT
SCREW PA THRD CREO TULIP 5.5X4 (Head) ×6 IMPLANT
SHAFT CREO 30MM (Neuro Prosthesis/Implant) ×6 IMPLANT
SPACER SUSTAIN RT 12 8D 9X26 (Spacer) ×4 IMPLANT
SPONGE SURGIFOAM ABS GEL 100 (HEMOSTASIS) ×3 IMPLANT
STRIP CLOSURE SKIN 1/2X4 (GAUZE/BANDAGES/DRESSINGS) ×3 IMPLANT
SUT VIC AB 0 CT1 18XCR BRD8 (SUTURE) ×1 IMPLANT
SUT VIC AB 0 CT1 8-18 (SUTURE) ×2
SUT VIC AB 2-0 CT1 18 (SUTURE) ×2 IMPLANT
SUT VIC AB 4-0 PS2 27 (SUTURE) ×2 IMPLANT
SYR 20ML LL LF (SYRINGE) ×2 IMPLANT
TOWEL GREEN STERILE (TOWEL DISPOSABLE) ×2 IMPLANT
TOWEL GREEN STERILE FF (TOWEL DISPOSABLE) ×2 IMPLANT
TRAY FOLEY MTR SLVR 16FR STAT (SET/KITS/TRAYS/PACK) ×2 IMPLANT
WATER STERILE IRR 1000ML POUR (IV SOLUTION) ×2 IMPLANT

## 2021-01-11 NOTE — Op Note (Signed)
Preoperative diagnosis: Recurrent herniated nucleus pulposus L3-4 L4-5 lumbar spinal stenosis L3-4 L4-5 (right L4-L5 radiculopathies  Postoperative diagnosis: Same  Procedure: #1 redo complete decompressive lumbar laminectomies L3-4 L4-5 with complete medial facetectomies radical foraminotomies in excess and requiring more work than would be needed with a standard interbody fusion  2.  Posterior lumbar interbody fusion L3-4 L4-5 utilizing the globus insert and rotate titanium cages packed with locally harvested autograft mixed with vivigen and Proteus  3.  Cortical screw fixation L3-L5 utilizing globus Creo amp modular cortical screw set  Surgeon: Jillyn Hidden Sigurd Pugh  Assistant: Julien Girt  Anesthesia: General  EBL: Minimal  HPI: 65 year old gentleman longstanding issues with his back previously undergone decompressive laminectomies at L3-4 and L4-5 work-up revealed recurrent disc herniations at L3-4 and L4-5 severe spinal stenosis and degenerative's disease.  Due to patient's progression of clinical syndrome imaging findings and failed conservative treatment I recommended redo decompressive laminectomies and interbody fusions at those levels.  I extensively reviewed the risks and benefits of the operation with him as well as perioperative course expectations of outcome and alternatives to surgery and he understood and agreed to proceed forward.  Operative procedure: Patient was brought into the OR was [facility] general anesthesia position prone Wilson frame his back was prepped and draped in routine sterile fashion.  His old incision was opened up the scar tissue was dissected free and subperiosteal dissection was carried out on the residual lamina at L3 I exposed the facet joints at L3-4 and L4-5 as well as a cortical screw entry point at L3.  After intraoperative x-ray confirmed identification appropriate level spinous process at L3 was removed redo central decompressive Lam was performed complete  medial facetectomies were performed and aggressive under biting of both articulating facets allowed access to the lateral margin disc base completely unroofing the L3 and L4 foramens both L3-4 and L4-5 facet joints were completely disarticulated.  The scar tissue was dissected off of the medial aspect the facet joints both levels and from foraminotomies were performed at L4 and L5 as well.  Then both disc bases were incised first working at L3-4 disc base was cleaned out there was a large amount of right with disc herniation at L3-4 as well as some central disc all this was removed endplates were prepared with a 12 distractor in place cage was placed local autograft was packed centrally and then the contralateral cage was placed under fluoroscopy in a similar fashion L4-5 was cleaned out the pathology there was primarily leftward disc herniation all that disc was cleaned out endplates were prepared both cages were inserted with extensive mount of autograft centrally as well.  All this helped straighten out a scoliotic deformity.  Then 10-second cortical screw placement under fluoroscopy.  All screws had excellent purchase and postop AP and lateral fluoroscopy confirmed good position of all the implants we assembled the heads advanced the screws checked all the foramina to confirm patency no migration no migration of graft material.  Then rods were placed L4 was compressed against L5 L3 was compressed against L4 Gelfoam was ON top of the dura a medium Hemovac drain was placed and Exparel was injected in the fascia.  Wound was copiously irrigated meticulous position was maintained and the wound was closed in layers of Vicryl and a running 4 subcuticular.  Dermabond benzoin Steri-Strips and sterile dressings applied patient recovery in stable condition.  At the end the case all needle count sponge counts were correct.

## 2021-01-11 NOTE — H&P (Signed)
Kyle Lawson is an 65 y.o. male.   Chief Complaint: Back and left great and right leg pain HPI: 65 year old gentleman longstanding issues with back lumbar spondylosis stenosis degenerative scoliosis previous decompressive laminectomy who presents now for recurrent back and leg pain refractory to all forms of conservative treatment.  Work-up revealed progressive degeneration collapse of disc herniation severe stenosis at L3-4 and L4-5.  Due to patient's progression of clinical syndrome imaging findings and failed conservative treatment I recommended redo decompressive laminectomies L3-4 L4-5 posterior lumbar interbody fusions L3-4 and L4-5 I extensively gone over the risks and benefits of the operation with him as well as perioperative course expectations of outcome and alternatives of surgery and he understands and agrees to proceed forward.  Past Medical History:  Diagnosis Date  . Arthritis   . Gout   . History of kidney stones   . Hypertension     Past Surgical History:  Procedure Laterality Date  . Anterior Cervical Decompression Fusion  2019  . BACK SURGERY     1984 and 1996  . DISTAL CLAVICLE EXCISION Right    Right Shoulder Distal Clavicle Excision  . JOINT REPLACEMENT     left hip 09/2006  . LAMINECTOMY     L4-L5  . LAMINECTOMY     L3-L4  . TOTAL HIP ARTHROPLASTY  09/14/2011   Procedure: TOTAL HIP ARTHROPLASTY;  Surgeon: Loreta Ave, MD;  Location: [facility] OR;  Service: Orthopedics;  Laterality: Right;    History reviewed. No pertinent family history. Social History:  reports that he has never smoked. He has never used smokeless tobacco. He reports current alcohol use of about 8.0 standard drinks of alcohol per week. He reports current drug use. Drug: Marijuana.  Allergies: No Known Allergies  Medications Prior to Admission  Medication Sig Dispense Refill  . acetaminophen (TYLENOL) 500 MG tablet Take 500-1,000 mg by mouth every 6 (six) hours as needed for moderate pain.    [name]  allopurinol (ZYLOPRIM) 300 MG tablet Take 300 mg by mouth every morning.    [name] lisinopril (PRINIVIL,ZESTRIL) 40 MG tablet Take 40 mg by mouth every morning.    . Multiple Vitamin (MULTIVITAMIN WITH MINERALS) TABS tablet Take 1 tablet by mouth daily.    . naproxen (NAPROSYN) 500 MG tablet Take 1 tablet (500 mg total) by mouth 2 (two) times daily as needed. 60 tablet 0  . simvastatin (ZOCOR) 40 MG tablet Take 40 mg by mouth daily.    . Diclofenac Sodium (PENNSAID) 2 % SOLN Place 1 application onto the skin 2 (two) times daily. (Patient not taking: No sig reported) 112 g 2  . gabapentin (NEURONTIN) 300 MG capsule TAKE 1 CAPSULES BY MOUTH 3 TIMES DAILY AS NEEDED (Patient not taking: Reported on 12/31/2020) 90 capsule 2  . indomethacin (INDOCIN) 50 MG capsule Take 1 capsule (50 mg total) by mouth 3 (three) times daily with meals. (Patient not taking: Reported on 12/31/2020) 60 capsule 1  . PENNSAID 2 % SOLN APPLY 1 PUMP (1 GRAM) TO AFFECTED AREA TOPICALLY TWICE DAILY AS DIRECTED (Patient not taking: Reported on 12/31/2020) 112 g 2  . predniSONE (DELTASONE) 20 MG tablet Take 1 tablet (20 mg total) by mouth 2 (two) times daily. (Patient not taking: Reported on 12/31/2020) 10 tablet 0  . predniSONE (DELTASONE) 50 MG tablet Take 1 tablet (50 mg total) by mouth daily. (Patient not taking: Reported on 12/31/2020) 5 tablet 0  . predniSONE (DELTASONE) 50 MG tablet 1 tablet by mouth daily (Patient  not taking: Reported on 12/31/2020) 5 tablet 0  . venlafaxine XR (EFFEXOR XR) 37.5 MG 24 hr capsule Take 1 capsule (37.5 mg total) by mouth daily with breakfast. (Patient not taking: Reported on 12/31/2020) 30 capsule 0  . Vitamin D, Ergocalciferol, (DRISDOL) 1.25 MG (50000 UT) CAPS capsule TAKE 1 CAPSULE (50,000 UNITS TOTAL) BY MOUTH EVERY 7 (SEVEN) DAYS. (Patient not taking: Reported on 12/31/2020) 12 capsule 0    No results found for this or any previous visit (from the past 48 hour(s)). No results found.  Review of Systems   Musculoskeletal: Positive for back pain and myalgias.  Neurological: Positive for numbness.    Blood pressure 120/78, pulse 70, temperature 97.6 F (36.4 C), temperature source Oral, resp. rate 17, height 5\' 9"  (1.753 m), weight 82.8 kg, SpO2 98 %. Physical Exam HENT:     Head: Normocephalic.     Right Ear: Tympanic membrane normal.     Nose: Nose normal.  Eyes:     Pupils: Pupils are equal, round, and reactive to light.  Cardiovascular:     Pulses: Normal pulses.  Pulmonary:     Effort: Pulmonary effort is normal.  Abdominal:     General: Abdomen is flat.  Musculoskeletal:        General: Normal range of motion.  Skin:    General: Skin is warm.  Neurological:     General: No focal deficit present.     Mental Status: He is alert.     Comments: Strength 5 out of 5 iliopsoas, quads, hamstrings, gastroc, into tibialis, and EHL.      Assessment/Plan 65 year old presents for redo decompressive laminectomy L3-4 L4-5 posterior lumbar interbody fusions at those levels.  77, MD 01/11/2021, 8:29 AM

## 2021-01-11 NOTE — Anesthesia Procedure Notes (Signed)
Procedure Name: Intubation Date/Time: 01/11/2021 8:50 AM Performed by: Betha Loa, CRNA Pre-anesthesia Checklist: Patient identified, Emergency Drugs available, Suction available and Patient being monitored Patient Re-evaluated:Patient Re-evaluated prior to induction Oxygen Delivery Method: Circle System Utilized Preoxygenation: Pre-oxygenation with 100% oxygen Induction Type: IV induction Ventilation: Mask ventilation without difficulty Laryngoscope Size: Mac and 4 Grade View: Grade I Tube type: Oral Tube size: 7.5 mm Number of attempts: 1 Airway Equipment and Method: Stylet and Oral airway Placement Confirmation: ETT inserted through vocal cords under direct vision,  positive ETCO2 and breath sounds checked- equal and bilateral Secured at: 22 cm Tube secured with: Tape Dental Injury: Teeth and Oropharynx as per pre-operative assessment

## 2021-01-11 NOTE — Progress Notes (Signed)
Orthopedic Tech Progress Note Patient Details:  Kyle Lawson [birth year] [id number] Patient has BRACE Patient ID: [name], male   DOB: [birth year], 65 y.o.   MRN: [id number]   [name] 01/11/2021, 4:58 PM

## 2021-01-11 NOTE — Anesthesia Postprocedure Evaluation (Signed)
Anesthesia Post Note  Patient: Kyle Lawson  Procedure(s) Performed: LUMBAR THREE-FOUR, LUMBAR FOUR-FIVE POSTERIOR LUMBAR INTERBODY FUSION (N/A Spine Lumbar)     Patient location during evaluation: PACU Anesthesia Type: General Level of consciousness: awake and alert Pain management: pain level controlled Vital Signs Assessment: post-procedure vital signs reviewed and stable Respiratory status: spontaneous breathing, nonlabored ventilation, respiratory function stable and patient connected to nasal cannula oxygen Cardiovascular status: blood pressure returned to baseline and stable Postop Assessment: no apparent nausea or vomiting Anesthetic complications: no   No complications documented.  Last Vitals:  Vitals:   01/11/21 1315 01/11/21 1355  BP:  112/70  Pulse: 78 72  Resp: 18 17  Temp: 36.5 C 36.4 C  SpO2: 99% 100%    Last Pain:  Vitals:   01/11/21 1240  TempSrc:   PainSc: Asleep                 Keysean Savino S

## 2021-01-11 NOTE — Plan of Care (Signed)
  Problem: Education: Goal: Ability to verbalize activity precautions or restrictions will improve Outcome: Progressing   

## 2021-01-11 NOTE — Anesthesia Preprocedure Evaluation (Signed)
Anesthesia Evaluation  Patient identified by MRN, date of birth, ID band Patient awake    Reviewed: Allergy & Precautions, NPO status , Patient's Chart, lab work & pertinent test results  Airway Mallampati: II  TM Distance: >3 FB Neck ROM: Full    Dental no notable dental hx.    Pulmonary neg pulmonary ROS,    Pulmonary exam normal breath sounds clear to auscultation       Cardiovascular hypertension, Normal cardiovascular exam Rhythm:Regular Rate:Normal     Neuro/Psych negative neurological ROS  negative psych ROS   GI/Hepatic negative GI ROS, Neg liver ROS,   Endo/Other  negative endocrine ROS  Renal/GU negative Renal ROS  negative genitourinary   Musculoskeletal negative musculoskeletal ROS (+)   Abdominal   Peds negative pediatric ROS (+)  Hematology negative hematology ROS (+)   Anesthesia Other Findings   Reproductive/Obstetrics negative OB ROS                             Anesthesia Physical Anesthesia Plan  ASA: II  Anesthesia Plan: General   Post-op Pain Management:    Induction: Intravenous  PONV Risk Score and Plan: 3 and Ondansetron, Dexamethasone and Treatment may vary due to age or medical condition  Airway Management Planned: Oral ETT  Additional Equipment:   Intra-op Plan:   Post-operative Plan: Extubation in OR  Informed Consent: I have reviewed the patients History and Physical, chart, labs and discussed the procedure including the risks, benefits and alternatives for the proposed anesthesia with the patient or authorized representative who has indicated his/her understanding and acceptance.     Dental advisory given  Plan Discussed with: CRNA and Surgeon  Anesthesia Plan Comments:         Anesthesia Quick Evaluation  

## 2021-01-11 NOTE — Progress Notes (Signed)
NURSING PROGRESS NOTE  Kyle Lawson [id number] Admission Data: 01/11/2021 8:24 PM Attending Provider: Donalee Citrin, MD WGN:[id number], [name], MD Code Status: full  Kyle Lawson is a 65 y.o. male patient admitted from ED:  -No acute distress noted.  -No complaints of shortness of breath.  -No complaints of chest pain.   Cardiac Monitoring:    Blood pressure 105/67, pulse 73, temperature 98.2 F (36.8 C), temperature source Oral, resp. rate 20, height 5\' 9"  (1.753 m), weight 82.8 kg, SpO2 99 %.   IV Fluids:  IV in place, occlusive dsg intact without redness, IV cath forearm left, condition patent and no redness lactated Ringer's.   Allergies:  Patient has no known allergies.  Past Medical History:   has a past medical history of Arthritis, Gout, History of kidney stones, and Hypertension.  Past Surgical History:   has a past surgical history that includes Joint replacement; Back surgery; Total hip arthroplasty (09/14/2011); Anterior Cervical Decompression Fusion (2019); Distal clavicle excision (Right); Laminectomy; and Laminectomy.  Social History:   reports that he has never smoked. He has never used smokeless tobacco. He reports current alcohol use of about 8.0 standard drinks of alcohol per week. He reports current drug use. Drug: Marijuana.  Skin: Intact. Honeycomb dressing on back with guaze dressing   Patient/Family orientated to room. Information packet given to patient/family. Admission inpatient armband information verified with patient/family to include name and date of birth and placed on patient arm. Side rails up x 2, fall assessment and education completed with patient/family. Patient/family able to verbalize understanding of risk associated with falls and verbalized understanding to call for assistance before getting out of bed. Call light within reach. Patient/family able to voice and demonstrate understanding of unit orientation instructions.    Will continue to evaluate and  treat per MD orders.

## 2021-01-11 NOTE — Transfer of Care (Signed)
Immediate Anesthesia Transfer of Care Note  Patient: Kyle Lawson  Procedure(s) Performed: LUMBAR THREE-FOUR, LUMBAR FOUR-FIVE POSTERIOR LUMBAR INTERBODY FUSION (N/A Spine Lumbar)  Patient Location: PACU  Anesthesia Type:General  Level of Consciousness: patient cooperative and responds to stimulation  Airway & Oxygen Therapy: Patient Spontanous Breathing and Patient connected to face mask oxygen  Post-op Assessment: Report given to RN and Post -op Vital signs reviewed and stable  Post vital signs: Reviewed and stable  Last Vitals:  Vitals Value Taken Time  BP 112/76 01/11/21 1239  Temp    Pulse 86 01/11/21 1241  Resp 15 01/11/21 1241  SpO2 97 % 01/11/21 1241  Vitals shown include unvalidated device data.  Last Pain:  Vitals:   01/11/21 0749  TempSrc:   PainSc: 0-No pain      Patients Stated Pain Goal: 3 (01/11/21 0749)  Complications: No complications documented.

## 2021-01-12 MED ORDER — CYCLOBENZAPRINE HCL 10 MG PO TABS
10.0000 mg | ORAL_TABLET | Freq: Three times a day (TID) | ORAL | 0 refills | Status: DC | PRN
Start: 2021-01-12 — End: 2021-09-30

## 2021-01-12 MED FILL — Thrombin For Soln 20000 Unit: CUTANEOUS | Qty: 1 | Status: AC

## 2021-01-12 NOTE — Discharge Summary (Signed)
Physician Discharge Summary  Patient ID: Kyle Lawson MRN: [id number] DOB/AGE: 1956/03/06 65 y.o. Estimated body mass index is 26.97 kg/m as calculated from the following:   Height as of this encounter: 5\' 9"  (1.753 m).   Weight as of this encounter: 82.8 kg.   Admit date: 01/11/2021 Discharge date: 01/12/2021  Admission Diagnoses: Degenerative disc disease lumbar spinal stenosis recurrent herniated nucleus pulposus with fibrosis L3-4 L4-5  Discharge Diagnoses: Same Active Problems:   HNP (herniated nucleus pulposus), lumbar   Discharged Condition: good  Hospital Course: Patient admitted underwent redo decompressive laminectomy 3 4 and L4-5 with posterior lumbar interbody fusions as well as.  Postoperatively patient voiding spontaneously tolerating regular diet and stable for discharge.  Patient will discharge with scheduled follow-up in 1-2 wks  Consults: Significant Diagnostic Studies: Treatments:PLIF L34,L45 Discharge Exam: Blood pressure 101/65, pulse 71, temperature 98.2 F (36.8 C), temperature source Oral, resp. rate 16, height 5\' 9"  (1.753 m), weight 82.8 kg, SpO2 96 %. Strength 5-5) dry and intact  Disposition: Home   Allergies as of 01/12/2021   No Known Allergies     Medication List    TAKE these medications   acetaminophen 500 MG tablet Commonly known as: TYLENOL Take 500-1,000 mg by mouth every 6 (six) hours as needed for moderate pain.   allopurinol 300 MG tablet Commonly known as: ZYLOPRIM Take 300 mg by mouth every morning.   cyclobenzaprine 10 MG tablet Commonly known as: FLEXERIL Take 1 tablet (10 mg total) by mouth 3 (three) times daily as needed for muscle spasms.   gabapentin 300 MG capsule Commonly known as: NEURONTIN TAKE 1 CAPSULES BY MOUTH 3 TIMES DAILY AS NEEDED   indomethacin 50 MG capsule Commonly known as: INDOCIN Take 1 capsule (50 mg total) by mouth 3 (three) times daily with meals.   lisinopril 40 MG tablet Commonly known as:  ZESTRIL Take 40 mg by mouth every morning.   multivitamin with minerals Tabs tablet Take 1 tablet by mouth daily.   naproxen 500 MG tablet Commonly known as: NAPROSYN Take 1 tablet (500 mg total) by mouth 2 (two) times daily as needed.   Pennsaid 2 % Soln Generic drug: Diclofenac Sodium Place 1 application onto the skin 2 (two) times daily.   Pennsaid 2 % Soln Generic drug: Diclofenac Sodium APPLY 1 PUMP (1 GRAM) TO AFFECTED AREA TOPICALLY TWICE DAILY AS DIRECTED   predniSONE 50 MG tablet Commonly known as: DELTASONE Take 1 tablet (50 mg total) by mouth daily.   predniSONE 50 MG tablet Commonly known as: DELTASONE 1 tablet by mouth daily   predniSONE 20 MG tablet Commonly known as: DELTASONE Take 1 tablet (20 mg total) by mouth 2 (two) times daily.   simvastatin 40 MG tablet Commonly known as: ZOCOR Take 40 mg by mouth daily.   venlafaxine XR 37.5 MG 24 hr capsule Commonly known as: Effexor XR Take 1 capsule (37.5 mg total) by mouth daily with breakfast.   Vitamin D (Ergocalciferol) 1.25 MG (50000 UNIT) Caps capsule Commonly known as: DRISDOL TAKE 1 CAPSULE (50,000 UNITS TOTAL) BY MOUTH EVERY 7 (SEVEN) DAYS.        Signed: 01/12/2021, 7:31 AM

## 2021-01-12 NOTE — Progress Notes (Signed)
NURSING PROGRESS NOTE  Kyle Lawson [id number] Discharge Data: 01/12/2021 12:42 PM Attending Provider: Donalee Citrin, MD MCN:[id number], [name], MD     [name] to be D/C'd Home per MD order.  Discussed with the patient the After Visit Summary and all questions fully answered. Pulled hemovac and applied dressing.  All IV's discontinued with no bleeding noted. All belongings returned to patient for patient to take home.   Last Vital Signs:  Blood pressure 101/65, pulse 71, temperature 98.2 F (36.8 C), temperature source Oral, resp. rate 16, height 5\' 9"  (1.753 m), weight 82.8 kg, SpO2 96 %.  Discharge Medication List Allergies as of 01/12/2021   No Known Allergies     Medication List    TAKE these medications   acetaminophen 500 MG tablet Commonly known as: TYLENOL Take 500-1,000 mg by mouth every 6 (six) hours as needed for moderate pain.   allopurinol 300 MG tablet Commonly known as: ZYLOPRIM Take 300 mg by mouth every morning.   cyclobenzaprine 10 MG tablet Commonly known as: FLEXERIL Take 1 tablet (10 mg total) by mouth 3 (three) times daily as needed for muscle spasms.   gabapentin 300 MG capsule Commonly known as: NEURONTIN TAKE 1 CAPSULES BY MOUTH 3 TIMES DAILY AS NEEDED   indomethacin 50 MG capsule Commonly known as: INDOCIN Take 1 capsule (50 mg total) by mouth 3 (three) times daily with meals.   lisinopril 40 MG tablet Commonly known as: ZESTRIL Take 40 mg by mouth every morning.   multivitamin with minerals Tabs tablet Take 1 tablet by mouth daily.   naproxen 500 MG tablet Commonly known as: NAPROSYN Take 1 tablet (500 mg total) by mouth 2 (two) times daily as needed.   Pennsaid 2 % Soln Generic drug: Diclofenac Sodium Place 1 application onto the skin 2 (two) times daily.   Pennsaid 2 % Soln Generic drug: Diclofenac Sodium APPLY 1 PUMP (1 GRAM) TO AFFECTED AREA TOPICALLY TWICE DAILY AS DIRECTED   predniSONE 50 MG tablet Commonly known as:  DELTASONE Take 1 tablet (50 mg total) by mouth daily.   predniSONE 50 MG tablet Commonly known as: DELTASONE 1 tablet by mouth daily   predniSONE 20 MG tablet Commonly known as: DELTASONE Take 1 tablet (20 mg total) by mouth 2 (two) times daily.   simvastatin 40 MG tablet Commonly known as: ZOCOR Take 40 mg by mouth daily.   venlafaxine XR 37.5 MG 24 hr capsule Commonly known as: Effexor XR Take 1 capsule (37.5 mg total) by mouth daily with breakfast.   Vitamin D (Ergocalciferol) 1.25 MG (50000 UNIT) Caps capsule Commonly known as: DRISDOL TAKE 1 CAPSULE (50,000 UNITS TOTAL) BY MOUTH EVERY 7 (SEVEN) DAYS.

## 2021-01-12 NOTE — Evaluation (Signed)
Physical Therapy Evaluation and Discharge Patient Details Name: Kyle Lawson MRN: [id number] DOB: [birth year] Today's Date: 01/12/2021   History of Present Illness  Pt is a 65 y/o male who presents s/p L3-L5 PLIF on 01/11/2021. PMH significant for HTN, gout, B THR, L3-L3 laminectomies, R shoulder distal clavicle excision, ACDF.  Clinical Impression  Patient evaluated by Physical Therapy with no further acute PT needs identified. All education has been completed and the patient has no further questions. Pt was able to demonstrate transfers and ambulation with gross modified independence and no AD. Pt was educated on precautions, brace application/wearing schedule, appropriate activity progression, and car transfer. See below for any follow-up Physical Therapy or equipment needs. PT is signing off. Thank you for this referral.     Follow Up Recommendations No PT follow up;Supervision for mobility/OOB    Equipment Recommendations  None recommended by PT    Recommendations for Other Services       Precautions / Restrictions Precautions Precautions: Fall;Back Precaution Booklet Issued: Yes (comment) Precaution Comments: Reviewed handout and pt was cued for precautions during functional mobility. Required Braces or Orthoses: Spinal Brace Spinal Brace: Lumbar corset;Applied in sitting position Restrictions Weight Bearing Restrictions: No      Mobility  Bed Mobility Overal bed mobility: Modified Independent Bed Mobility: Rolling;Sidelying to Sit           General bed mobility comments: HOB flat. Pt was able to demonstrate good log roll technique without assistance.    Transfers Overall transfer level: Modified independent Equipment used: None             General transfer comment: Overall good posture and general safety.  Ambulation/Gait Ambulation/Gait assistance: Modified independent (Device/Increase time) Gait Distance (Feet): 400 Feet Assistive device: None Gait  Pattern/deviations: Step-through pattern;Decreased stride length;Trunk flexed Gait velocity: Decreased Gait velocity interpretation: <1.31 ft/sec, indicative of household ambulator General Gait Details: VC's for abdominal bracing during ambulation. Pt with good posture overall. Mild drifting noted but no overt LOB noted.  Stairs Stairs: Yes Stairs assistance: Modified independent (Device/Increase time) Stair Management: One rail Left;Step to pattern;Alternating pattern;Forwards Number of Stairs: 10 General stair comments: Alternating to step-to ascending and step-to descending.  Wheelchair Mobility    Modified Rankin (Stroke Patients Only)       Balance Overall balance assessment: No apparent balance deficits (not formally assessed)                                           Pertinent Vitals/Pain Pain Assessment: Faces Faces Pain Scale: Hurts a little bit Pain Location: Incision site Pain Descriptors / Indicators: Operative site guarding Pain Intervention(s): Limited activity within patient's tolerance;Monitored during session;Repositioned    Home Living Family/patient expects to be discharged to:: Private residence Living Arrangements: Spouse/significant other Available Help at Discharge: Family;Available 24 hours/day Type of Home: House Home Access: Stairs to enter   [employer] of Steps: 3 Home Layout: Two level Home Equipment: Grab bars - tub/shower;Grab bars - toilet Additional Comments: Wife had a TBI 14 months ago so inside of house is handicap accessible.    Prior Function Level of Independence: Independent         Comments: Works from home. Does not have to assist wife with any ADL's     Hand Dominance        Extremity/Trunk Assessment   Upper Extremity Assessment Upper Extremity Assessment: Overall  WFL for tasks assessed    Lower Extremity Assessment Lower Extremity Assessment: Overall WFL for tasks assessed     Cervical / Trunk Assessment Cervical / Trunk Assessment: Other exceptions Cervical / Trunk Exceptions: s/p surgery  Communication   Communication: No difficulties  Cognition Arousal/Alertness: Awake/alert Behavior During Therapy: WFL for tasks assessed/performed Overall Cognitive Status: Within Functional Limits for tasks assessed                                        General Comments      Exercises     Assessment/Plan    PT Assessment Patent does not need any further PT services  PT Problem List Decreased strength;Decreased activity tolerance;Decreased balance;Decreased mobility;Decreased knowledge of use of DME;Decreased safety awareness;Decreased knowledge of precautions;Pain       PT Treatment Interventions      PT Goals (Current goals can be found in the Care Plan section)  Acute Rehab PT Goals Patient Stated Goal: Home today, get back to exercising and to be able to enjoy his retirement in September PT Goal Formulation: All assessment and education complete, DC therapy    Frequency     Barriers to discharge        Co-evaluation               AM-PAC PT "6 Clicks" Mobility  Outcome Measure Help needed turning from your back to your side while in a flat bed without using bedrails?: None Help needed moving from lying on your back to sitting on the side of a flat bed without using bedrails?: None Help needed moving to and from a bed to a chair (including a wheelchair)?: None Help needed standing up from a chair using your arms (e.g., wheelchair or bedside chair)?: None Help needed to walk in hospital room?: None Help needed climbing 3-5 steps with a railing? : None 6 Click Score: 24    End of Session Equipment Utilized During Treatment: Gait belt;Back brace Activity Tolerance: Patient tolerated treatment well Patient left: Other (comment) (Pt wishing to ambulate more in the hall) Nurse Communication: Mobility status PT Visit Diagnosis:  Unsteadiness on feet (R26.81);Pain Pain - part of body:  (back)    Time: 1194-1740 PT Time Calculation (min) (ACUTE ONLY): 24 min   Charges:   PT Evaluation $PT Eval Low Complexity: 1 Low PT Treatments $Gait Training: 8-22 mins        Conni Slipper, PT, DPT Acute Rehabilitation Services Pager: [phone] Office: [phone]   Marylynn Pearson 01/12/2021, 11:09 AM

## 2021-01-12 NOTE — Plan of Care (Signed)
  Problem: Education: Goal: Ability to verbalize activity precautions or restrictions will improve Outcome: Completed/Met Goal: Knowledge of the prescribed therapeutic regimen will improve Outcome: Completed/Met Goal: Understanding of discharge needs will improve Outcome: Completed/Met   Problem: Activity: Goal: Ability to avoid complications of mobility impairment will improve Outcome: Completed/Met Goal: Ability to tolerate increased activity will improve Outcome: Completed/Met Goal: Will remain free from falls Outcome: Completed/Met   Problem: Clinical Measurements: Goal: Ability to maintain clinical measurements within normal limits will improve Outcome: Completed/Met Goal: Postoperative complications will be avoided or minimized Outcome: Completed/Met

## 2021-01-12 NOTE — Discharge Instructions (Signed)

## 2021-01-13 MED FILL — Heparin Sodium (Porcine) Inj 1000 Unit/ML: INTRAMUSCULAR | Qty: 30 | Status: AC

## 2021-01-13 MED FILL — Sodium Chloride IV Soln 0.9%: INTRAVENOUS | Qty: 1000 | Status: AC

## 2021-01-13 MED FILL — Sodium Chloride Irrigation Soln 0.9%: Qty: 3000 | Status: AC

## 2021-03-15 NOTE — Progress Notes (Signed)
Kyle Riser, MD Reason for referral-thoracic aortic aneurysm   HPI: 65 year old male for evaluation of thoracic aortic aneurysm at request of Kirby Funk, MD. Calcium score April 2022 was 0.  Ascending aorta noted to be dilated at 43 mm.  Patient exercises routinely.  There is no history of dyspnea on exertion, orthopnea, PND, pedal edema, palpitations, syncope or chest pain.  Cardiology now asked to evaluate.  Current Outpatient Medications  Medication Sig Dispense Refill   allopurinol (ZYLOPRIM) 300 MG tablet Take 300 mg by mouth every morning.     lisinopril (PRINIVIL,ZESTRIL) 40 MG tablet Take 40 mg by mouth every morning.     Multiple Vitamin (MULTIVITAMIN WITH MINERALS) TABS tablet Take 1 tablet by mouth daily.     simvastatin (ZOCOR) 40 MG tablet Take 40 mg by mouth daily.     Vitamin D, Ergocalciferol, (DRISDOL) 1.25 MG (50000 UT) CAPS capsule TAKE 1 CAPSULE (50,000 UNITS TOTAL) BY MOUTH EVERY 7 (SEVEN) DAYS. 12 capsule 0   acetaminophen (TYLENOL) 500 MG tablet Take 500-1,000 mg by mouth every 6 (six) hours as needed for moderate pain. (Patient not taking: Reported on 03/19/2021)     cyclobenzaprine (FLEXERIL) 10 MG tablet Take 1 tablet (10 mg total) by mouth 3 (three) times daily as needed for muscle spasms. (Patient not taking: Reported on 03/19/2021) 30 tablet 0   Diclofenac Sodium (PENNSAID) 2 % SOLN Place 1 application onto the skin 2 (two) times daily. (Patient not taking: No sig reported) 112 g 2   gabapentin (NEURONTIN) 300 MG capsule TAKE 1 CAPSULES BY MOUTH 3 TIMES DAILY AS NEEDED (Patient not taking: No sig reported) 90 capsule 2   indomethacin (INDOCIN) 50 MG capsule Take 1 capsule (50 mg total) by mouth 3 (three) times daily with meals. (Patient not taking: Reported on 12/31/2020) 60 capsule 1   naproxen (NAPROSYN) 500 MG tablet Take 1 tablet (500 mg total) by mouth 2 (two) times daily as needed. (Patient not taking: Reported on 03/19/2021) 60 tablet 0    PENNSAID 2 % SOLN APPLY 1 PUMP (1 GRAM) TO AFFECTED AREA TOPICALLY TWICE DAILY AS DIRECTED (Patient not taking: No sig reported) 112 g 2   predniSONE (DELTASONE) 20 MG tablet Take 1 tablet (20 mg total) by mouth 2 (two) times daily. (Patient not taking: No sig reported) 10 tablet 0   predniSONE (DELTASONE) 50 MG tablet Take 1 tablet (50 mg total) by mouth daily. (Patient not taking: No sig reported) 5 tablet 0   predniSONE (DELTASONE) 50 MG tablet 1 tablet by mouth daily (Patient not taking: No sig reported) 5 tablet 0   venlafaxine XR (EFFEXOR XR) 37.5 MG 24 hr capsule Take 1 capsule (37.5 mg total) by mouth daily with breakfast. (Patient not taking: No sig reported) 30 capsule 0   No current facility-administered medications for this visit.    No Known Allergies   Past Medical History:  Diagnosis Date   Arthritis    Gout    History of kidney stones    Hyperlipidemia    Hypertension     Past Surgical History:  Procedure Laterality Date   Anterior Cervical Decompression Fusion  2019   BACK SURGERY     1984 and 1996   DISTAL CLAVICLE EXCISION Right    Right Shoulder Distal Clavicle Excision   JOINT REPLACEMENT     left hip 09/2006   LAMINECTOMY     L4-L5   LAMINECTOMY     L3-L4   SHOULDER SURGERY  TOTAL HIP ARTHROPLASTY  09/14/2011   Procedure: TOTAL HIP ARTHROPLASTY;  Surgeon: Loreta Ave, MD;  Location: [facility] OR;  Service: Orthopedics;  Laterality: Right;    Social History   Socioeconomic History   Marital status: Married    Spouse name: Not on file   Number of children: 2   Years of education: Not on file   Highest education level: Not on file  Occupational History   Not on file  Tobacco Use   Smoking status: Never   Smokeless tobacco: Never  Vaping Use   Vaping Use: Never used  Substance and Sexual Activity   Alcohol use: Yes    Alcohol/week: 8.0 standard drinks    Types: 8 Standard drinks or equivalent per week    Comment: occasionally   Drug use: Yes     Types: Marijuana    Comment: for pain   Sexual activity: Not on file  Other Topics Concern   Not on file  Social History Narrative   Not on file   Social Determinants of Health   Financial Resource Strain: Not on file  Food Insecurity: Not on file  Transportation Needs: Not on file  Physical Activity: Not on file  Stress: Not on file  Social Connections: Not on file  Intimate Partner Violence: Not on file    Family History  Problem Relation Age of Onset   CAD Brother     ROS: Some residual pain from back surgery but no fevers or chills, productive cough, hemoptysis, dysphasia, odynophagia, melena, hematochezia, dysuria, hematuria, rash, seizure activity, orthopnea, PND, pedal edema, claudication. Remaining systems are negative.  Physical Exam:   Blood pressure 118/82, pulse 90, height 5\' 9"  (1.753 m), weight 182 lb 3.2 oz (82.6 kg), SpO2 97 %.  General:  Well developed/well nourished in NAD Skin warm/dry Patient not depressed No peripheral clubbing Back-normal HEENT-normal/normal eyelids Neck supple/normal carotid upstroke bilaterally; no bruits; no JVD; no thyromegaly chest - CTA/ normal expansion CV - RRR/normal S1 and S2; no murmurs, rubs or gallops;  PMI nondisplaced Abdomen -NT/ND, no HSM, no mass, + bowel sounds, no bruit 2+ femoral pulses, no bruits Ext-no edema, chords, 2+ DP Neuro-grossly nonfocal  ECG -Jan 06, 2021-normal sinus rhythm with no ST changes.  Personally reviewed  Electrocardiogram today shows sinus rhythm with occasional PVC, no ST changes.  A/P  1 thoracic aortic aneurysm-we will plan CTA of thoracic aorta April 2023.  I will also arrange a baseline echocardiogram to rule out bicuspid aortic valve.  I will change his lisinopril to losartan.  I have asked him to avoid quinolones.  2 hypertension-patient's blood pressure is controlled.  Given mildly dilated thoracic aorta we will change lisinopril to losartan.  3 hyperlipidemia-most  recent laboratories March 2022 showed total cholesterol 216, HDL 56 and LDL 136.  April 2022, MD

## 2021-03-19 ENCOUNTER — Other Ambulatory Visit: Payer: Self-pay

## 2021-03-19 ENCOUNTER — Ambulatory Visit (INDEPENDENT_AMBULATORY_CARE_PROVIDER_SITE_OTHER): Payer: 59 | Admitting: Cardiology

## 2021-03-19 ENCOUNTER — Encounter: Payer: Self-pay | Admitting: Cardiology

## 2021-03-19 VITALS — BP 118/82 | HR 90 | Ht 69.0 in | Wt 182.2 lb

## 2021-03-19 DIAGNOSIS — I712 Thoracic aortic aneurysm, without rupture, unspecified: Secondary | ICD-10-CM

## 2021-03-19 DIAGNOSIS — E7800 Pure hypercholesterolemia, unspecified: Secondary | ICD-10-CM | POA: Diagnosis not present

## 2021-03-19 DIAGNOSIS — I10 Essential (primary) hypertension: Secondary | ICD-10-CM

## 2021-03-19 MED ORDER — LOSARTAN POTASSIUM 100 MG PO TABS: 100.0000 mg | ORAL_TABLET | Freq: Every day | ORAL | 3 refills | Status: DC

## 2021-03-19 NOTE — Patient Instructions (Signed)
Medication Instructions:   STOP LISINOPRIL  START LOSARTAN 100 MG ONCE DAILY  *If you need a refill on your cardiac medications before your next appointment, please call your pharmacy*  Testing/Procedures:  Your physician has requested that you have an echocardiogram. Echocardiography is a painless test that uses sound waves to create images of your heart. It provides your doctor with information about the size and shape of your heart and how well your heart's chambers and valves are working. This procedure takes approximately one hour. There are no restrictions for this procedure. [address]   Follow-Up: At [facility], you and your health needs are our priority.  As part of our continuing mission to provide you with exceptional heart care, we have created designated Provider Care Teams.  These Care Teams include your primary Cardiologist (physician) and Advanced Practice Providers (APPs -  Physician Assistants and Nurse Practitioners) who all work together to provide you with the care you need, when you need it.  We recommend signing up for the patient portal called "MyChart".  Sign up information is provided on this After Visit Summary.  MyChart is used to connect with patients for Virtual Visits (Telemedicine).  Patients are able to view lab/test results, encounter notes, upcoming appointments, etc.  Non-urgent messages can be sent to your provider as well.   To learn more about what you can do with MyChart, go to [email or web address].    Your next appointment:   12 month(s)  The format for your next appointment:   In Person  Provider:   Olga Millers, MD

## 2021-04-08 ENCOUNTER — Other Ambulatory Visit: Payer: Self-pay

## 2021-04-08 ENCOUNTER — Ambulatory Visit (HOSPITAL_COMMUNITY): Payer: 59 | Attending: Cardiovascular Disease

## 2021-04-08 DIAGNOSIS — I712 Thoracic aortic aneurysm, without rupture, unspecified: Secondary | ICD-10-CM

## 2021-04-08 LAB — ECHOCARDIOGRAM COMPLETE
Area-P 1/2: 3.17 cm2
P 1/2 time: 524 msec
S' Lateral: 3.2 cm

## 2021-04-19 LAB — CBC AND DIFFERENTIAL
HCT: 43 (ref 41–53)
Hemoglobin: 14.3 (ref 13.5–17.5)
Platelets: 236 10*3/uL (ref 150–400)
WBC: 7.8

## 2021-04-19 LAB — BASIC METABOLIC PANEL
BUN: 12 (ref 4–21)
Chloride: 101 (ref 99–108)
Creatinine: 0.8 (ref 0.6–1.3)
Glucose: 91
Potassium: 3.9 mEq/L (ref 3.5–5.1)
Sodium: 138 (ref 137–147)

## 2021-04-19 LAB — COMPREHENSIVE METABOLIC PANEL: Calcium: 9.1 (ref 8.7–10.7)

## 2021-04-19 LAB — LIPID PANEL
Cholesterol: 199 (ref 0–200)
HDL: 49 (ref 35–70)
LDL Cholesterol: 115
Triglycerides: 200 — AB (ref 40–160)

## 2021-04-19 LAB — TSH: TSH: 1.09 (ref 0.41–5.90)

## 2021-04-19 LAB — HEPATIC FUNCTION PANEL
ALT: 26 U/L (ref 10–40)
AST: 23 (ref 14–40)

## 2021-04-19 LAB — CBC: RBC: 4.58 (ref 3.87–5.11)

## 2021-06-29 ENCOUNTER — Other Ambulatory Visit (HOSPITAL_BASED_OUTPATIENT_CLINIC_OR_DEPARTMENT_OTHER): Payer: Self-pay | Admitting: Neurosurgery

## 2021-06-29 ENCOUNTER — Other Ambulatory Visit (HOSPITAL_COMMUNITY): Payer: Self-pay | Admitting: Neurosurgery

## 2021-06-29 DIAGNOSIS — M5440 Lumbago with sciatica, unspecified side: Secondary | ICD-10-CM

## 2021-07-02 ENCOUNTER — Ambulatory Visit (HOSPITAL_COMMUNITY)
Admission: RE | Admit: 2021-07-02 | Discharge: 2021-07-02 | Disposition: A | Discharge status: Home / Self Care | Payer: Medicare Other | Source: Ambulatory Visit | Attending: Neurosurgery | Admitting: Neurosurgery

## 2021-07-02 ENCOUNTER — Other Ambulatory Visit: Payer: Self-pay

## 2021-07-02 DIAGNOSIS — M5440 Lumbago with sciatica, unspecified side: Secondary | ICD-10-CM | POA: Insufficient documentation

## 2021-09-02 ENCOUNTER — Other Ambulatory Visit: Payer: Self-pay | Admitting: Neurosurgery

## 2021-09-02 DIAGNOSIS — M5416 Radiculopathy, lumbar region: Secondary | ICD-10-CM

## 2021-09-08 ENCOUNTER — Ambulatory Visit
Admission: RE | Admit: 2021-09-08 | Discharge: 2021-09-08 | Disposition: A | Discharge status: Home / Self Care | Payer: Medicare Other | Source: Ambulatory Visit | Attending: Neurosurgery | Admitting: Neurosurgery

## 2021-09-08 DIAGNOSIS — M5416 Radiculopathy, lumbar region: Secondary | ICD-10-CM

## 2021-09-15 ENCOUNTER — Other Ambulatory Visit: Payer: Self-pay | Admitting: Neurosurgery

## 2021-09-24 NOTE — Progress Notes (Signed)
Surgical Instructions    Your procedure is scheduled on 09/29/21.  Report to [facility] ([facility] Main Entrance "A" at 5:30 A.M., then check in with the Admitting office.  Call this number if you have problems the morning of surgery:  [phone]   If you have any questions prior to your surgery date call [phone]: Open Monday-Friday 8am-4pm    Remember:  Do not eat or drink after midnight the night before your surgery      Take these medicines the morning of surgery with A SIP OF WATER  allopurinol (ZYLOPRIM) simvastatin (ZOCOR)  IF NEEDED: cyclobenzaprine (FLEXERIL) gabapentin (NEURONTIN)   As of today, STOP taking any Aspirin (unless otherwise instructed by your surgeon) Aleve, Naproxen, Ibuprofen, Motrin, Advil, Goody's, BC's, all herbal medications, fish oil, diclofenac (CATAFLAM) and all vitamins.  After your COVID test   You are not required to quarantine however you are required to wear a well-fitting mask when you are out and around people not in your household.  If your mask becomes wet or soiled, replace with a new one.  Wash your hands often with soap and water for 20 seconds or clean your hands with an alcohol-based hand sanitizer that contains at least 60% alcohol.  Do not share personal items.  Notify your provider: if you are in close contact with someone who has COVID  or if you develop a fever of 100.4 or greater, sneezing, cough, sore throat, shortness of breath or body aches.           Do not wear jewelry or makeup Do not wear lotions, powders, perfumes/colognes, or deodorant. Men may shave face and neck. Do not bring valuables to the hospital. DO Not wear nail polish, gel polish, artificial nails, or any other type of covering on natural nails (fingers and toes) If you have artificial nails or gel coating that need to be removed by a nail salon, please have this removed prior to surgery. Artificial nails or gel coating may interfere with anesthesia's ability  to adequately monitor your vital signs.             [facility] is not responsible for any belongings or valuables.  Do NOT Smoke (Tobacco/Vaping)  24 hours prior to your procedure  If you use a CPAP at night, you may bring your mask for your overnight stay.   Contacts, glasses, hearing aids, dentures or partials may not be worn into surgery, please bring cases for these belongings   For patients admitted to the hospital, discharge time will be determined by your treatment team.   Patients discharged the day of surgery will not be allowed to drive home, and someone needs to stay with them for 24 hours.  NO VISITORS WILL BE ALLOWED IN PRE-OP WHERE PATIENTS ARE PREPPED FOR SURGERY.  ONLY 1 SUPPORT PERSON MAY BE PRESENT IN THE WAITING ROOM WHILE YOU ARE IN SURGERY.  IF YOU ARE TO BE ADMITTED, ONCE YOU ARE IN YOUR ROOM YOU WILL BE ALLOWED TWO (2) VISITORS. 1 (ONE) VISITOR MAY STAY OVERNIGHT BUT MUST ARRIVE TO THE ROOM BY 8pm.  Minor children may have two parents present. Special consideration for safety and communication needs will be reviewed on a case by case basis.  Special instructions:    Oral Hygiene is also important to reduce your risk of infection.  Remember - BRUSH YOUR TEETH THE MORNING OF SURGERY WITH YOUR REGULAR TOOTHPASTE   [facility]- Preparing For Surgery  Before surgery, you can play an  important role. Because skin is not sterile, your skin needs to be as free of germs as possible. You can reduce the number of germs on your skin by washing with CHG (chlorahexidine gluconate) Soap before surgery.  CHG is an antiseptic cleaner which kills germs and bonds with the skin to continue killing germs even after washing.     Please do not use if you have an allergy to CHG or antibacterial soaps. If your skin becomes reddened/irritated stop using the CHG.  Do not shave (including legs and underarms) for at least 48 hours prior to first CHG shower. It is OK to shave your face.  Please  follow these instructions carefully.     Shower the NIGHT BEFORE SURGERY and the MORNING OF SURGERY with CHG Soap.   If you chose to wash your hair, wash your hair first as usual with your normal shampoo. After you shampoo, rinse your hair and body thoroughly to remove the shampoo.  Then [employer] and genitals (private parts) with your normal soap and rinse thoroughly to remove soap.  After that Use CHG Soap as you would any other liquid soap. You can apply CHG directly to the skin and wash gently with a scrungie or a clean washcloth.   Apply the CHG Soap to your body ONLY FROM THE NECK DOWN.  Do not use on open wounds or open sores. Avoid contact with your eyes, ears, mouth and genitals (private parts). Wash Face and genitals (private parts)  with your normal soap.   Wash thoroughly, paying special attention to the area where your surgery will be performed.  Thoroughly rinse your body with warm water from the neck down.  DO NOT shower/wash with your normal soap after using and rinsing off the CHG Soap.  Pat yourself dry with a CLEAN TOWEL.  Wear CLEAN PAJAMAS to bed the night before surgery  Place CLEAN SHEETS on your bed the night before your surgery  DO NOT SLEEP WITH PETS.   Day of Surgery: Take a shower with CHG soap. Wear Clean/Comfortable clothing the morning of surgery Do not apply any deodorants/lotions.   Remember to brush your teeth WITH YOUR REGULAR TOOTHPASTE.   Please read over the following fact sheets that you were given.

## 2021-09-27 ENCOUNTER — Other Ambulatory Visit: Payer: Self-pay

## 2021-09-27 ENCOUNTER — Encounter (HOSPITAL_COMMUNITY)
Admission: RE | Admit: 2021-09-27 | Discharge: 2021-09-27 | Disposition: A | Discharge status: Home / Self Care | Payer: Medicare Other | Source: Ambulatory Visit | Attending: Neurosurgery | Admitting: Neurosurgery

## 2021-09-27 ENCOUNTER — Encounter (HOSPITAL_COMMUNITY): Payer: Self-pay

## 2021-09-27 VITALS — BP 139/84 | HR 92 | Temp 98.9°F | Resp 17 | Ht 69.0 in | Wt 185.7 lb

## 2021-09-27 DIAGNOSIS — Z20822 Contact with and (suspected) exposure to covid-19: Secondary | ICD-10-CM | POA: Insufficient documentation

## 2021-09-27 DIAGNOSIS — I10 Essential (primary) hypertension: Secondary | ICD-10-CM | POA: Insufficient documentation

## 2021-09-27 DIAGNOSIS — Z01812 Encounter for preprocedural laboratory examination: Secondary | ICD-10-CM | POA: Insufficient documentation

## 2021-09-27 DIAGNOSIS — Z01818 Encounter for other preprocedural examination: Secondary | ICD-10-CM

## 2021-09-27 LAB — SURGICAL PCR SCREEN
MRSA, PCR: NEGATIVE
Staphylococcus aureus: NEGATIVE

## 2021-09-27 LAB — COMPREHENSIVE METABOLIC PANEL
ALT: 32 U/L (ref 0–44)
AST: 22 U/L (ref 15–41)
Albumin: 3.8 g/dL (ref 3.5–5.0)
Alkaline Phosphatase: 66 U/L (ref 38–126)
Anion gap: 9 (ref 5–15)
BUN: 14 mg/dL (ref 8–23)
CO2: 27 mmol/L (ref 22–32)
Calcium: 9.3 mg/dL (ref 8.9–10.3)
Chloride: 100 mmol/L (ref 98–111)
Creatinine, Ser: 0.92 mg/dL (ref 0.61–1.24)
GFR, Estimated: >60 mL/min (ref >60)
Glucose, Bld: 105 mg/dL — ABNORMAL HIGH (ref 70–99)
Potassium: 4.7 mmol/L (ref 3.5–5.1)
Sodium: 136 mmol/L (ref 135–145)
Total Bilirubin: 0.6 mg/dL (ref 0.3–1.2)
Total Protein: 6.6 g/dL (ref 6.5–8.1)

## 2021-09-27 LAB — CBC
HCT: 43.2 % (ref 39.0–52.0)
Hemoglobin: 14.8 g/dL (ref 13.0–17.0)
MCH: 33.9 pg (ref 26.0–34.0)
MCHC: 34.3 g/dL (ref 30.0–36.0)
MCV: 99.1 fL (ref 80.0–100.0)
Platelets: 249 10*3/uL (ref 150–400)
RBC: 4.36 MIL/uL (ref 4.22–5.81)
RDW: 12.2 % (ref 11.5–15.5)
WBC: 10 10*3/uL (ref 4.0–10.5)
nRBC: 0 % (ref 0.0–0.2)

## 2021-09-27 LAB — TYPE AND SCREEN
ABO/RH(D): B POS
Antibody Screen: NEGATIVE

## 2021-09-27 LAB — SARS CORONAVIRUS 2 (TAT 6-24 HRS): SARS Coronavirus 2: NEGATIVE

## 2021-09-27 NOTE — Progress Notes (Signed)
PCP - Dr. Linna Darner  Cardiologist - Dr. Phill Mutter- plans for CTA of thoracic aorta April 2023- no need for clearance (per Jeneen Rinks, Anesthesia PA)  PPM/ICD - denies   Chest x-ray - 09/12/11 EKG - 03/19/21 Stress Test - denies ECHO - 04/08/21 Cardiac Cath - denies  Sleep Study - denies   DM- denies  Blood Thinner Instructions: n/a Aspirin Instructions: n/a  ERAS Protcol - no, NPO   COVID TEST- 09/27/21 at PAT   Anesthesia review: no  Patient denies shortness of breath, fever, cough and chest pain at PAT appointment   All instructions explained to the patient, with a verbal understanding of the material. Patient agrees to go over the instructions while at home for a better understanding. Patient also instructed to wear a mask in public after being tested for COVID-19. The opportunity to ask questions was provided.

## 2021-09-28 NOTE — Anesthesia Preprocedure Evaluation (Addendum)
Anesthesia Evaluation  Patient identified by MRN, date of birth, ID band Patient awake    Reviewed: Allergy & Precautions, NPO status , Patient's Chart, lab work & pertinent test results  Airway Mallampati: II  TM Distance: >3 FB     Dental   Pulmonary    breath sounds clear to auscultation       Cardiovascular hypertension,  Rhythm:Regular Rate:Normal  History noted Dr. Chilton Si   Neuro/Psych  Neuromuscular disease    GI/Hepatic negative GI ROS, Neg liver ROS,   Endo/Other  negative endocrine ROS  Renal/GU negative Renal ROS     Musculoskeletal   Abdominal   Peds  Hematology   Anesthesia Other Findings   Reproductive/Obstetrics                            Anesthesia Physical Anesthesia Plan  ASA: 3  Anesthesia Plan: General   Post-op Pain Management:    Induction: Intravenous  PONV Risk Score and Plan: Ondansetron, Dexamethasone and Midazolam  Airway Management Planned: Oral ETT  Additional Equipment:   Intra-op Plan:   Post-operative Plan: Possible Post-op intubation/ventilation  Informed Consent: I have reviewed the patients History and Physical, chart, labs and discussed the procedure including the risks, benefits and alternatives for the proposed anesthesia with the patient or authorized representative who has indicated his/her understanding and acceptance.     Dental advisory given  Plan Discussed with: CRNA and Anesthesiologist  Anesthesia Plan Comments:        Anesthesia Quick Evaluation

## 2021-09-29 ENCOUNTER — Inpatient Hospital Stay (HOSPITAL_COMMUNITY): Payer: Medicare Other | Admitting: Physician Assistant

## 2021-09-29 ENCOUNTER — Other Ambulatory Visit: Payer: Self-pay

## 2021-09-29 ENCOUNTER — Inpatient Hospital Stay (HOSPITAL_COMMUNITY): Payer: Medicare Other | Admitting: Certified Registered Nurse Anesthetist

## 2021-09-29 ENCOUNTER — Inpatient Hospital Stay (HOSPITAL_COMMUNITY)
Admission: RE | Admit: 2021-09-29 | Discharge: 2021-09-30 | DRG: 460 | Disposition: A | Discharge status: Home Health | Payer: Medicare Other | Attending: Neurosurgery | Admitting: Neurosurgery

## 2021-09-29 ENCOUNTER — Encounter (HOSPITAL_COMMUNITY)
Admission: RE | Disposition: A | Discharge status: Home / Self Care | Payer: Self-pay | Source: Home / Self Care | Attending: Neurosurgery

## 2021-09-29 ENCOUNTER — Inpatient Hospital Stay (HOSPITAL_COMMUNITY): Payer: Medicare Other

## 2021-09-29 ENCOUNTER — Encounter (HOSPITAL_COMMUNITY): Payer: Self-pay | Admitting: Neurosurgery

## 2021-09-29 DIAGNOSIS — Z981 Arthrodesis status: Secondary | ICD-10-CM

## 2021-09-29 DIAGNOSIS — Z20822 Contact with and (suspected) exposure to covid-19: Secondary | ICD-10-CM | POA: Diagnosis present

## 2021-09-29 DIAGNOSIS — M48061 Spinal stenosis, lumbar region without neurogenic claudication: Secondary | ICD-10-CM | POA: Diagnosis present

## 2021-09-29 DIAGNOSIS — I10 Essential (primary) hypertension: Secondary | ICD-10-CM | POA: Diagnosis present

## 2021-09-29 DIAGNOSIS — E785 Hyperlipidemia, unspecified: Secondary | ICD-10-CM | POA: Diagnosis present

## 2021-09-29 DIAGNOSIS — M109 Gout, unspecified: Secondary | ICD-10-CM | POA: Diagnosis present

## 2021-09-29 DIAGNOSIS — Z96642 Presence of left artificial hip joint: Secondary | ICD-10-CM | POA: Diagnosis present

## 2021-09-29 DIAGNOSIS — M5136 Other intervertebral disc degeneration, lumbar region: Secondary | ICD-10-CM | POA: Diagnosis present

## 2021-09-29 DIAGNOSIS — M1990 Unspecified osteoarthritis, unspecified site: Secondary | ICD-10-CM | POA: Diagnosis present

## 2021-09-29 DIAGNOSIS — Z8249 Family history of ischemic heart disease and other diseases of the circulatory system: Secondary | ICD-10-CM | POA: Diagnosis not present

## 2021-09-29 DIAGNOSIS — M5126 Other intervertebral disc displacement, lumbar region: Secondary | ICD-10-CM | POA: Diagnosis present

## 2021-09-29 DIAGNOSIS — Z419 Encounter for procedure for purposes other than remedying health state, unspecified: Secondary | ICD-10-CM

## 2021-09-29 DIAGNOSIS — I712 Thoracic aortic aneurysm, without rupture, unspecified: Secondary | ICD-10-CM | POA: Diagnosis present

## 2021-09-29 HISTORY — PX: LUMBAR FUSION: SHX111

## 2021-09-29 SURGERY — POSTERIOR LUMBAR FUSION 1 WITH HARDWARE REMOVAL
Anesthesia: General | Site: Back

## 2021-09-29 MED ORDER — THROMBIN 5000 UNITS EX SOLR
CUTANEOUS | Status: AC
  Filled 2021-09-29: qty 5000

## 2021-09-29 MED ORDER — ADULT MULTIVITAMIN W/MINERALS CH
1.0000 | ORAL_TABLET | Freq: Every day | ORAL | Status: DC
  Administered 2021-09-29: 16:00:00 1 via ORAL
  Filled 2021-09-29: qty 1

## 2021-09-29 MED ORDER — OXYCODONE HCL 5 MG PO TABS
10.0000 mg | ORAL_TABLET | ORAL | Status: DC | PRN
  Administered 2021-09-29 (×3): 5 mg via ORAL
  Administered 2021-09-30: 10 mg via ORAL
  Filled 2021-09-29 (×4): qty 2

## 2021-09-29 MED ORDER — 0.9 % SODIUM CHLORIDE (POUR BTL) OPTIME
TOPICAL | Status: DC | PRN
Start: 2021-09-29 — End: 2021-09-29
  Administered 2021-09-29 (×3): 1000 mL

## 2021-09-29 MED ORDER — FENTANYL CITRATE (PF) 250 MCG/5ML IJ SOLN
INTRAMUSCULAR | Status: AC
  Filled 2021-09-29: qty 5

## 2021-09-29 MED ORDER — ONDANSETRON HCL 4 MG/2ML IJ SOLN: 4.0000 mg | Freq: Four times a day (QID) | INTRAMUSCULAR | Status: DC | PRN

## 2021-09-29 MED ORDER — LIDOCAINE-EPINEPHRINE 1 %-1:100000 IJ SOLN
INTRAMUSCULAR | Status: DC | PRN
  Administered 2021-09-29: 10 mL

## 2021-09-29 MED ORDER — MIDAZOLAM HCL 5 MG/5ML IJ SOLN
INTRAMUSCULAR | Status: DC | PRN
  Administered 2021-09-29: 2 mg via INTRAVENOUS

## 2021-09-29 MED ORDER — DICLOFENAC SODIUM 25 MG PO TBEC
50.0000 mg | DELAYED_RELEASE_TABLET | Freq: Two times a day (BID) | ORAL | Status: DC | PRN
  Filled 2021-09-29: qty 2

## 2021-09-29 MED ORDER — FENTANYL CITRATE (PF) 250 MCG/5ML IJ SOLN
INTRAMUSCULAR | Status: DC | PRN
  Administered 2021-09-29: 150 ug via INTRAVENOUS
  Administered 2021-09-29 (×2): 50 ug via INTRAVENOUS

## 2021-09-29 MED ORDER — PANTOPRAZOLE SODIUM 40 MG IV SOLR: 40.0000 mg | Freq: Every day | INTRAVENOUS | Status: DC

## 2021-09-29 MED ORDER — LOSARTAN POTASSIUM 50 MG PO TABS
100.0000 mg | ORAL_TABLET | Freq: Every day | ORAL | Status: DC
Start: 2021-09-29 — End: 2021-09-30
  Administered 2021-09-29: 16:00:00 100 mg via ORAL
  Filled 2021-09-29: qty 2

## 2021-09-29 MED ORDER — THROMBIN 20000 UNITS EX SOLR: CUTANEOUS | Status: DC | PRN

## 2021-09-29 MED ORDER — ALLOPURINOL 300 MG PO TABS
300.0000 mg | ORAL_TABLET | Freq: Every day | ORAL | Status: DC
  Filled 2021-09-29: qty 1

## 2021-09-29 MED ORDER — SIMVASTATIN 20 MG PO TABS: 40.0000 mg | ORAL_TABLET | Freq: Every day | ORAL | Status: DC

## 2021-09-29 MED ORDER — HYDROMORPHONE HCL 1 MG/ML IJ SOLN: 0.5000 mg | INTRAMUSCULAR | Status: DC | PRN

## 2021-09-29 MED ORDER — SODIUM CHLORIDE 0.9 % IV SOLN: 250.0000 mL | INTRAVENOUS | Status: DC

## 2021-09-29 MED ORDER — CEFAZOLIN SODIUM-DEXTROSE 2-4 GM/100ML-% IV SOLN
2.0000 g | Freq: Three times a day (TID) | INTRAVENOUS | Status: DC
  Administered 2021-09-29 (×2): 2 g via INTRAVENOUS
  Filled 2021-09-29 (×2): qty 100

## 2021-09-29 MED ORDER — SUGAMMADEX SODIUM 200 MG/2ML IV SOLN
INTRAVENOUS | Status: DC | PRN
  Administered 2021-09-29: 200 mg via INTRAVENOUS

## 2021-09-29 MED ORDER — THROMBIN 20000 UNITS EX SOLR
CUTANEOUS | Status: AC
  Filled 2021-09-29: qty 20000

## 2021-09-29 MED ORDER — LIDOCAINE 2% (20 MG/ML) 5 ML SYRINGE
INTRAMUSCULAR | Status: AC
  Filled 2021-09-29: qty 5

## 2021-09-29 MED ORDER — ONDANSETRON HCL 4 MG/2ML IJ SOLN
INTRAMUSCULAR | Status: DC | PRN
Start: 2021-09-29 — End: 2021-09-29
  Administered 2021-09-29: 4 mg via INTRAVENOUS

## 2021-09-29 MED ORDER — ROCURONIUM BROMIDE 10 MG/ML (PF) SYRINGE
PREFILLED_SYRINGE | INTRAVENOUS | Status: AC
  Filled 2021-09-29: qty 20

## 2021-09-29 MED ORDER — BUPIVACAINE LIPOSOME 1.3 % IJ SUSP
INTRAMUSCULAR | Status: DC | PRN
  Administered 2021-09-29: 20 mL

## 2021-09-29 MED ORDER — FENTANYL CITRATE (PF) 100 MCG/2ML IJ SOLN
25.0000 ug | INTRAMUSCULAR | Status: DC | PRN
  Administered 2021-09-29 (×2): 50 ug via INTRAVENOUS

## 2021-09-29 MED ORDER — LACTATED RINGERS IV SOLN: INTRAVENOUS | Status: DC | PRN

## 2021-09-29 MED ORDER — PHENYLEPHRINE 40 MCG/ML (10ML) SYRINGE FOR IV PUSH (FOR BLOOD PRESSURE SUPPORT)
PREFILLED_SYRINGE | INTRAVENOUS | Status: AC
  Filled 2021-09-29: qty 10

## 2021-09-29 MED ORDER — LACTATED RINGERS IV SOLN: INTRAVENOUS | Status: DC

## 2021-09-29 MED ORDER — ACETAMINOPHEN 10 MG/ML IV SOLN
INTRAVENOUS | Status: AC
  Filled 2021-09-29: qty 100

## 2021-09-29 MED ORDER — ONDANSETRON HCL 4 MG PO TABS: 4.0000 mg | ORAL_TABLET | Freq: Four times a day (QID) | ORAL | Status: DC | PRN

## 2021-09-29 MED ORDER — ROCURONIUM BROMIDE 10 MG/ML (PF) SYRINGE
PREFILLED_SYRINGE | INTRAVENOUS | Status: DC | PRN
Start: 2021-09-29 — End: 2021-09-29
  Administered 2021-09-29: 60 mg via INTRAVENOUS
  Administered 2021-09-29 (×3): 20 mg via INTRAVENOUS

## 2021-09-29 MED ORDER — LIDOCAINE 2% (20 MG/ML) 5 ML SYRINGE
INTRAMUSCULAR | Status: DC | PRN
  Administered 2021-09-29: 80 mg via INTRAVENOUS

## 2021-09-29 MED ORDER — CEFAZOLIN SODIUM-DEXTROSE 2-4 GM/100ML-% IV SOLN
INTRAVENOUS | Status: AC
  Filled 2021-09-29: qty 100

## 2021-09-29 MED ORDER — DEXAMETHASONE SODIUM PHOSPHATE 10 MG/ML IJ SOLN
INTRAMUSCULAR | Status: AC
  Filled 2021-09-29: qty 1

## 2021-09-29 MED ORDER — PHENYLEPHRINE HCL-NACL 20-0.9 MG/250ML-% IV SOLN
INTRAVENOUS | Status: DC | PRN
  Administered 2021-09-29: 15 ug/min via INTRAVENOUS

## 2021-09-29 MED ORDER — PROPOFOL 10 MG/ML IV BOLUS
INTRAVENOUS | Status: AC
  Filled 2021-09-29: qty 20

## 2021-09-29 MED ORDER — CYCLOBENZAPRINE HCL 10 MG PO TABS: 10.0000 mg | ORAL_TABLET | Freq: Every day | ORAL | Status: DC | PRN

## 2021-09-29 MED ORDER — LIDOCAINE-EPINEPHRINE 1 %-1:100000 IJ SOLN
INTRAMUSCULAR | Status: AC
  Filled 2021-09-29: qty 1

## 2021-09-29 MED ORDER — PANTOPRAZOLE SODIUM 40 MG PO TBEC
40.0000 mg | DELAYED_RELEASE_TABLET | Freq: Every day | ORAL | Status: DC
  Administered 2021-09-29: 22:00:00 40 mg via ORAL
  Filled 2021-09-29: qty 1

## 2021-09-29 MED ORDER — ALBUMIN HUMAN 5 % IV SOLN: INTRAVENOUS | Status: DC | PRN

## 2021-09-29 MED ORDER — BUPIVACAINE LIPOSOME 1.3 % IJ SUSP
INTRAMUSCULAR | Status: AC
  Filled 2021-09-29: qty 20

## 2021-09-29 MED ORDER — GABAPENTIN 300 MG PO CAPS: 300.0000 mg | ORAL_CAPSULE | Freq: Every day | ORAL | Status: DC | PRN

## 2021-09-29 MED ORDER — SODIUM CHLORIDE 0.9% FLUSH: 3.0000 mL | INTRAVENOUS | Status: DC | PRN

## 2021-09-29 MED ORDER — DEXAMETHASONE SODIUM PHOSPHATE 10 MG/ML IJ SOLN
INTRAMUSCULAR | Status: DC | PRN
  Administered 2021-09-29: 10 mg via INTRAVENOUS

## 2021-09-29 MED ORDER — MENTHOL 3 MG MT LOZG: 1.0000 | LOZENGE | OROMUCOSAL | Status: DC | PRN

## 2021-09-29 MED ORDER — SODIUM CHLORIDE 0.9% FLUSH: 3.0000 mL | Freq: Two times a day (BID) | INTRAVENOUS | Status: DC

## 2021-09-29 MED ORDER — ALUM & MAG HYDROXIDE-SIMETH 200-200-20 MG/5ML PO SUSP: 30.0000 mL | Freq: Four times a day (QID) | ORAL | Status: DC | PRN

## 2021-09-29 MED ORDER — CHLORHEXIDINE GLUCONATE 0.12 % MT SOLN
OROMUCOSAL | Status: AC
  Administered 2021-09-29: 06:00:00 15 mL via OROMUCOSAL
  Filled 2021-09-29: qty 15

## 2021-09-29 MED ORDER — ACETAMINOPHEN 650 MG RE SUPP: 650.0000 mg | RECTAL | Status: DC | PRN

## 2021-09-29 MED ORDER — CHLORHEXIDINE GLUCONATE 0.12 % MT SOLN: 15.0000 mL | Freq: Once | OROMUCOSAL | Status: AC

## 2021-09-29 MED ORDER — ACETAMINOPHEN 325 MG PO TABS
650.0000 mg | ORAL_TABLET | ORAL | Status: DC | PRN
  Administered 2021-09-29 – 2021-09-30 (×2): 650 mg via ORAL
  Filled 2021-09-29 (×2): qty 2

## 2021-09-29 MED ORDER — FENTANYL CITRATE (PF) 100 MCG/2ML IJ SOLN
INTRAMUSCULAR | Status: AC
  Filled 2021-09-29: qty 2

## 2021-09-29 MED ORDER — ACETAMINOPHEN 10 MG/ML IV SOLN
INTRAVENOUS | Status: DC | PRN
  Administered 2021-09-29: 1000 mg via INTRAVENOUS

## 2021-09-29 MED ORDER — CYCLOBENZAPRINE HCL 10 MG PO TABS
10.0000 mg | ORAL_TABLET | Freq: Three times a day (TID) | ORAL | Status: DC | PRN
  Administered 2021-09-29: 22:00:00 10 mg via ORAL
  Filled 2021-09-29: qty 1

## 2021-09-29 MED ORDER — PROPOFOL 10 MG/ML IV BOLUS
INTRAVENOUS | Status: DC | PRN
  Administered 2021-09-29: 170 mg via INTRAVENOUS

## 2021-09-29 MED ORDER — MIDAZOLAM HCL 2 MG/2ML IJ SOLN
INTRAMUSCULAR | Status: AC
  Filled 2021-09-29: qty 2

## 2021-09-29 MED ORDER — CEFAZOLIN SODIUM-DEXTROSE 2-3 GM-%(50ML) IV SOLR
INTRAVENOUS | Status: DC | PRN
  Administered 2021-09-29: 2 g via INTRAVENOUS

## 2021-09-29 MED ORDER — PHENOL 1.4 % MT LIQD: 1.0000 | OROMUCOSAL | Status: DC | PRN

## 2021-09-29 MED ORDER — ORAL CARE MOUTH RINSE: 15.0000 mL | Freq: Once | OROMUCOSAL | Status: AC

## 2021-09-29 SURGICAL SUPPLY — 79 items
ADH SKN CLS APL DERMABOND .7 (GAUZE/BANDAGES/DRESSINGS) ×1
APL SKNCLS STERI-STRIP NONHPOA (GAUZE/BANDAGES/DRESSINGS) ×1
BAG COUNTER SPONGE SURGICOUNT (BAG) ×3 IMPLANT
BAG SPNG CNTER NS LX DISP (BAG) ×1
BASKET BONE COLLECTION (BASKET) ×3 IMPLANT
BENZOIN TINCTURE PRP APPL 2/3 (GAUZE/BANDAGES/DRESSINGS) ×3 IMPLANT
BLADE CLIPPER SURG (BLADE) ×1 IMPLANT
BLADE SURG 11 STRL SS (BLADE) ×3 IMPLANT
BONE VIVIGEN FORMABLE 5.4CC (Bone Implant) ×2 IMPLANT
BUR CUTTER 7.0 ROUND (BURR) ×3 IMPLANT
BUR MATCHSTICK NEURO 3.0 LAGG (BURR) ×3 IMPLANT
CANISTER SUCT 3000ML PPV (MISCELLANEOUS) ×3 IMPLANT
CAP LOCKING THREADED (Cap) ×8 IMPLANT
CARTRIDGE OIL MAESTRO DRILL (MISCELLANEOUS) ×2 IMPLANT
CNTNR URN SCR LID CUP LEK RST (MISCELLANEOUS) ×2 IMPLANT
CONT SPEC 4OZ STRL OR WHT (MISCELLANEOUS) ×2
COVER BACK TABLE 60X90IN (DRAPES) ×3 IMPLANT
DECANTER SPIKE VIAL GLASS SM (MISCELLANEOUS) ×2 IMPLANT
DERMABOND ADVANCED (GAUZE/BANDAGES/DRESSINGS) ×1
DERMABOND ADVANCED .7 DNX12 (GAUZE/BANDAGES/DRESSINGS) ×2 IMPLANT
DIFFUSER DRILL AIR PNEUMATIC (MISCELLANEOUS) ×3 IMPLANT
DRAPE C-ARM 42X72 X-RAY (DRAPES) ×6 IMPLANT
DRAPE C-ARMOR (DRAPES) IMPLANT
DRAPE HALF SHEET 40X57 (DRAPES) IMPLANT
DRAPE LAPAROTOMY 100X72X124 (DRAPES) ×3 IMPLANT
DRAPE SURG 17X23 STRL (DRAPES) ×2 IMPLANT
DRSG OPSITE 4X5.5 SM (GAUZE/BANDAGES/DRESSINGS) ×3 IMPLANT
DRSG OPSITE POSTOP 4X6 (GAUZE/BANDAGES/DRESSINGS) ×2 IMPLANT
DRSG OPSITE POSTOP 4X8 (GAUZE/BANDAGES/DRESSINGS) ×1 IMPLANT
DURAPREP 26ML APPLICATOR (WOUND CARE) ×3 IMPLANT
ELECT REM PT RETURN 9FT ADLT (ELECTROSURGICAL) ×2
ELECTRODE REM PT RTRN 9FT ADLT (ELECTROSURGICAL) ×2 IMPLANT
EVACUATOR 1/8 PVC DRAIN (DRAIN) ×1 IMPLANT
EVACUATOR 3/16  PVC DRAIN (DRAIN)
EVACUATOR 3/16 PVC DRAIN (DRAIN) ×2 IMPLANT
GAUZE 4X4 16PLY ~~LOC~~+RFID DBL (SPONGE) ×1 IMPLANT
GAUZE SPONGE 4X4 12PLY STRL (GAUZE/BANDAGES/DRESSINGS) ×2 IMPLANT
GAUZE SPONGE 4X4 12PLY STRL LF (GAUZE/BANDAGES/DRESSINGS) ×1 IMPLANT
GLOVE EXAM NITRILE XL STR (GLOVE) IMPLANT
GLOVE SURG ENC MOIS LTX SZ7 (GLOVE) ×1 IMPLANT
GLOVE SURG ENC MOIS LTX SZ8 (GLOVE) ×6 IMPLANT
GLOVE SURG UNDER POLY LF SZ7 (GLOVE) ×1 IMPLANT
GLOVE SURG UNDER POLY LF SZ8.5 (GLOVE) ×6 IMPLANT
GOWN STRL REUS W/ TWL LRG LVL3 (GOWN DISPOSABLE) IMPLANT
GOWN STRL REUS W/ TWL XL LVL3 (GOWN DISPOSABLE) ×4 IMPLANT
GOWN STRL REUS W/TWL 2XL LVL3 (GOWN DISPOSABLE) IMPLANT
GOWN STRL REUS W/TWL LRG LVL3 (GOWN DISPOSABLE) ×4
GOWN STRL REUS W/TWL XL LVL3 (GOWN DISPOSABLE) ×4
GRAFT BNE MATRIX VG FRMBL MD 5 (Bone Implant) IMPLANT
GRAFT BONE PROTEIOS LRG 5CC (Orthopedic Implant) ×1 IMPLANT
KIT BASIN OR (CUSTOM PROCEDURE TRAY) ×3 IMPLANT
KIT GRAFTMAG DEL NEURO DISP (NEUROSURGERY SUPPLIES) IMPLANT
KIT TURNOVER KIT B (KITS) ×3 IMPLANT
MILL MEDIUM DISP (BLADE) ×3 IMPLANT
NDL HYPO 21X1.5 SAFETY (NEEDLE) ×2 IMPLANT
NDL HYPO 25X1 1.5 SAFETY (NEEDLE) ×2 IMPLANT
NEEDLE HYPO 21X1.5 SAFETY (NEEDLE) ×2 IMPLANT
NEEDLE HYPO 25X1 1.5 SAFETY (NEEDLE) ×2 IMPLANT
NS IRRIG 1000ML POUR BTL (IV SOLUTION) ×3 IMPLANT
OIL CARTRIDGE MAESTRO DRILL (MISCELLANEOUS) ×2
PACK LAMINECTOMY NEURO (CUSTOM PROCEDURE TRAY) ×3 IMPLANT
PAD ARMBOARD 7.5X6 YLW CONV (MISCELLANEOUS) ×10 IMPLANT
ROD 100MM (Rod) ×4 IMPLANT
ROD SPNL STRG 100X5.5XNS (Rod) IMPLANT
SCREW PA THRD CREO TULIP 5.5X4 (Head) ×2 IMPLANT
SHAFT CREO 30MM (Neuro Prosthesis/Implant) ×2 IMPLANT
SPACER SUSTAIN RT 12 8D 9X26 (Spacer) ×2 IMPLANT
SPONGE SURGIFOAM ABS GEL 100 (HEMOSTASIS) ×3 IMPLANT
SPONGE T-LAP 4X18 ~~LOC~~+RFID (SPONGE) ×2 IMPLANT
STRIP CLOSURE SKIN 1/2X4 (GAUZE/BANDAGES/DRESSINGS) ×5 IMPLANT
SUT VIC AB 0 CT1 18XCR BRD8 (SUTURE) ×4 IMPLANT
SUT VIC AB 0 CT1 8-18 (SUTURE) ×2
SUT VIC AB 2-0 CT1 18 (SUTURE) ×3 IMPLANT
SUT VIC AB 4-0 PS2 27 (SUTURE) ×3 IMPLANT
SYR 20ML LL LF (SYRINGE) ×1 IMPLANT
TOWEL GREEN STERILE (TOWEL DISPOSABLE) ×3 IMPLANT
TOWEL GREEN STERILE FF (TOWEL DISPOSABLE) ×3 IMPLANT
TRAY FOLEY MTR SLVR 16FR STAT (SET/KITS/TRAYS/PACK) ×3 IMPLANT
WATER STERILE IRR 1000ML POUR (IV SOLUTION) ×3 IMPLANT

## 2021-09-29 NOTE — H&P (Signed)
Kyle DunningJohn Lawson is an 66 y.o. male.   Chief Complaint: Back bilateral hip and leg pain HPI: 66 year old gentleman with history of previous L3 L5 fusion presents with progressive worsening back and bilateral hip and leg pain rating down L3 nerve root pattern.  Work-up revealed a large disc herniation at L2-3.  Due to the patient's progression of clinical syndrome imaging findings and failed conservative treatment I recommended decompressive laminectomy interbody fusion at L2-3 including removal of hardware and extension from his previous fusion L3 L5.  I have gone over the risks and benefits of this perioperative course expectations of outcome and alternatives of surgery and he understands and agrees to proceed forward.  Past Medical History:  Diagnosis Date   Arthritis    Gout    History of kidney stones    Hyperlipidemia    Hypertension    Thoracic aortic aneurysm 2022   plans for new CTA of thoracic aorta April 2023    Past Surgical History:  Procedure Laterality Date   Anterior Cervical Decompression Fusion  2019   DISTAL CLAVICLE EXCISION Right 2020   Right Shoulder Distal Clavicle Excision   LAMINECTOMY  1984   L4-L5   LAMINECTOMY  1996   L3-L4   TOTAL HIP ARTHROPLASTY Left    left hip 09/2006   TOTAL HIP ARTHROPLASTY  09/14/2011   Procedure: TOTAL HIP ARTHROPLASTY;  Surgeon: Loreta Aveaniel F Murphy, MD;  Location: [facility] OR;  Service: Orthopedics;  Laterality: Right;    Family History  Problem Relation Age of Onset   CAD Brother    Social History:  reports that he has never smoked. He has never used smokeless tobacco. He reports that he does not currently use alcohol after a past usage of about 4.0 - 5.0 standard drinks per week. He reports current drug use. Drug: Marijuana.  Allergies: No Known Allergies  Medications Prior to Admission  Medication Sig Dispense Refill   allopurinol (ZYLOPRIM) 300 MG tablet Take 300 mg by mouth every morning.     cyclobenzaprine (FLEXERIL) 10 MG tablet  Take 1 tablet (10 mg total) by mouth 3 (three) times daily as needed for muscle spasms. (Patient taking differently: Take 10 mg by mouth daily as needed for muscle spasms.) 30 tablet 0   diclofenac (CATAFLAM) 50 MG tablet Take 50 mg by mouth 2 (two) times daily as needed (pain).     gabapentin (NEURONTIN) 300 MG capsule TAKE 1 CAPSULES BY MOUTH 3 TIMES DAILY AS NEEDED (Patient taking differently: Take 300 mg by mouth daily as needed (pain).) 90 capsule 2   losartan (COZAAR) 100 MG tablet Take 1 tablet (100 mg total) by mouth daily. 90 tablet 3   Multiple Vitamin (MULTIVITAMIN WITH MINERALS) TABS tablet Take 1 tablet by mouth daily.     simvastatin (ZOCOR) 40 MG tablet Take 40 mg by mouth daily.      Results for orders placed or performed during the hospital encounter of 09/27/21 (from the past 48 hour(s))  SARS CORONAVIRUS 2 (TAT 6-24 HRS) Nasopharyngeal Nasopharyngeal Swab     Status: None   Collection Time: 09/27/21  9:38 AM   Specimen: Nasopharyngeal Swab  Result Value Ref Range   SARS Coronavirus 2 NEGATIVE NEGATIVE    Comment: (NOTE) SARS-CoV-2 target nucleic acids are NOT DETECTED.  The SARS-CoV-2 RNA is generally detectable in upper and lower respiratory specimens during the acute phase of infection. Negative results do not preclude SARS-CoV-2 infection, do not rule out co-infections with other pathogens, and should  not be used as the sole basis for treatment or other patient management decisions. Negative results must be combined with clinical observations, patient history, and epidemiological information. The expected result is Negative.  Fact Sheet for Patients: HairSlick.no  Fact Sheet for Healthcare Providers: quierodirigir.com  This test is not yet approved or cleared by the [place] FDA and  has been authorized for detection and/or diagnosis of SARS-CoV-2 by FDA under an Emergency Use Authorization (EUA). This  EUA will remain  in effect (meaning this test can be used) for the duration of the COVID-19 declaration under Se ction 564(b)(1) of the Act, 21 U.S.C. section 360bbb-3(b)(1), unless the authorization is terminated or revoked sooner.  Performed at [facility], [address], [place], [place] [address]   Comprehensive metabolic panel per protocol     Status: Abnormal   Collection Time: 09/27/21  9:48 AM  Result Value Ref Range   Sodium 136 135 - 145 mmol/L   Potassium 4.7 3.5 - 5.1 mmol/L   Chloride 100 98 - 111 mmol/L   CO2 27 22 - 32 mmol/L   Glucose, Bld 105 (H) 70 - 99 mg/dL    Comment: Glucose reference range applies only to samples taken after fasting for at least 8 hours.   BUN 14 8 - 23 mg/dL   Creatinine, Ser 5.39 0.61 - 1.24 mg/dL   Calcium 9.3 8.9 - 76.7 mg/dL   Total Protein 6.6 6.5 - 8.1 g/dL   Albumin 3.8 3.5 - 5.0 g/dL   AST 22 15 - 41 U/L   ALT 32 0 - 44 U/L   Alkaline Phosphatase 66 38 - 126 U/L   Total Bilirubin 0.6 0.3 - 1.2 mg/dL   GFR, Estimated >34 >19 mL/min    Comment: (NOTE) Calculated using the CKD-EPI Creatinine Equation (2021)    Anion gap 9 5 - 15    Comment: Performed at [facility], [address] [place], [place] [address]  CBC per protocol     Status: None   Collection Time: 09/27/21  9:48 AM  Result Value Ref Range   WBC 10.0 4.0 - 10.5 K/uL   RBC 4.36 4.22 - 5.81 MIL/uL   Hemoglobin 14.8 13.0 - 17.0 g/dL   HCT 40.9 73.5 - 32.9 %   MCV 99.1 80.0 - 100.0 fL   MCH 33.9 26.0 - 34.0 pg   MCHC 34.3 30.0 - 36.0 g/dL   RDW 92.4 26.8 - 34.1 %   Platelets 249 150 - 400 K/uL   nRBC 0.0 0.0 - 0.2 %    Comment: Performed at [facility], [address], [place], [place] [address]  Surgical pcr screen     Status: None   Collection Time: 09/27/21  9:48 AM   Specimen: Nasal Mucosa; Nasal Swab  Result Value Ref Range   MRSA, PCR NEGATIVE NEGATIVE   Staphylococcus aureus NEGATIVE NEGATIVE    Comment: (NOTE) The Xpert SA Assay  (FDA approved for NASAL specimens in patients 87 years of age and older), is one component of a comprehensive surveillance program. It is not intended to diagnose infection nor to guide or monitor treatment. Performed at [facility], [address], [place], [place] [address]   Type and screen [facility]     Status: None   Collection Time: 09/27/21  9:54 AM  Result Value Ref Range   ABO/RH(D) B POS    Antibody Screen NEG  Sample Expiration 10/11/2021,2359    Extend sample reason      NO TRANSFUSIONS OR PREGNANCY IN THE PAST 3 MONTHS Performed at [facility], [address], [place], [place] [address]    No results found.  Review of Systems  Musculoskeletal:  Positive for back pain.  Neurological:  Positive for numbness.   Blood pressure 129/82, pulse 80, temperature 97.8 F (36.6 C), temperature source Oral, resp. rate 17, height 5\' 9"  (1.753 m), weight 84.2 kg, SpO2 98 %. Physical Exam HENT:     Head: Normocephalic.     Right Ear: Tympanic membrane normal.     Nose: Nose normal.  Eyes:     Pupils: Pupils are equal, round, and reactive to light.  Cardiovascular:     Rate and Rhythm: Normal rate.  Pulmonary:     Effort: Pulmonary effort is normal.  Abdominal:     General: Abdomen is flat.  Musculoskeletal:        General: Normal range of motion.     Cervical back: Normal range of motion.  Skin:    General: Skin is warm.  Neurological:     Mental Status: He is alert.     Comments: Strength is 5 out of 5 iliopsoas, quads, hamstrings, gastrocs, into tibialis, and EHL.     Assessment/Plan 66 year old presents for posterior lumbar interbody fusion L2-3 with extension of fusion removal of hardware L3-L5  76, MD 09/29/2021, 7:34 AM

## 2021-09-29 NOTE — Anesthesia Procedure Notes (Signed)
Procedure Name: Intubation Date/Time: 09/29/2021 7:50 AM Performed by: Glynda Jaeger, CRNA Pre-anesthesia Checklist: Patient identified, Patient being monitored, Timeout performed, Emergency Drugs available and Suction available Patient Re-evaluated:Patient Re-evaluated prior to induction Oxygen Delivery Method: Circle System Utilized Preoxygenation: Pre-oxygenation with 100% oxygen Induction Type: IV induction Ventilation: Mask ventilation without difficulty Laryngoscope Size: Mac and 4 Grade View: Grade I Tube type: Oral Tube size: 7.5 mm Number of attempts: 1 Airway Equipment and Method: Stylet Placement Confirmation: ETT inserted through vocal cords under direct vision, positive ETCO2 and breath sounds checked- equal and bilateral Secured at: 23 cm Tube secured with: Tape Dental Injury: Teeth and Oropharynx as per pre-operative assessment

## 2021-09-29 NOTE — Transfer of Care (Signed)
Immediate Anesthesia Transfer of Care Note  Patient: Kyle Lawson  Procedure(s) Performed: Posterior Lumbar Interbody Fusion Lumbar two-three, removal of hardware Lumbar three-five  (Back)  Patient Location: PACU  Anesthesia Type:General  Level of Consciousness: awake, alert  and oriented  Airway & Oxygen Therapy: Patient Spontanous Breathing  Post-op Assessment: Report given to RN, Post -op Vital signs reviewed and stable and Patient moving all extremities X 4  Post vital signs: Reviewed and stable  Last Vitals:  Vitals Value Taken Time  BP 131/85 09/29/21 1046  Temp    Pulse 96 09/29/21 1049  Resp 15 09/29/21 1049  SpO2 97 % 09/29/21 1049  Vitals shown include unvalidated device data.  Last Pain:  Vitals:   09/29/21 0610  TempSrc:   PainSc: 4       Patients Stated Pain Goal: 3 ([id number])  Complications: No notable events documented.

## 2021-09-29 NOTE — TOC Initial Note (Signed)
Transition of Care [facility]) - Initial/Assessment Note    Patient Details  Name: Kyle Lawson MRN: [id number] Date of Birth: 1955/12/19  [facility]) CM/SW Contact:    Lawerance Sabal, RN Phone Number: 09/29/2021, 3:23 PM  Clinical Narrative:         Notified by Darlin Coco that patient is pre-assigned by outpatient office for [facility] services. Unit staff will supply any DME needed through floor stock.           Expected Discharge Plan: Home w Home Health Services     Patient Goals and CMS Choice        Expected Discharge Plan and Services Expected Discharge Plan: Home w Home Health Services                                     [facility] Agency: [facility] Date [facility] Agency Contacted: 09/29/21 Time HH Agency Contacted: [phone] Representative spoke with at [facility] Agency: Amy  Prior Living Arrangements/Services                       Activities of Daily Living      Permission Sought/Granted                  Emotional Assessment              Admission diagnosis:  Spinal stenosis of lumbar region [M48.061] Patient Active Problem List   Diagnosis Date Noted   Spinal stenosis of lumbar region 09/29/2021   HNP (herniated nucleus pulposus), lumbar 01/11/2021   Acromioclavicular joint pain 07/13/2019   Sternum pain 01/16/2019   Right anterior shoulder pain 11/08/2017   Neck pain 07/04/2017   Gout 02/05/2017   Lumbar spondylosis 01/19/2017   Acute bilateral low back pain with bilateral sciatica 01/19/2017   PCP:  [name], MD Pharmacy:   [id number] [place], [place] - [address] [place] [address] Phone: [phone] Fax: [phone]  [facility] [place], [place], [place] - [address] [place] [address] Phone: [phone] Fax: [phone]     Social Determinants of Health (SDOH) Interventions    Readmission Risk Interventions No flowsheet data  found.

## 2021-09-29 NOTE — Anesthesia Postprocedure Evaluation (Signed)
Anesthesia Post Note  Patient: Kyle Lawson  Procedure(s) Performed: Posterior Lumbar Interbody Fusion Lumbar two-three, removal of hardware Lumbar three-five  (Back)     Patient location during evaluation: PACU Anesthesia Type: General Level of consciousness: awake Pain management: pain level controlled Vital Signs Assessment: post-procedure vital signs reviewed and stable Respiratory status: spontaneous breathing Cardiovascular status: stable Postop Assessment: no apparent nausea or vomiting Anesthetic complications: no   No notable events documented.  Last Vitals:  Vitals:   09/29/21 1115 09/29/21 1134  BP: 124/87 121/76  Pulse: 82 80  Resp: (!) 21 18  Temp:  36.7 C  SpO2: 97% 96%    Last Pain:  Vitals:   09/29/21 1115  TempSrc:   PainSc: 8                  Pookela Sellin

## 2021-09-29 NOTE — Op Note (Signed)
Preoperative diagnosis: Lumbar spinal stenosis degenerative disc disease instability L2-3 with possible herniated nucleus pulposus L2-3 bilateral L2-L3 radiculopathies  Postoperative diagnosis: Same  Procedure: #1 decompressive lumbar laminectomy L2-3 with complete medial facetectomies and radical foraminotomies in excess and requiring more work than would be needed with a standard interbody fusion  2.  Posterior lumbar interbody fusion L2-3 utilizing the globus insert and rotate titanium cages packed with locally harvested autograft mixed with vivigen and protios  3.  Exploration fusion removal hardware with removal of top loading knots and rods from L3-L5  4.  Cortical screw fixation L2-L5 with placement of new cortical screws at L2 new rods and new anchoring knots utilizing existing hardware from 3-5  Surgeon: Jillyn Hidden Bradleigh Sonnen  Assistant: Julien Girt  Anesthesia: General  EBL: Minimal  HPI: 44 years man previously undergone L3-L5 fusion did very well for the first 7 months and start experiencing progressive worsening back and bilateral hip and leg pain rating down L3 nerve root pattern.  Work-up revealed progressive spinal stenosis at L2-3 and instability due to the patient's progressive clinical syndrome imaging findings and failed conservative treatment I recommended decompressive laminectomy interbody fusion at L2-3.  I extensively went over the risks and benefits of that operation with him as well as perioperative course expectations of outcome and alternatives to surgery and he understood and agreed to proceed forward.  Operative procedure: Patient was brought into the OR was [facility] general anesthesia positioned prone on the Wilson frame his back was prepped and draped in routine sterile fashion.  His old incision was opened up and extended slightly cephalad after infiltration of 10 cc lidocaine with epi subperiosteal dissection was carried lamina of L2 exposing the cortical screw entry  point at L2 and then exposed the hardware from L3-L5.  At this point the spinous process of L2 was removed central decompression was begun.  Complete medial facetectomies were performed abdomen blew through the pars interarticularis at L2 to decompress and skeletonized the L2 nerve root I aggressively under bit the supra reticulating facet at L3 to gain access to the lateral margin of disc base and tract the L3 nerve root out through the scar inferiorly decompress that as well.  Disc base was then incised and cleaned out bilaterally utilizing sequential distraction.  There was some herniated disc material primarily to the right and this was all removed endplates were all prepared I selected 9-12 titanium cages packed with locally harvested autograft mix and inserted it with an extensive mount of autograft mix packed centrally between the cages.  Then cortical screws placed at L2 under fluoroscopy in routine fashion.  I then disconnected the knots aligned all the screw heads postop fluoroscopy confirmed good position of all the implants and I then attached top loading heads to the L2 cortical screws selected 100 mill millimeter rods and anchored everything in place compressing L2 against L3.  I then reinspected the foramina to confirm patency no migration of graft material lay Gelfoam in the dura and the muscle fascia reapproximated layers after placement of a medium Hemovac drain and injecting Exparel in the fascia.  Skin was closed running 4 subcuticular Dermabond benzoin Steri-Strips and a sterile dressing was applied patient recovery room in stable condition.  At the end the case all needle count sponge counts were correct.

## 2021-09-30 MED ORDER — HYDROCODONE-ACETAMINOPHEN 5-325 MG PO TABS: 1.0000 | ORAL_TABLET | ORAL | 0 refills | Status: DC | PRN

## 2021-09-30 MED ORDER — CYCLOBENZAPRINE HCL 10 MG PO TABS: 10.0000 mg | ORAL_TABLET | Freq: Three times a day (TID) | ORAL | 0 refills | Status: AC | PRN

## 2021-09-30 NOTE — Plan of Care (Signed)
Pt doing well. Pt given D/C instructions with verbal understanding, Rx's were sent to the pharmacy by MD. Pt's incision is clean and dry with no sign of infection. Pt's IV was removed prior to D/C. Pt D/C'd home via wheelchair per MD order. Pt is stable @ D/C and has no other needs at this time. Tequan Redmon, RN  °

## 2021-09-30 NOTE — Evaluation (Signed)
Occupational Therapy Evaluation Patient Details Name: Kyle Lawson MRN: [id number] DOB: [birth year] Today's Date: 09/30/2021   History of Present Illness 66 yo M s/p Posterior lumbar interbody fusion L2-3.  PMH includes: Arthritis, Hyperlipidemia, Hypertension, Thoracic aortic aneurysm, prior PLIF and THA.   Clinical Impression   Patient admitted for the procedure above.  PTA he lives at home with his spouse, and needed no assist with any aspect of ADL/IADL or mobility.  He is experiencing incision pain post surgery, but is at his baseline for in room mobility and ALD completion.  No further OT is needed.  Precaution handout given, precautions reviewed, and all questions answered.        Recommendations for follow up therapy are one component of a multi-disciplinary discharge planning process, led by the attending physician.  Recommendations may be updated based on patient status, additional functional criteria and insurance authorization.   Follow Up Recommendations  No OT follow up    Assistance Recommended at Discharge PRN  Patient can return home with the following      Functional Status Assessment  Patient has not had a recent decline in their functional status  Equipment Recommendations  None recommended by OT    Recommendations for Other Services       Precautions / Restrictions Precautions Precautions: Back Precaution Booklet Issued: Yes (comment) Required Braces or Orthoses: Spinal Brace Spinal Brace: Lumbar corset Restrictions Weight Bearing Restrictions: No Other Position/Activity Restrictions: No brace required, patient has his own.      Mobility Bed Mobility Overal bed mobility: Modified Independent                  Transfers Overall transfer level: Independent                        Balance Overall balance assessment: No apparent balance deficits (not formally assessed)                                         ADL  either performed or assessed with clinical judgement   ADL Overall ADL's : At baseline                                             Vision Baseline Vision/History: 1 Wears glasses Patient Visual Report: No change from baseline       Perception Perception Perception: Not tested   Praxis Praxis Praxis: Not tested    Pertinent Vitals/Pain Pain Assessment Pain Assessment: Faces Faces Pain Scale: Hurts even more Pain Location: incision Pain Descriptors / Indicators: Operative site guarding Pain Intervention(s): Monitored during session     Hand Dominance Right   Extremity/Trunk Assessment Upper Extremity Assessment Upper Extremity Assessment: Overall WFL for tasks assessed   Lower Extremity Assessment Lower Extremity Assessment: Overall WFL for tasks assessed   Cervical / Trunk Assessment Cervical / Trunk Assessment: Back Surgery   Communication Communication Communication: No difficulties   Cognition Arousal/Alertness: Awake/alert Behavior During Therapy: WFL for tasks assessed/performed Overall Cognitive Status: Within Functional Limits for tasks assessed  General Comments   VSS on RA    Exercises     Shoulder Instructions      Home Living Family/patient expects to be discharged to:: Private residence Living Arrangements: Spouse/significant other Available Help at Discharge: Family;Available 24 hours/day Type of Home: House Home Access: Stairs to enter [employer] of Steps: 2 Entrance Stairs-Rails: Left Home Layout: Two level Alternate Level Stairs-Number of Steps: flight Alternate Level Stairs-Rails: Right;Left;Can reach both Bathroom Shower/Tub: Producer, television/film/video: Standard Bathroom Accessibility: Yes How Accessible: Accessible via walker Home Equipment: Grab bars - tub/shower;Grab bars - toilet;Rollator (4 wheels);Shower seat          Prior  Functioning/Environment Prior Level of Function : Independent/Modified Independent;Driving                        OT Problem List: Pain      OT Treatment/Interventions:      OT Goals(Current goals can be found in the care plan section) Acute Rehab OT Goals Patient Stated Goal: Returning home today OT Goal Formulation: With patient Time For Goal Achievement: 09/30/21 Potential to Achieve Goals: Good  OT Frequency:      Co-evaluation              AM-PAC OT "6 Clicks" Daily Activity     Outcome Measure Help from another person eating meals?: None Help from another person taking care of personal grooming?: None Help from another person toileting, which includes using toliet, bedpan, or urinal?: None Help from another person bathing (including washing, rinsing, drying)?: None Help from another person to put on and taking off regular upper body clothing?: None Help from another person to put on and taking off regular lower body clothing?: None 6 Click Score: 24   End of Session Equipment Utilized During Treatment: Back brace Nurse Communication: Mobility status  Activity Tolerance: Patient tolerated treatment well Patient left: Other (comment) (Standing at tray table)  OT Visit Diagnosis: Pain                Time: 0811-0830 OT Time Calculation (min): 19 min Charges:  OT General Charges $OT Visit: 1 Visit OT Evaluation $OT Eval Moderate Complexity: 1 Mod  09/30/2021  RP, OTR/L  Acute Rehabilitation Services  Office:  [phone]   Suzanna Obey 09/30/2021, 8:45 AM

## 2021-09-30 NOTE — Evaluation (Signed)
Physical Therapy Evaluation and Discharge Patient Details Name: Kyle Lawson MRN: [id number] DOB: 09-29-1955 Today's Date: 09/30/2021  History of Present Illness  Pt is a 66 y/o male who presents s/p L2-L3 PLIF on 09/29/2021. PMH significant for prior PLIF, HTN, thoracic aortic aneurysm, THR.  Clinical Impression  Patient evaluated by Physical Therapy with no further acute PT needs identified. All education has been completed and the patient has no further questions. Pt was able to demonstrate transfers and ambulation with gross modified independence and no AD. Pt mildly antalgic with ambulation however overall without unsteadiness or LOB. Pt was educated on precautions, brace application/wearing schedule, appropriate activity progression, and car transfer. See below for any follow-up Physical Therapy or equipment needs. PT is signing off. Thank you for this referral.        Recommendations for follow up therapy are one component of a multi-disciplinary discharge planning process, led by the attending physician.  Recommendations may be updated based on patient status, additional functional criteria and insurance authorization.  Follow Up Recommendations No PT follow up    Assistance Recommended at Discharge PRN  Patient can return home with the following  Help with stairs or ramp for entrance (Supervision)    Equipment Recommendations None recommended by PT  Recommendations for Other Services       Functional Status Assessment Patient has had a recent decline in their functional status and demonstrates the ability to make significant improvements in function in a reasonable and predictable amount of time.     Precautions / Restrictions Precautions Precautions: Back Precaution Booklet Issued: Yes (comment) Precaution Comments: Reviewed handout and pt was cued for precautions during functional mobility. Required Braces or Orthoses: Spinal Brace Spinal Brace: Lumbar corset;Applied in  sitting position Restrictions Weight Bearing Restrictions: No Other Position/Activity Restrictions: No brace required, patient has his own.      Mobility  Bed Mobility               General bed mobility comments: Pt was received standing in room. Pt reports feeling more comfortable standing at this time.    Transfers Overall transfer level: Independent                      Ambulation/Gait Ambulation/Gait assistance: Modified independent (Device/Increase time) Gait Distance (Feet): 560 Feet Assistive device: None Gait Pattern/deviations: Step-through pattern, Decreased stride length, Antalgic Gait velocity: Decreased Gait velocity interpretation: >2.62 ft/sec, indicative of community ambulatory   General Gait Details: Mildly antalgic however overall ambulating well with minimal deviation and good posture.  Stairs Stairs: Yes Stairs assistance: Supervision Stair Management: Two rails, Alternating pattern, Forwards Number of Stairs: 10 General stair comments: VC's for general safety and optimal posture during stair negotiation  Wheelchair Mobility    Modified Rankin (Stroke Patients Only)       Balance Overall balance assessment: Mild deficits observed, not formally tested                                           Pertinent Vitals/Pain Pain Assessment Pain Assessment: Faces Faces Pain Scale: Hurts little more Pain Location: Incision site Pain Descriptors / Indicators: Operative site guarding Pain Intervention(s): Limited activity within patient's tolerance, Monitored during session, Repositioned    Home Living Family/patient expects to be discharged to:: Private residence Living Arrangements: Spouse/significant other Available Help at Discharge: Family;Available 24 hours/day Type of Home:  House Home Access: Stairs to enter Entrance Stairs-Rails: Left Entrance Stairs-Number of Steps: 2 Alternate Level Stairs-Number of Steps:  flight Home Layout: Two level Home Equipment: Grab bars - tub/shower;Grab bars - toilet;Rollator (4 wheels);Shower seat Additional Comments: Wife had a TBI 14 months ago so inside of house is handicap accessible.    Prior Function Prior Level of Function : Independent/Modified Independent;Driving                     Hand Dominance   Dominant Hand: Right    Extremity/Trunk Assessment   Upper Extremity Assessment Upper Extremity Assessment: Defer to OT evaluation    Lower Extremity Assessment Lower Extremity Assessment: Overall WFL for tasks assessed (Mild weakness consistent with pre-op diagnosis however overall still WFL)    Cervical / Trunk Assessment Cervical / Trunk Assessment: Back Surgery  Communication   Communication: No difficulties  Cognition Arousal/Alertness: Awake/alert Behavior During Therapy: WFL for tasks assessed/performed Overall Cognitive Status: Within Functional Limits for tasks assessed                                          General Comments      Exercises     Assessment/Plan    PT Assessment Patient does not need any further PT services  PT Problem List         PT Treatment Interventions      PT Goals (Current goals can be found in the Care Plan section)  Acute Rehab PT Goals Patient Stated Goal: Home today, eventually get back to exercise program PT Goal Formulation: All assessment and education complete, DC therapy    Frequency       Co-evaluation               AM-PAC PT "6 Clicks" Mobility  Outcome Measure Help needed turning from your back to your side while in a flat bed without using bedrails?: None Help needed moving from lying on your back to sitting on the side of a flat bed without using bedrails?: None Help needed moving to and from a bed to a chair (including a wheelchair)?: None Help needed standing up from a chair using your arms (e.g., wheelchair or bedside chair)?: None Help needed  to walk in hospital room?: None Help needed climbing 3-5 steps with a railing? : A Little 6 Click Score: 23    End of Session Equipment Utilized During Treatment: Back brace Activity Tolerance: Patient tolerated treatment well Patient left: Other (comment) (Standing at nurses station talking with staff) Nurse Communication: Mobility status PT Visit Diagnosis: Unsteadiness on feet (R26.81);Pain Pain - part of body:  (back)    Time: 3810-1751 PT Time Calculation (min) (ACUTE ONLY): 11 min   Charges:   PT Evaluation $PT Eval Low Complexity: 1 Low          Conni Slipper, PT, DPT Acute Rehabilitation Services Pager: [phone] Office: [phone]   Marylynn Pearson 09/30/2021, 8:52 AM

## 2021-09-30 NOTE — Discharge Summary (Signed)
Physician Discharge Summary  Patient ID: Kyle Lawson MRN: [id number] DOB/AGE: November 23, 1955 66 y.o. Estimated body mass index is 27.42 kg/m as calculated from the following:   Height as of this encounter: 5\' 9"  (1.753 m).   Weight as of this encounter: 84.2 kg.   Admit date: 09/29/2021 Discharge date: 09/30/2021  Admission Diagnoses: Degenerative disc disease lumbar spinal stenosis herniated disks L2-3  Discharge Diagnoses: Same Principal Problem:   Spinal stenosis of lumbar region   Discharged Condition: good  Hospital Course: Patient was admitted decompressive laminectomy fusion L2-3 with extension fusion postoperatively patient did very well and stable for discharge home.  Patient will discharge schedule II weeks.  Consults: Significant Diagnostic Studies: Treatments: Posterior lumbar interbody fusion L2-3 Discharge Exam: Blood pressure 116/74, pulse 94, temperature 98.3 F (36.8 C), temperature source Oral, resp. rate 16, height 5\' 9"  (1.753 m), weight 84.2 kg, SpO2 100 %. Strength 5/5 wound clean dry and intact  Disposition: Home  Discharge Instructions      Remove dressing in 72 hours   Complete by: As directed    Call MD for:  difficulty breathing, headache or visual disturbances   Complete by: As directed    Call MD for:  extreme fatigue   Complete by: As directed    Call MD for:  hives   Complete by: As directed    Call MD for:  persistant dizziness or light-headedness   Complete by: As directed    Call MD for:  persistant nausea and vomiting   Complete by: As directed    Call MD for:  redness, tenderness, or signs of infection (pain, swelling, redness, odor or green/yellow discharge around incision site)   Complete by: As directed    Call MD for:  severe uncontrolled pain   Complete by: As directed    Call MD for:  temperature >100.4   Complete by: As directed    Diet - low sodium heart healthy   Complete by: As directed    Driving Restrictions   Complete  by: As directed    No driving for 2 weeks, no riding in the car for 1 week   Increase activity slowly   Complete by: As directed    Lifting restrictions   Complete by: As directed    No lifting more than 8 lbs      Allergies as of 09/30/2021   No Known Allergies      Medication List     TAKE these medications    allopurinol 300 MG tablet Commonly known as: ZYLOPRIM Take 300 mg by mouth every morning.   cyclobenzaprine 10 MG tablet Commonly known as: FLEXERIL Take 1 tablet (10 mg total) by mouth 3 (three) times daily as needed for muscle spasms. What changed: when to take this   diclofenac 50 MG tablet Commonly known as: CATAFLAM Take 50 mg by mouth 2 (two) times daily as needed (pain).   gabapentin 300 MG capsule Commonly known as: NEURONTIN TAKE 1 CAPSULES BY MOUTH 3 TIMES DAILY AS NEEDED What changed:  how much to take how to take this when to take this reasons to take this additional instructions   HYDROcodone-acetaminophen 5-325 MG tablet Commonly known as: NORCO/VICODIN Take 1 tablet by mouth every 4 (four) hours as needed for moderate pain.   losartan 100 MG tablet Commonly known as: COZAAR Take 1 tablet (100 mg total) by mouth daily.   multivitamin with minerals Tabs tablet Take 1 tablet by mouth daily.   simvastatin  40 MG tablet Commonly known as: ZOCOR Take 40 mg by mouth daily.        Follow-up Information     [facility] Follow up.   Specialty: Home Health Services Why: for home health services Contact information: [address] [place] [address] [phone]                 Signed: Mariam Dollar 09/30/2021, 7:42 AM

## 2021-11-01 NOTE — Progress Notes (Signed)
' Phone: [phone]   Subjective:  Patient presents today to establish care.  Prior patient of Dr. Marily Memos.  Chief Complaint  Patient presents with   New Patient (Initial Visit)        See problem oriented charting  The following were reviewed and entered/updated in epic: Past Medical History:  Diagnosis Date   Arthritis    Gout    History of kidney stones    Hyperlipidemia    Hypertension    Thoracic aortic aneurysm 2022   plans for new CTA of thoracic aorta April 2023   Patient Active Problem List   Diagnosis Date Noted   Ascending aorta dilation ([place]) 11/04/2021    Priority: High   Spinal stenosis of lumbar region 09/29/2021    Priority: High   Essential hypertension 11/04/2021    Priority: Medium    Hyperlipidemia, unspecified 11/04/2021    Priority: Medium    Gout 02/05/2017    Priority: Medium    HNP (herniated nucleus pulposus), lumbar 01/11/2021    Priority: Low   Acromioclavicular joint pain 07/13/2019    Priority: Low   Sternum pain 01/16/2019    Priority: Low   Right anterior shoulder pain 11/08/2017    Priority: Low   Neck pain 07/04/2017    Priority: Low   Lumbar spondylosis 01/19/2017    Priority: Low   Acute bilateral low back pain with bilateral sciatica 01/19/2017    Priority: Low   Past Surgical History:  Procedure Laterality Date   Anterior Cervical Decompression Fusion  2019   DISTAL CLAVICLE EXCISION Right 2020   Right Shoulder Distal Clavicle Excision   LAMINECTOMY  1984   L4-L5   LAMINECTOMY  1996   L3-L4   LUMBAR FUSION  01/11/2021   may 2022- L3-L5   LUMBAR FUSION  09/29/2021   L2-L3   TOTAL HIP ARTHROPLASTY Left    left hip 09/2006   TOTAL HIP ARTHROPLASTY  09/14/2011   Right. Procedure: TOTAL HIP ARTHROPLASTY;  Surgeon: Ninetta Lights, MD;  Location: [facility];  Service: Orthopedics;  Laterality: Right;    Family History  Problem Relation Age of Onset   Diabetes Mother        passed at 29   Skin cancer Father    Gout  Father    Hypertension Father        passed at 53   Hyperlipidemia Father    CAD Brother        CABG 44   Healthy Brother    Healthy Brother    Alcoholism Brother    Depression Brother        suicide age 101   Healthy Maternal Grandmother        lived to 87   Brain cancer Maternal Grandfather        age 34- was an MD   CAD Paternal Grandmother        died in 86s   CAD Paternal Grandfather        died in 74s   Obesity Sister    Healthy Sister     Medications- reviewed and updated Current Outpatient Medications  Medication Sig Dispense Refill   doxycycline (VIBRA-TABS) 100 MG tablet Take 1 tablet (100 mg total) by mouth 2 (two) times daily for 7 days. 14 tablet 0   Multiple Vitamin (MULTIVITAMIN WITH MINERALS) TABS tablet Take 1 tablet by mouth daily.     allopurinol (ZYLOPRIM) 300 MG tablet Take 1 tablet (300 mg total) by mouth  every morning. 90 tablet 3   cyclobenzaprine (FLEXERIL) 10 MG tablet Take 1 tablet (10 mg total) by mouth 3 (three) times daily as needed for muscle spasms. (Patient not taking: Reported on 11/04/2021) 30 tablet 0   diclofenac (CATAFLAM) 50 MG tablet Take 50 mg by mouth 2 (two) times daily as needed (pain). (Patient not taking: Reported on 11/04/2021)     gabapentin (NEURONTIN) 300 MG capsule TAKE 1 CAPSULES BY MOUTH 3 TIMES DAILY AS NEEDED (Patient not taking: Reported on 11/04/2021) 90 capsule 2   HYDROcodone-acetaminophen (NORCO/VICODIN) 5-325 MG tablet Take 1 tablet by mouth every 4 (four) hours as needed for moderate pain. (Patient not taking: Reported on 11/04/2021) 20 tablet 0   losartan (COZAAR) 100 MG tablet Take 1 tablet (100 mg total) by mouth daily. 90 tablet 3   simvastatin (ZOCOR) 40 MG tablet Take 1 tablet (40 mg total) by mouth daily. 90 tablet 3   No current facility-administered medications for this visit.    Allergies-reviewed and updated No Known Allergies  Social History   Social History Narrative   Married 28 years in 2023. Daughter 48,  son 51 in 2023.    From [place]      Retired from [employer]- enjoying retirement      Hobbies: traveling, hiking, walks daily, live music    Objective  Objective:  BP 116/82    Pulse 89    Temp 98.8 F (37.1 C)    Ht 5\' 9"  (1.753 m)    Wt 186 lb 12.8 oz (84.7 kg)    SpO2 95%    BMI 27.59 kg/m  Gen: NAD, resting comfortably CV: RRR no murmurs rubs or gallops Lungs: CTAB no crackles, wheeze, rhonchi Abdomen: soft/nontender/nondistended/normal bowel sounds. No rebound or guarding. Brace helps with back pain- wearing now Ext: no edema Skin: warm, dry    Assessment and Plan:   # Eyelid irritation S:started out as classic stye for 10-11 days- got some purulent drainage with warm compresses- has slowed down on those but notes lesion from inside. Took amoxicillin 2-3x a day for 3 days (has on hand for seeing dentist).   A/P: looks like internal hordeolum that is not resolving- think its reasonable to use doxycycline 5-7 days and if does not resolve can refer to optho. Restart warm compresses. He may see Dr. Prudencio Burly who he saw in the past.   # right tennis elbow S:wearing counter force brace and is improving.  A/P: from avs "Team please give him exercises for tennis elbow I want you to do the exercise 3x a week for a month then once a week for another month. Stop any exercise that causes more than 1-2/10 pain increase. If not doing better within 1-2 months let us refer you to sports medicine- or you can call Dr. Tamala Julian "  # History of back pain- working with Dr. Saintclair Halsted S:5 weeks out from fusion L2-L3- prior fusions down to L5.  History of back pain since he was young- laminectomy L4-L5 in 20s then later L3-L4 laminectomy.  Reports heading in right direction -having some thigh pain and has another MRI pending. He is walking 3-4 miles a day and doing stationary bike -at least able to sleep now A/P: continue close follow up with Dr. Saintclair Halsted. Hoping for reassuring results on MRI.   # ascending  aorta dilation S:Ascending aorta noted to be dilated at 43 mm on ct cardiac scoring but only 41 mm on echocardiogram A/P: plans to do imaging with  Dr. Stanford Breed on yearly basis   # lumbar spondylosis S:Patient does have left-sided L5 nerve root impingement.  Patient had 2 back surgeries one in 1984 and 1996 and both surgeries were laminectomies.  Patient's MRIs do not do show continued nerve impingement.  Patient was no longer responding well to the epidurals. - on gabapentin 300mg , cyclobenzaprine before bed. Not tatking hydrocodone. Also not on diclofenac while fusion is healing- but was told could start in a few weeks A/P: making progress with Dr. Saintclair Halsted- continue close follow up   #Gout S: 0 flares in last 6-12 months on allopurinol 300mg . Uric acid has been controlled- around 5 A/P:well controlled-  continue current meds- will look through records and see when we need repeat- typically yearly  #hypertension- started bp meds at 34 S: medication: loasartan 100mg  BP Readings from Last 3 Encounters:  11/04/21 116/82  09/30/21 116/74  09/27/21 139/84  A/P: Controlled. Continue current medications.   #hyperlipidemia- ct cardiac scoring of 0 in 2022- started in 2010s or so S: Medication:simvastatin 40 mg . No myalgais No results found for: CHOL, HDL, LDLCALC, LDLDIRECT, TRIG, CHOLHDL  A/P: will review records but with ct cardiac scoring being reassuring unlikely to change dose  # colonoscopy had one around 52 and has had polyps- on 5 year interval with [facility]- we are going to get records- most recent 6 years ago   Recommended follow up: Return in about 6 months (around 05/07/2022) for physical or sooner if needed. Future Appointments  Date Time Provider [place]  11/17/2021  3:30 PM [facility] MR 2 [id number] [address] [facility]    Meds ordered this encounter  Medications   doxycycline (VIBRA-TABS) 100 MG tablet    Sig: Take 1 tablet (100 mg total) by mouth 2 (two) times daily for 7 days.     Dispense:  14 tablet    Refill:  0   allopurinol (ZYLOPRIM) 300 MG tablet    Sig: Take 1 tablet (300 mg total) by mouth every morning.    Dispense:  90 tablet    Refill:  3   losartan (COZAAR) 100 MG tablet    Sig: Take 1 tablet (100 mg total) by mouth daily.    Dispense:  90 tablet    Refill:  3   simvastatin (ZOCOR) 40 MG tablet    Sig: Take 1 tablet (40 mg total) by mouth daily.    Dispense:  90 tablet    Refill:  3   I,Jada Bradford,acting as a scribe for Garret Reddish, MD.,have documented all relevant documentation on the behalf of Garret Reddish, MD,as directed by  Garret Reddish, MD while in the presence of Garret Reddish, MD.  I, Garret Reddish, MD, have reviewed all documentation for this visit. The documentation on 11/04/21 for the exam, diagnosis, procedures, and orders are all accurate and complete.  Return precautions advised. Garret Reddish, MD

## 2021-11-02 ENCOUNTER — Other Ambulatory Visit: Payer: Self-pay | Admitting: Neurosurgery

## 2021-11-02 DIAGNOSIS — M48061 Spinal stenosis, lumbar region without neurogenic claudication: Secondary | ICD-10-CM

## 2021-11-04 ENCOUNTER — Ambulatory Visit (INDEPENDENT_AMBULATORY_CARE_PROVIDER_SITE_OTHER): Payer: Medicare Other | Admitting: Family Medicine

## 2021-11-04 ENCOUNTER — Encounter: Payer: Self-pay | Admitting: Family Medicine

## 2021-11-04 ENCOUNTER — Other Ambulatory Visit: Payer: Self-pay

## 2021-11-04 VITALS — BP 116/82 | HR 89 | Temp 98.8°F | Ht 69.0 in | Wt 186.8 lb

## 2021-11-04 DIAGNOSIS — E785 Hyperlipidemia, unspecified: Secondary | ICD-10-CM

## 2021-11-04 DIAGNOSIS — M48061 Spinal stenosis, lumbar region without neurogenic claudication: Secondary | ICD-10-CM

## 2021-11-04 DIAGNOSIS — I10 Essential (primary) hypertension: Secondary | ICD-10-CM

## 2021-11-04 DIAGNOSIS — Z23 Encounter for immunization: Secondary | ICD-10-CM | POA: Diagnosis not present

## 2021-11-04 DIAGNOSIS — M47816 Spondylosis without myelopathy or radiculopathy, lumbar region: Secondary | ICD-10-CM | POA: Diagnosis not present

## 2021-11-04 DIAGNOSIS — I7121 Aneurysm of the ascending aorta, without rupture: Secondary | ICD-10-CM

## 2021-11-04 DIAGNOSIS — I77810 Thoracic aortic ectasia: Secondary | ICD-10-CM

## 2021-11-04 MED ORDER — DOXYCYCLINE HYCLATE 100 MG PO TABS: 100.0000 mg | ORAL_TABLET | Freq: Two times a day (BID) | ORAL | 0 refills | Status: AC

## 2021-11-04 MED ORDER — LOSARTAN POTASSIUM 100 MG PO TABS: 100.0000 mg | ORAL_TABLET | Freq: Every day | ORAL | 3 refills | Status: DC

## 2021-11-04 MED ORDER — ALLOPURINOL 300 MG PO TABS
300.0000 mg | ORAL_TABLET | ORAL | 3 refills | Status: DC
Start: 2021-11-04 — End: 2022-12-01

## 2021-11-04 MED ORDER — SIMVASTATIN 40 MG PO TABS
40.0000 mg | ORAL_TABLET | Freq: Every day | ORAL | 3 refills | Status: DC
Start: 2021-11-04 — End: 2022-09-23

## 2021-11-04 NOTE — Addendum Note (Signed)
Addended by: Candie Chroman on: 11/04/2021 04:26 PM ? ? Modules accepted: Orders ? ?

## 2021-11-04 NOTE — Patient Instructions (Addendum)
Have your colonoscopy report faxed to [name] at [phone] when you have it completed in May. ?BUT also Sign release of information at the check out desk for last colonoscopy ? ?think its reasonable to use doxycycline 5-7 days and if does not resolve can refer to optho. Restart warm compresses.  ? ?Team please give him exercises for tennis elbow ?I want you to do the exercise 3x a week for a month then once a week for another month. Stop any exercise that causes more than 1-2/10 pain increase. If not doing better within 1-2 months let us refer you to sports medicine- or you can call Dr. Katrinka Blazing ? ?Prevnar 20 today.  ? ?See if you can find date of both shingrix- I have the date of one 06/19/20 ? ?Recommended follow up: Return in about 6 months (around 05/07/2022) for physical or sooner if needed.  ?

## 2021-11-17 ENCOUNTER — Other Ambulatory Visit: Payer: Self-pay

## 2021-11-17 ENCOUNTER — Ambulatory Visit
Admission: RE | Admit: 2021-11-17 | Discharge: 2021-11-17 | Disposition: A | Discharge status: Home / Self Care | Payer: Medicare Other | Source: Ambulatory Visit | Attending: Neurosurgery | Admitting: Neurosurgery

## 2021-11-17 DIAGNOSIS — M48061 Spinal stenosis, lumbar region without neurogenic claudication: Secondary | ICD-10-CM

## 2021-11-17 MED ORDER — GADOBENATE DIMEGLUMINE 529 MG/ML IV SOLN
17.0000 mL | Freq: Once | INTRAVENOUS | Status: AC | PRN
  Administered 2021-11-17: 17 mL via INTRAVENOUS

## 2021-11-23 ENCOUNTER — Encounter: Payer: Self-pay | Admitting: Family Medicine

## 2021-11-23 ENCOUNTER — Other Ambulatory Visit: Payer: Self-pay

## 2021-11-23 DIAGNOSIS — H00019 Hordeolum externum unspecified eye, unspecified eyelid: Secondary | ICD-10-CM

## 2021-11-24 ENCOUNTER — Encounter: Payer: Self-pay | Admitting: *Deleted

## 2021-11-24 DIAGNOSIS — I7121 Aneurysm of the ascending aorta, without rupture: Secondary | ICD-10-CM

## 2021-11-26 ENCOUNTER — Encounter: Payer: Self-pay | Admitting: Family Medicine

## 2021-11-30 ENCOUNTER — Other Ambulatory Visit: Payer: Self-pay | Admitting: Neurosurgery

## 2021-11-30 DIAGNOSIS — M48061 Spinal stenosis, lumbar region without neurogenic claudication: Secondary | ICD-10-CM

## 2021-12-08 ENCOUNTER — Ambulatory Visit
Admission: RE | Admit: 2021-12-08 | Discharge: 2021-12-08 | Disposition: A | Discharge status: Home / Self Care | Payer: Medicare Other | Source: Ambulatory Visit | Attending: Neurosurgery | Admitting: Neurosurgery

## 2021-12-08 DIAGNOSIS — M48061 Spinal stenosis, lumbar region without neurogenic claudication: Secondary | ICD-10-CM

## 2021-12-20 ENCOUNTER — Encounter: Payer: Self-pay | Admitting: Cardiology

## 2021-12-21 ENCOUNTER — Other Ambulatory Visit: Payer: Self-pay | Admitting: *Deleted

## 2021-12-21 ENCOUNTER — Ambulatory Visit
Admission: RE | Admit: 2021-12-21 | Discharge: 2021-12-21 | Disposition: A | Discharge status: Home / Self Care | Payer: Medicare Other | Source: Ambulatory Visit | Attending: Cardiology | Admitting: Cardiology

## 2021-12-21 DIAGNOSIS — I7121 Aneurysm of the ascending aorta, without rupture: Secondary | ICD-10-CM

## 2021-12-21 MED ORDER — IOPAMIDOL (ISOVUE-370) INJECTION 76%
75.0000 mL | Freq: Once | INTRAVENOUS | Status: AC | PRN
  Administered 2021-12-21: 75 mL via INTRAVENOUS

## 2021-12-22 ENCOUNTER — Other Ambulatory Visit: Payer: Self-pay | Admitting: *Deleted

## 2021-12-22 DIAGNOSIS — R9389 Abnormal findings on diagnostic imaging of other specified body structures: Secondary | ICD-10-CM

## 2022-01-06 LAB — HM COLONOSCOPY

## 2022-01-18 ENCOUNTER — Ambulatory Visit
Admission: RE | Admit: 2022-01-18 | Discharge: 2022-01-18 | Disposition: A | Discharge status: Home / Self Care | Payer: Medicare Other | Source: Ambulatory Visit | Attending: Cardiology | Admitting: Cardiology

## 2022-01-18 DIAGNOSIS — R9389 Abnormal findings on diagnostic imaging of other specified body structures: Secondary | ICD-10-CM

## 2022-01-18 MED ORDER — IOPAMIDOL (ISOVUE-300) INJECTION 61%
100.0000 mL | Freq: Once | INTRAVENOUS | Status: AC | PRN
  Administered 2022-01-18: 100 mL via INTRAVENOUS

## 2022-03-30 ENCOUNTER — Other Ambulatory Visit: Payer: Self-pay | Admitting: Neurosurgery

## 2022-03-30 DIAGNOSIS — M5412 Radiculopathy, cervical region: Secondary | ICD-10-CM

## 2022-04-11 ENCOUNTER — Encounter: Payer: Self-pay | Admitting: Family Medicine

## 2022-04-11 ENCOUNTER — Ambulatory Visit (INDEPENDENT_AMBULATORY_CARE_PROVIDER_SITE_OTHER): Payer: Medicare Other | Admitting: Family Medicine

## 2022-04-11 VITALS — BP 112/75 | HR 87 | Temp 97.9°F | Ht 69.0 in | Wt 174.8 lb

## 2022-04-11 DIAGNOSIS — I10 Essential (primary) hypertension: Secondary | ICD-10-CM | POA: Diagnosis not present

## 2022-04-11 DIAGNOSIS — R059 Cough, unspecified: Secondary | ICD-10-CM

## 2022-04-11 DIAGNOSIS — J329 Chronic sinusitis, unspecified: Secondary | ICD-10-CM | POA: Diagnosis not present

## 2022-04-11 MED ORDER — AZITHROMYCIN 250 MG PO TABS: ORAL_TABLET | ORAL | 0 refills | Status: DC

## 2022-04-11 MED ORDER — BENZONATATE 200 MG PO CAPS: 200.0000 mg | ORAL_CAPSULE | Freq: Two times a day (BID) | ORAL | 0 refills | Status: DC | PRN

## 2022-04-11 NOTE — Patient Instructions (Signed)
It was very nice to see you today!  Please start the azithromycin and Tessalon.  Let us know if not improving by later this week.  Take care, Dr Jimmey Ralph  PLEASE NOTE:  If you had any lab tests please let us know if you have not heard back within a few days. You may see your results on mychart before we have a chance to review them but we will give you a call once they are reviewed by [name]. If we ordered any referrals today, please let us know if you have not heard from their office within the next week.   Please try these tips to maintain a healthy lifestyle:  Eat at least 3 REAL meals and 1-2 snacks per day.  Aim for no more than 5 hours between eating.  If you eat breakfast, please do so within one hour of getting up.   Each meal should contain half fruits/vegetables, one quarter protein, and one quarter carbs (no bigger than a computer mouse)  Cut down on sweet beverages. This includes juice, soda, and sweet tea.   Drink at least 1 glass of water with each meal and aim for at least 8 glasses per day  Exercise at least 150 minutes every week.

## 2022-04-11 NOTE — Progress Notes (Signed)
   Kyle Lawson is a 66 y.o. male who presents today for an office visit.  Assessment/Plan:  New/Acute Problems: Cough No red flags though does have some slight crackles on exam.  Also has some evidence of possible underlying sinusitis.  Given that symptoms been going on for about a week would be reasonable to start antibiotics at this point.  Will start azithromycin.  Also start Tessalon to help with cough.  Encourage hydration.  He will let us know if not improving.  Will consider chest x-ray if symptoms persist despite above.  Chronic Problems Addressed Today: Essential Hypertension At goal today on losartan 100mg  daily.  Neck Pain Worsened due to recent cough. He will continue management per neurosurgeon. Has upcoming MRI.      Subjective:  HPI:  Patient here with cough for the last week or so. His home covid tests have been negative. He produced a lot of phelgm yesterday that was yellow in color. No treatments tried. No fevers or chills. No chest pain or shortness of breath. No rhinorrhea that he has noticed.        Objective:  Physical Exam: BP 112/75   Pulse 87   Temp 97.9 F (36.6 C) (Temporal)   Ht 5\' 9"  (1.753 m)   Wt 174 lb 12.8 oz (79.3 kg)   SpO2 97%   BMI 25.81 kg/m   Gen: No acute distress, resting comfortably HEENT: TMs are clear effusion.  OP erythematous.  Nasal mucosa erythematous and boggy with clear watery discharge. CV: Regular rate and rhythm with no murmurs appreciated Pulm: Normal work of breathing, slight crackles noted at right lower lung base.  No wheezes. Neuro: Grossly normal, moves all extremities Psych: Normal affect and thought content      Derika Eckles M. , MD 04/11/2022 9:58 AM

## 2022-04-12 ENCOUNTER — Ambulatory Visit
Admission: RE | Admit: 2022-04-12 | Discharge: 2022-04-12 | Disposition: A | Discharge status: Home / Self Care | Payer: Medicare Other | Source: Ambulatory Visit | Attending: Neurosurgery | Admitting: Neurosurgery

## 2022-04-12 DIAGNOSIS — M5412 Radiculopathy, cervical region: Secondary | ICD-10-CM

## 2022-05-11 ENCOUNTER — Encounter: Payer: Medicare Other | Admitting: Family Medicine

## 2022-05-12 ENCOUNTER — Encounter: Payer: Medicare Other | Admitting: Family Medicine

## 2022-05-20 ENCOUNTER — Encounter: Payer: Self-pay | Admitting: Family Medicine

## 2022-05-20 ENCOUNTER — Ambulatory Visit (INDEPENDENT_AMBULATORY_CARE_PROVIDER_SITE_OTHER): Payer: Medicare Other | Admitting: Family Medicine

## 2022-05-20 VITALS — BP 112/78 | HR 89 | Temp 98.3°F | Ht 69.0 in | Wt 175.4 lb

## 2022-05-20 DIAGNOSIS — I10 Essential (primary) hypertension: Secondary | ICD-10-CM | POA: Diagnosis not present

## 2022-05-20 DIAGNOSIS — R351 Nocturia: Secondary | ICD-10-CM

## 2022-05-20 DIAGNOSIS — Z23 Encounter for immunization: Secondary | ICD-10-CM | POA: Diagnosis not present

## 2022-05-20 DIAGNOSIS — E785 Hyperlipidemia, unspecified: Secondary | ICD-10-CM

## 2022-05-20 DIAGNOSIS — Z0000 Encounter for general adult medical examination without abnormal findings: Secondary | ICD-10-CM | POA: Diagnosis not present

## 2022-05-20 DIAGNOSIS — Z1159 Encounter for screening for other viral diseases: Secondary | ICD-10-CM | POA: Diagnosis not present

## 2022-05-20 DIAGNOSIS — K760 Fatty (change of) liver, not elsewhere classified: Secondary | ICD-10-CM | POA: Insufficient documentation

## 2022-05-20 DIAGNOSIS — M109 Gout, unspecified: Secondary | ICD-10-CM

## 2022-05-20 NOTE — Progress Notes (Addendum)
Phone: [phone]   Subjective:  Patient presents today for their Welcome to Medicare Exam    Preventive Screening-Counseling & Management  Vision screen:  Vision Screening   Right eye Left eye Both eyes  Without correction     With correction 20/20 20/16/ 20/16   Advanced directives: Full Code. Wife HCPOA. Living will updated last September.   Modifiable Risk Factors/behavioral risk assessment/psychosocial risk assessment Regular exercise: exercises regularly- lost weight with being active- 4-5 days a week (prior to last visit was less active with surgery) Diet: reasonably healthy diet  Wt Readings from Last 3 Encounters:  05/20/22 175 lb 6.4 oz (79.6 kg)  04/11/22 174 lb 12.8 oz (79.3 kg)  11/04/21 186 lb 12.8 oz (84.7 kg)  Smoking Status: Never Smoker Second Hand Smoking status: No smokers in home Alcohol intake: 4-6 per week Other substance abuse/illicit drugs: none other than sparing marijuana in gummies(primarily pain relief and hasnt needed as much recently)  Cardiac risk factors:  advanced age (older than 56 for men, 40 for women)  Known but treated Hyperlipidemia  Treated Hypertension  No diabetes. No prediabetes previously diagnosed.  No results found for: "HGBA1C" Family History: brother CAD/CABG < 68  yo  Depression Screen/risk evaluation Risk factors: none. PHQ2 0.     05/20/2022    1:25 PM 04/11/2022    9:23 AM 11/04/2021    2:42 PM  Depression screen PHQ 2/9  Decreased Interest 0 0 0  Down, Depressed, Hopeless 0 0 0  PHQ - 2 Score 0 0 0  Altered sleeping   0  Tired, decreased energy   0  Change in appetite   0  Feeling bad or failure about yourself    0  Trouble concentrating   0  Moving slowly or fidgety/restless   0  Suicidal thoughts   0  PHQ-9 Score   0  Difficult doing work/chores   Not difficult at all   Functional ability and level of safety Mobility assessment:  timed get up and go <12 seconds Activities of Daily Living- Independent in  ADLs (toileting, bathing, dressing, transferring, eating) and in IADLs (shopping, housekeeping, managing own medications, and handling finances) Home Safety: Loose rugs (one that he will try to secure), smoke detectors (up to date), small pets (none), grab bars (in shower), stairs (1 flight- no issues navigating- but considering moving from home duet to wife prior TBI there), life-alert system (would use cell phone) Hearing Difficulties: patient declines major issues- some mild hearing loss- has [employer] membership recommended testing there Fall Risk: None     05/20/2022    1:05 PM 04/11/2022    9:23 AM  Fall Risk   Falls in the past year? 0 0  Number falls in past yr: 0 0  Injury with Fall? 0 0  Risk for fall due to : No Fall Risks No Fall Risks  Follow up Falls evaluation completed   Opioid use history:  no long term opioids use- some at times for post surgery Self assessment of health status: "at 71 better than average"  Required Immunizations needed today:  Hoping mid October for updated COVID-19 vaccination- though in July had covid and could push out some perhaps December or january, discussed RSV- certainly could do this at pharmacy  Immunization History  Administered Date(s) Administered   Fluad Quad(high Dose 65+) 05/20/2022   Influenza, High Dose Seasonal PF 06/19/2021   Influenza-Unspecified 06/12/2017, 06/05/2018   PFIZER(Purple Top)SARS-COV-2 Vaccination 11/30/2019, 03/04/2020, 08/03/2020, 02/23/2021  PNEUMOCOCCAL CONJUGATE-20 11/04/2021   Pfizer Covid-19 Vaccine Bivalent Booster 24yrs & up 09/17/2021   Td 12/18/2018   Zoster Recombinat (Shingrix) 07/14/2020, 09/18/2020   Health Maintenance  Topic Date Due   Hepatitis C Screening: USPSTF Recommendation to screen - Ages 11-79 yo.  Never done   COVID-19 Vaccine (6 - Pfizer series) 01/15/2022   Tetanus Vaccine  12/17/2028   Colon Cancer Screening  01/07/2032   Pneumonia Vaccine  Completed   Flu Shot  Completed   Zoster  (Shingles) Vaccine  Completed   HPV Vaccine  Aged Out   Screening tests-hepatitis C screening offered-opts in   Colon cancer screening- 01/06/2022 with [facility] and plan for 5-year follow-up Lung Cancer screening- not a candidate-never smoker Skin cancer screening- yearly with [facility] DR. Haverstock Prostate cancer screening- will get baseline PSA on our labs- he reports was under 3 at least at old jobs possibly 1.2 or so - he thinks could find records if needed  The following were reviewed and entered/updated in epic: Past Medical History:  Diagnosis Date   Arthritis    Gout    History of kidney stones    Hyperlipidemia    Hypertension    Thoracic aortic aneurysm (HCC) 2022   plans for new CTA of thoracic aorta April 2023   Patient Active Problem List   Diagnosis Date Noted   Ascending aorta dilation (HCC) 11/04/2021    Priority: High   Spinal stenosis of lumbar region 09/29/2021    Priority: High   Fatty liver 05/20/2022    Priority: Medium    Essential hypertension 11/04/2021    Priority: Medium    Hyperlipidemia, unspecified 11/04/2021    Priority: Medium    Gout 02/05/2017    Priority: Medium    HNP (herniated nucleus pulposus), lumbar 01/11/2021    Priority: Low   Acromioclavicular joint pain 07/13/2019    Priority: Low   Sternum pain 01/16/2019    Priority: Low   Right anterior shoulder pain 11/08/2017    Priority: Low   Neck pain 07/04/2017    Priority: Low   Lumbar spondylosis 01/19/2017    Priority: Low   Acute bilateral low back pain with bilateral sciatica 01/19/2017    Priority: Low   Past Surgical History:  Procedure Laterality Date   Anterior Cervical Decompression Fusion  2019   DISTAL CLAVICLE EXCISION Right 2020   Right Shoulder Distal Clavicle Excision   LAMINECTOMY  1984   L4-L5   LAMINECTOMY  1996   L3-L4   LUMBAR FUSION  01/11/2021   may 2022- L3-L5   LUMBAR FUSION  09/29/2021   L2-L3   TOTAL HIP ARTHROPLASTY Left    left  hip 09/2006   TOTAL HIP ARTHROPLASTY  09/14/2011   Right. Procedure: TOTAL HIP ARTHROPLASTY;  Surgeon: Loreta Ave, MD;  Location: [facility];  Service: Orthopedics;  Laterality: Right;    Family History  Problem Relation Age of Onset   Diabetes Mother        passed at 35   Skin cancer Father    Gout Father    Hypertension Father        passed at 26   Hyperlipidemia Father    CAD Brother        CABG 71   Healthy Brother    Healthy Brother    Alcoholism Brother    Depression Brother        suicide age 44   Healthy Maternal Grandmother  lived to 89   Brain cancer Maternal Grandfather        age 52- was an MD   CAD Paternal Grandmother        died in 83s   CAD Paternal Grandfather        died in 39s   Obesity Sister    Healthy Sister    Medications- reviewed and updated Current Outpatient Medications  Medication Sig Dispense Refill   allopurinol (ZYLOPRIM) 300 MG tablet Take 1 tablet (300 mg total) by mouth every morning. 90 tablet 3   cyclobenzaprine (FLEXERIL) 10 MG tablet Take 1 tablet (10 mg total) by mouth 3 (three) times daily as needed for muscle spasms. 30 tablet 0   diclofenac (CATAFLAM) 50 MG tablet Take 50 mg by mouth 2 (two) times daily as needed (pain).     gabapentin (NEURONTIN) 300 MG capsule TAKE 1 CAPSULES BY MOUTH 3 TIMES DAILY AS NEEDED 90 capsule 2   Multiple Vitamin (MULTIVITAMIN WITH MINERALS) TABS tablet Take 1 tablet by mouth daily.     simvastatin (ZOCOR) 40 MG tablet Take 1 tablet (40 mg total) by mouth daily. 90 tablet 3   losartan (COZAAR) 100 MG tablet Take 1 tablet (100 mg total) by mouth daily. 90 tablet 3   No current facility-administered medications for this visit.    Allergies-reviewed and updated No Known Allergies  Social History   Socioeconomic History   Marital status: Married    Spouse name: Not on file   Number of children: 2   Years of education: Not on file   Highest education level: Not on file  Occupational History    Not on file  Tobacco Use   Smoking status: Never   Smokeless tobacco: Never  Vaping Use   Vaping Use: Never used  Substance and Sexual Activity   Alcohol use: Not Currently    Alcohol/week: 4.0 - 5.0 standard drinks of alcohol    Types: 4 - 5 Cans of beer per week   Drug use: Yes    Types: Marijuana    Comment: for pain- very occasionally   Sexual activity: Yes    Partners: Female  Other Topics Concern   Not on file  Social History Narrative   Married 28 years in 2023. Daughter 17, son 46 in 2023.    From [place]      Retired from [employer]- enjoying retirement      Hobbies: traveling, hiking, walks daily, live music   Social Determinants of Radio broadcast assistant Strain: None  Food Insecurity: No issues  Transportation Needs: No issues  Physical Activity: excellent recently- see above  Stress: better since retirement  Social Connections:feels has meaningful community   Objective  Objective:  BP 112/78   Pulse 89   Temp 98.3 F (36.8 C)   Ht 5\' 9"  (1.753 m)   Wt 175 lb 6.4 oz (79.6 kg)   SpO2 97%   BMI 25.90 kg/m  Gen: NAD, resting comfortably HEENT: Mucous membranes are moist. Oropharynx normal Neck: no thyromegaly CV: RRR no murmurs rubs or gallops Lungs: CTAB no crackles, wheeze, rhonchi Abdomen: soft/nontender/nondistended/normal bowel sounds. No rebound or guarding.  Ext: no edema Skin: warm, dry Neuro: grossly normal, moves all extremities, PERRLA   EKG: sinus rhythm with rate 80, normal axis, normal intervals, no hypertrophy, no st or t wave changes    Assessment and Plan:   Welcome to Medicare exam completed-  Educated, counseled and referred based on above elements  Educated, counseled and referred as appropriate for preventative needs Discussed and documented a written plan for preventiative services and screenings with personalized health advice- After Visit Summary was given to patient  digitally (declined printed copy) which included  this plan  4. EKG offered SO:[id number]- patient opts in- update EKG  Status of chronic or acute concerns   #History of back pain-working with Dr. Saintclair Halsted -History of lumbar spondylosis-history of 2 back surgeries in 1984 1996 with laminectomies as well as more recent surgery earlier this year with fusion of L2-L3 -No longer responding well to epidurals -Gabapentin and cyclobenzaprine have been helpful. -he is feeling pretty pleased overall  #Neck pain with left radiculopathy- MR cervical spine from 04/12/22- "ACDF C4-5 and C5-6 without stenosis.Left foraminal narrowing at C6-7 due to disc protrusion and spurring. Mild right foraminal narrowing C6-7." -had injection 4 weeks ago with at least 85% relief and doing PT and woring with PT- traction and dry needling  #Ascending aortic dilation-only 41 mm on echocardiogram and plans for annual imaging with Dr. Stanford Breed- had CT angio 12/21/21- stable at 4 cm - also noted 3.4 cm likely hemangioma? on liver  and fatty liver- monitor LFTS -had repeat a month late rand stable- read as vascular shunt and no mass  - knows to avoid quinolones  #significant cough in early august treated by Dr. Jerline Pain- rather significant symptoms and high volume green phlegm for a week but thankfully no residual issues- azithromycin  #Gout S: 0  flares on allopurinol 300 mg No results found for: "LABURIC" A/P:update uric acid to have on file- goal 6 or less but no flares so hold off on meds    #hypertension S: medication: losartan 100 mg -checks bp at home- slightly higher- will bring cuff BP Readings from Last 3 Encounters:  05/20/22 112/78  04/11/22 112/75  11/04/21 116/82  A/P: Controlled. Continue current medications.   #hyperlipidemia-with CT cardiac scoring of 0 in 2022 #aortic atherosclerosis S: Medication:Simvastatin 40 mg-no myalgias Lab Results  Component Value Date   CHOL 199 04/19/2021   HDL 49 04/19/2021   LDLCALC 115 04/19/2021   TRIG 200 (A)  04/19/2021   A/P: Update lipids today but unlikely to increase strength of statin with reassuring CT cardiac scoring- could push for LDL under 70 but with reassuring CT cardiac scoring I balance that heaiver than aortic atherosclerosi   Recommended follow up: 1 year follow up  Future Appointments  Date Time Provider [place]  05/31/2022  9:00 AM Mcarthur Rossetti, MD [facility] None   Lab/Order associations:   ICD-10-CM   1. Welcome to Medicare preventive visit  Z00.00 EKG 12-Lead    2. Need for immunization against influenza  Z23 Flu Vaccine QUAD High Dose(Fluad)    3. Encounter for hepatitis C screening test for low risk patient  Z11.59 Hepatitis C antibody    4. Essential hypertension  I10 CBC with Differential/Platelet    Comprehensive metabolic panel    Lipid panel    EKG 12-Lead    5. Hyperlipidemia, unspecified hyperlipidemia type  E78.5 CBC with Differential/Platelet    Comprehensive metabolic panel    Lipid panel    6. Nocturia  R35.1 PSA    7. Gout, unspecified cause, unspecified chronicity, unspecified site  M10.9 Uric acid    8. Fatty liver  K76.0      No orders of the defined types were placed in this encounter.  Return precautions advised. Garret Reddish, MD

## 2022-05-20 NOTE — Patient Instructions (Addendum)
EKG today   Let us know via mychart when you get your next COVID vaccine- ok to push probably 6 months from injectoin  Schedule a lab visit at the check out desk within 2 weeks. Return for future fasting labs meaning nothing but water after midnight please. Ok to take your medications with water.   Kyle Lawson , Thank you for taking time to come for your Medicare Wellness Visit. I appreciate your ongoing commitment to your health goals. Please review the following plan we discussed and let me know if I can assist you in the future.   These are the goals we discussed: Continue excellent job with exercise  This is a list of the screening recommended for you and due dates:  Health Maintenance  Topic Date Due   Hepatitis C Screening: USPSTF Recommendation to screen - Ages 60-79 yo.  Never done   COVID-19 Vaccine (6 - Pfizer series) 01/15/2022   Tetanus Vaccine  12/17/2028   Colon Cancer Screening  01/07/2032   Pneumonia Vaccine  Completed   Flu Shot  Completed   Zoster (Shingles) Vaccine  Completed   HPV Vaccine  Aged Out   Recommended follow up: Return in about 1 year (around 05/21/2023) for followup or sooner if needed.Schedule b4 you leave.

## 2022-05-24 ENCOUNTER — Other Ambulatory Visit (INDEPENDENT_AMBULATORY_CARE_PROVIDER_SITE_OTHER): Payer: Medicare Other

## 2022-05-24 DIAGNOSIS — M109 Gout, unspecified: Secondary | ICD-10-CM | POA: Diagnosis not present

## 2022-05-24 DIAGNOSIS — I10 Essential (primary) hypertension: Secondary | ICD-10-CM | POA: Diagnosis not present

## 2022-05-24 DIAGNOSIS — R351 Nocturia: Secondary | ICD-10-CM | POA: Diagnosis not present

## 2022-05-24 DIAGNOSIS — E785 Hyperlipidemia, unspecified: Secondary | ICD-10-CM

## 2022-05-24 DIAGNOSIS — Z1159 Encounter for screening for other viral diseases: Secondary | ICD-10-CM

## 2022-05-24 LAB — COMPREHENSIVE METABOLIC PANEL
ALT: 24 U/L (ref 0–53)
AST: 28 U/L (ref 0–37)
Albumin: 4 g/dL (ref 3.5–5.2)
Alkaline Phosphatase: 76 U/L (ref 39–117)
BUN: 17 mg/dL (ref 6–23)
CO2: 27 mEq/L (ref 19–32)
Calcium: 9 mg/dL (ref 8.4–10.5)
Chloride: 101 mEq/L (ref 96–112)
Creatinine, Ser: 0.88 mg/dL (ref 0.40–1.50)
GFR: 89.82 mL/min (ref >60.00)
Glucose, Bld: 91 mg/dL (ref 70–99)
Potassium: 3.9 mEq/L (ref 3.5–5.1)
Sodium: 136 mEq/L (ref 135–145)
Total Bilirubin: 0.9 mg/dL (ref 0.2–1.2)
Total Protein: 6.6 g/dL (ref 6.0–8.3)

## 2022-05-24 LAB — CBC WITH DIFFERENTIAL/PLATELET
Basophils Absolute: 0 10*3/uL (ref 0.0–0.1)
Basophils Relative: 0.8 % (ref 0.0–3.0)
Eosinophils Absolute: 0.2 10*3/uL (ref 0.0–0.7)
Eosinophils Relative: 2.7 % (ref 0.0–5.0)
HCT: 41.4 % (ref 39.0–52.0)
Hemoglobin: 14 g/dL (ref 13.0–17.0)
Lymphocytes Relative: 38 % (ref 12.0–46.0)
Lymphs Abs: 2.4 10*3/uL (ref 0.7–4.0)
MCHC: 33.8 g/dL (ref 30.0–36.0)
MCV: 99.1 fl (ref 78.0–100.0)
Monocytes Absolute: 0.5 10*3/uL (ref 0.1–1.0)
Monocytes Relative: 8.5 % (ref 3.0–12.0)
Neutro Abs: 3.2 10*3/uL (ref 1.4–7.7)
Neutrophils Relative %: 50 % (ref 43.0–77.0)
Platelets: 236 10*3/uL (ref 150.0–400.0)
RBC: 4.18 Mil/uL — ABNORMAL LOW (ref 4.22–5.81)
RDW: 12.6 % (ref 11.5–15.5)
WBC: 6.3 10*3/uL (ref 4.0–10.5)

## 2022-05-24 LAB — LIPID PANEL
Cholesterol: 197 mg/dL (ref 0–200)
HDL: 56.8 mg/dL (ref >39.00)
LDL Cholesterol: 113 mg/dL — ABNORMAL HIGH (ref 0–99)
NonHDL: 140.31
Total CHOL/HDL Ratio: 3
Triglycerides: 135 mg/dL (ref 0.0–149.0)
VLDL: 27 mg/dL (ref 0.0–40.0)

## 2022-05-24 LAB — URIC ACID: Uric Acid, Serum: 4.4 mg/dL (ref 4.0–7.8)

## 2022-05-24 LAB — PSA: PSA: 1.19 ng/mL (ref 0.10–4.00)

## 2022-05-25 ENCOUNTER — Encounter: Payer: Self-pay | Admitting: Family Medicine

## 2022-05-25 LAB — HEPATITIS C ANTIBODY: Hepatitis C Ab: NONREACTIVE

## 2022-05-31 ENCOUNTER — Ambulatory Visit (INDEPENDENT_AMBULATORY_CARE_PROVIDER_SITE_OTHER): Payer: Medicare Other

## 2022-05-31 ENCOUNTER — Ambulatory Visit (INDEPENDENT_AMBULATORY_CARE_PROVIDER_SITE_OTHER): Payer: Medicare Other | Admitting: Orthopaedic Surgery

## 2022-05-31 ENCOUNTER — Encounter: Payer: Self-pay | Admitting: Orthopaedic Surgery

## 2022-05-31 DIAGNOSIS — Z96642 Presence of left artificial hip joint: Secondary | ICD-10-CM

## 2022-05-31 DIAGNOSIS — M25552 Pain in left hip: Secondary | ICD-10-CM

## 2022-05-31 NOTE — Addendum Note (Signed)
Addended by: Elvin So L on: 05/31/2022 11:20 AM   Modules accepted: Orders

## 2022-05-31 NOTE — Progress Notes (Signed)
The patient is a very active 66 year old with a history of bilateral hip replacements done at different times the posterior approach.  These were over 16 years ago.  The right hip has a ceramic head on polyliner where the left hip is ceramic head on ceramic liner.  He has been having some left hip and groin pain for several months now.  He has actually been through several sessions of physical therapy with activity modification, stretching, several modalities per the therapist discretion including dry needling.  He is a thin individual.  Is not diabetic.  He denies any chest pain, shortness of breath, fever, chills, nausea, vomiting.  He denies any specific injury.  He has been golfing as well.  He has a history of a lumbar spine fusion earlier at the beginning of this year.  He has been fine since then as well.  Both hips move smoothly and fluidly with no blocks to rotation.  I did not elicit any type of squeaking from the ceramic on ceramic hip on the left side.  An AP pelvis and lateral of the left hip shows bilateral total hip arthroplasties.  The femoral components appear bone ingrown.  There is a little bit more posterior version of the left hip.  I do not see any gross lucencies around the acetabular component.  At this point we need to studies to further assess his left hip.  1 will be a three-phase bone scan to rule out prosthetic loosening and the second will be a MRI to rule out osteolysis and any worrisome fluid collections around the left hip.  We will then see him back in follow-up.  He agrees with this treatment plan.  All question concerns were answered and addressed.

## 2022-06-15 ENCOUNTER — Encounter (HOSPITAL_COMMUNITY)
Admission: RE | Admit: 2022-06-15 | Discharge: 2022-06-15 | Disposition: A | Discharge status: Home / Self Care | Payer: Medicare Other | Source: Ambulatory Visit | Attending: Orthopaedic Surgery | Admitting: Orthopaedic Surgery

## 2022-06-15 ENCOUNTER — Ambulatory Visit (HOSPITAL_COMMUNITY)
Admission: RE | Admit: 2022-06-15 | Discharge: 2022-06-15 | Disposition: A | Discharge status: Home / Self Care | Payer: Medicare Other | Source: Ambulatory Visit | Attending: Orthopaedic Surgery | Admitting: Orthopaedic Surgery

## 2022-06-15 DIAGNOSIS — M25552 Pain in left hip: Secondary | ICD-10-CM

## 2022-06-15 DIAGNOSIS — Z96642 Presence of left artificial hip joint: Secondary | ICD-10-CM

## 2022-06-15 MED ORDER — TECHNETIUM TC 99M MEDRONATE IV KIT
20.0000 | PACK | Freq: Once | INTRAVENOUS | Status: AC | PRN
  Administered 2022-06-15: 20 via INTRAVENOUS

## 2022-06-22 ENCOUNTER — Ambulatory Visit (INDEPENDENT_AMBULATORY_CARE_PROVIDER_SITE_OTHER): Payer: Medicare Other | Admitting: Orthopaedic Surgery

## 2022-06-22 ENCOUNTER — Encounter: Payer: Self-pay | Admitting: Orthopaedic Surgery

## 2022-06-22 DIAGNOSIS — Z96641 Presence of right artificial hip joint: Secondary | ICD-10-CM

## 2022-06-22 DIAGNOSIS — Z96642 Presence of left artificial hip joint: Secondary | ICD-10-CM | POA: Diagnosis not present

## 2022-06-22 NOTE — Progress Notes (Signed)
The patient comes in today to go over an MRI of his left hip which also assist the right hip.  He has a history of both his hips being replaced with the more recent one was the right hip in 2013 and I believe the left hip was placed in 2008.  This was done elsewhere.  He is 66 years old and thin and incredibly active.  His plain films of both hips show well-seated implants.  I felt it was reasonable to obtain an MRI to rule out any evidence of osteolysis and fluid collections.  The MRI does show some tendinosis of the gluteus medius and minimus tendons and hamstring but otherwise the replacements themselves look good with no evidence of loosening and osteolysis.  There is no fluid collections around either hip.  His plain films also show well-seated implants.  This was good news from that standpoint.  He will continue to increase his activities as comfort allows and I am fine with him working with physical therapy.  If he does have worsening problems at all with his hips he knows to let us know.

## 2022-07-21 ENCOUNTER — Other Ambulatory Visit: Payer: Self-pay | Admitting: Neurosurgery

## 2022-07-21 DIAGNOSIS — M48061 Spinal stenosis, lumbar region without neurogenic claudication: Secondary | ICD-10-CM

## 2022-08-18 ENCOUNTER — Other Ambulatory Visit: Payer: Self-pay | Admitting: Cardiology

## 2022-08-18 DIAGNOSIS — I10 Essential (primary) hypertension: Secondary | ICD-10-CM

## 2022-08-19 ENCOUNTER — Ambulatory Visit
Admission: RE | Admit: 2022-08-19 | Discharge: 2022-08-19 | Disposition: A | Discharge status: Home / Self Care | Payer: Medicare Other | Source: Ambulatory Visit | Attending: Neurosurgery | Admitting: Neurosurgery

## 2022-08-19 DIAGNOSIS — M48061 Spinal stenosis, lumbar region without neurogenic claudication: Secondary | ICD-10-CM

## 2022-09-23 ENCOUNTER — Other Ambulatory Visit: Payer: Self-pay

## 2022-09-23 MED ORDER — SIMVASTATIN 40 MG PO TABS: 40.0000 mg | ORAL_TABLET | Freq: Every day | ORAL | 3 refills | Status: DC

## 2022-11-14 ENCOUNTER — Other Ambulatory Visit: Payer: Self-pay | Admitting: Cardiology

## 2022-11-14 DIAGNOSIS — I10 Essential (primary) hypertension: Secondary | ICD-10-CM

## 2022-11-16 ENCOUNTER — Other Ambulatory Visit: Payer: Self-pay | Admitting: *Deleted

## 2022-11-16 ENCOUNTER — Encounter: Payer: Self-pay | Admitting: *Deleted

## 2022-11-16 DIAGNOSIS — I7121 Aneurysm of the ascending aorta, without rupture: Secondary | ICD-10-CM

## 2022-12-01 ENCOUNTER — Other Ambulatory Visit: Payer: Self-pay | Admitting: Family Medicine

## 2022-12-23 ENCOUNTER — Ambulatory Visit
Admission: RE | Admit: 2022-12-23 | Discharge: 2022-12-23 | Disposition: A | Discharge status: Home / Self Care | Payer: Medicare Other | Source: Ambulatory Visit | Attending: Cardiology | Admitting: Cardiology

## 2022-12-23 DIAGNOSIS — I7121 Aneurysm of the ascending aorta, without rupture: Secondary | ICD-10-CM

## 2022-12-23 MED ORDER — IOPAMIDOL (ISOVUE-370) INJECTION 76%
75.0000 mL | Freq: Once | INTRAVENOUS | Status: AC | PRN
  Administered 2022-12-23: 75 mL via INTRAVENOUS

## 2023-01-23 ENCOUNTER — Other Ambulatory Visit: Payer: Self-pay | Admitting: Family Medicine

## 2023-01-23 DIAGNOSIS — I10 Essential (primary) hypertension: Secondary | ICD-10-CM

## 2023-03-05 IMAGING — CT CT L SPINE W/O CM
3 of 6 series · 11 of 33 positions shown, 13 images · non-contrast
Comparison: MRI lumbar spine 07/02/2021

CLINICAL DATA: Back pain with bilateral thigh pain. History of
lumbar fusion.

EXAM:
CT LUMBAR SPINE WITHOUT CONTRAST
TECHNIQUE: Multidetector CT imaging of the lumbar spine was performed without
intravenous contrast administration. Multiplanar CT image
reconstructions were also generated.

[Series 7: l-spine 2.00 br44 s3 sag soft · sagittal · 0.31mm/px · 5 of 65 slices shown, 6 images]
[im 22/65  bone]
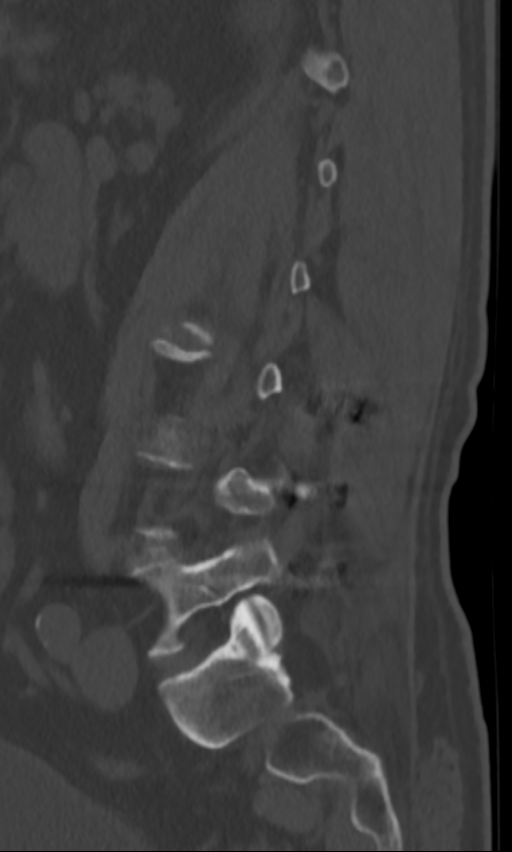
[im 27/65  bone]
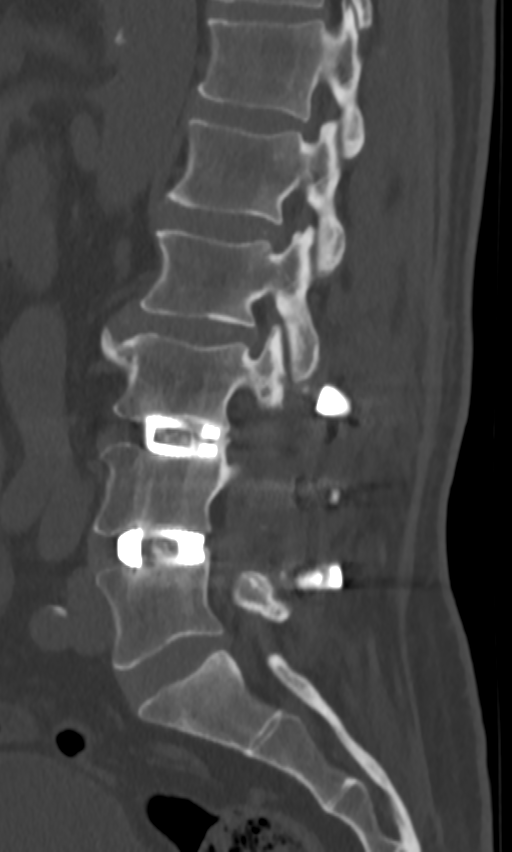
[im 33/65  soft-tissue]
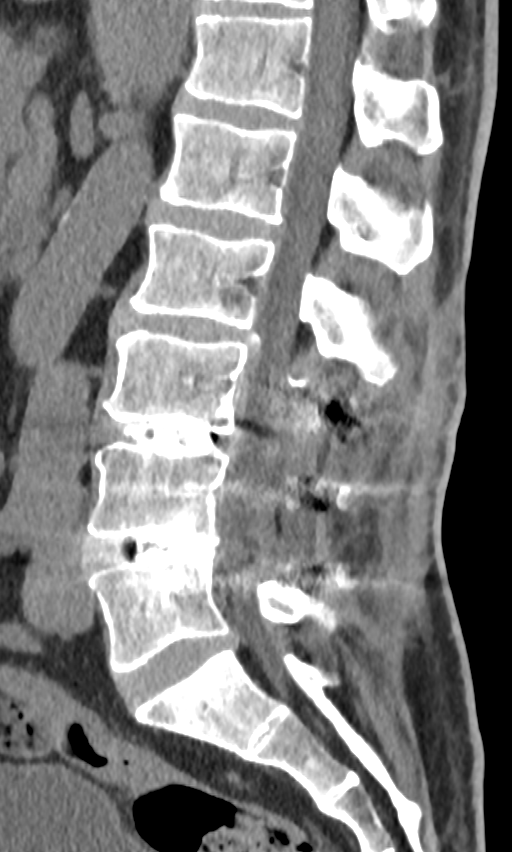
[im 33/65  bone]
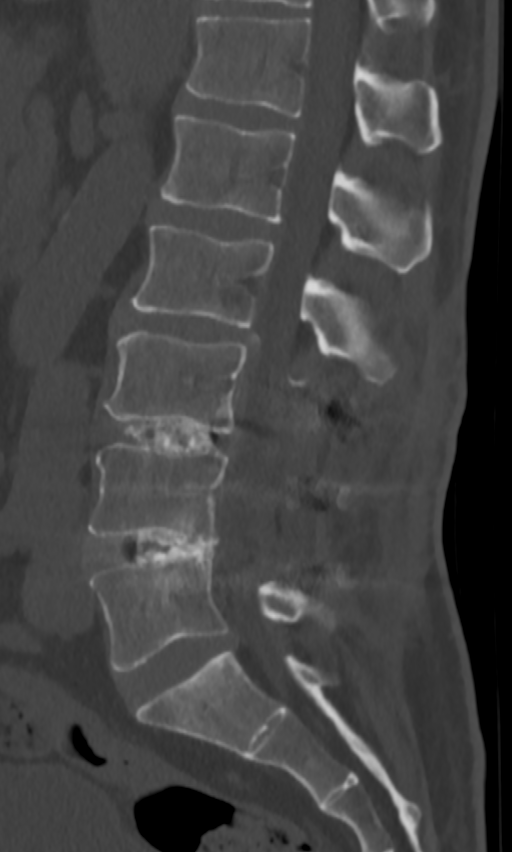
[im 38/65  bone]
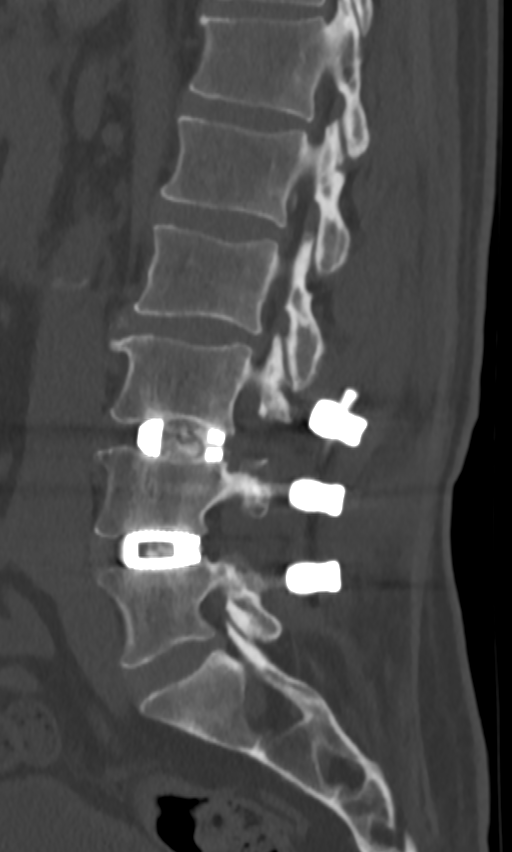
[im 43/65  bone]
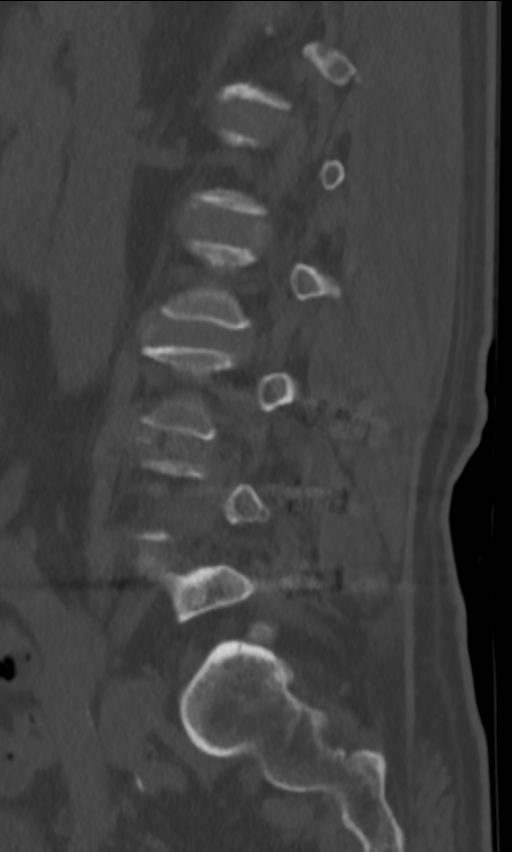

[Series 9: l-spine 2.00 br60 s3 cor bone · coronal · 0.27mm/px · 3 of 79 slices shown]
[im 16/79  bone]
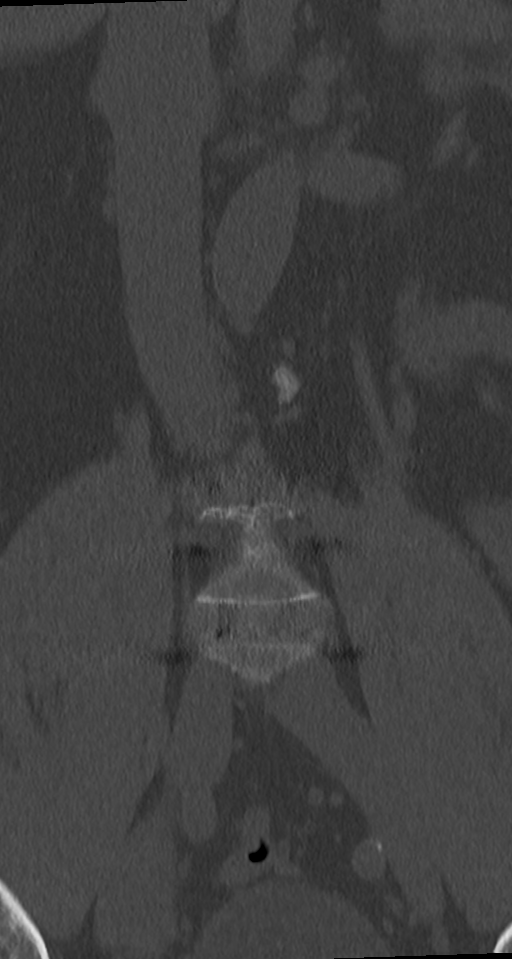
[im 32/79  bone]
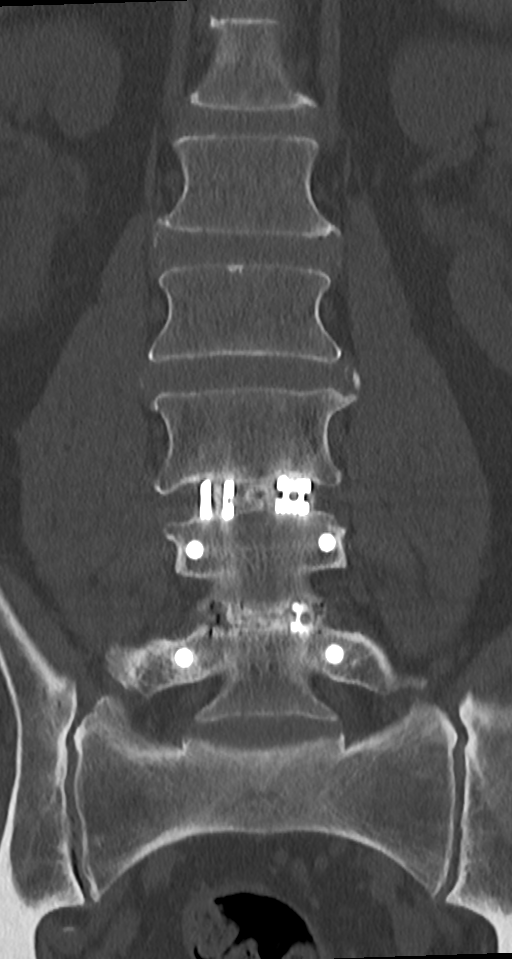
[im 47/79  bone]
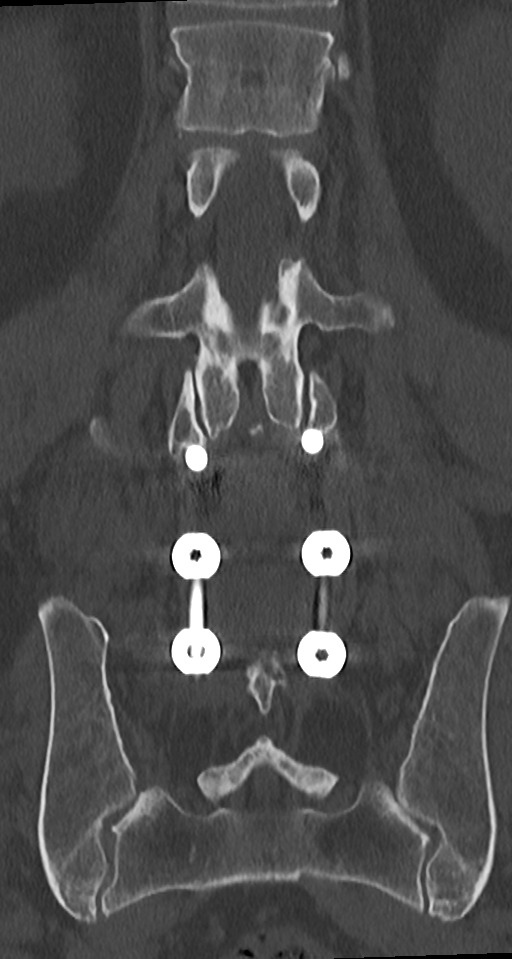

[Series 12: l-spine 2.00 br60 s3 true axials bone · axial · 0.31mm/px · z∈[+1248,+1521]mm · 3 of 121 slices shown, 4 images]
[im 1/121  soft-tissue]
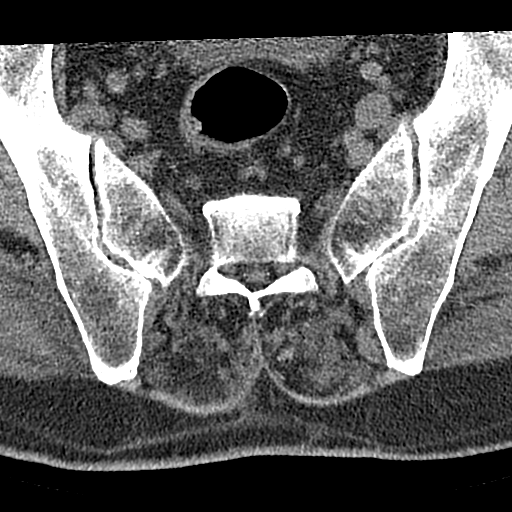
[im 1/121  bone]
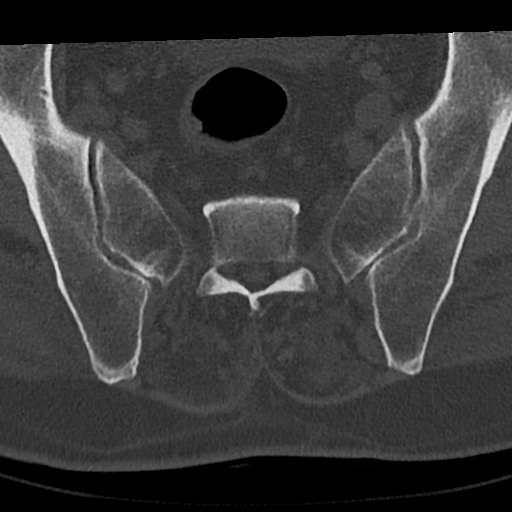
[im 61/121  bone]
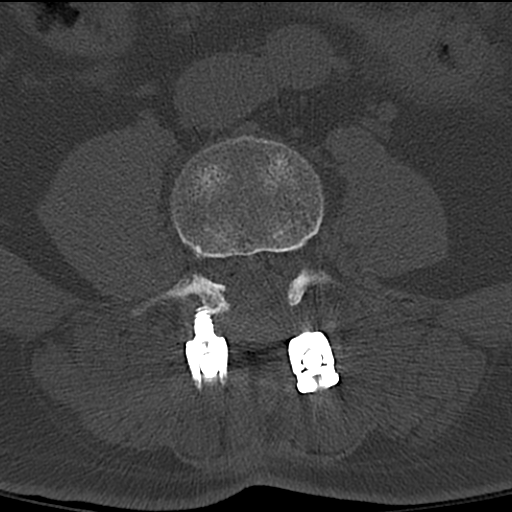
[im 121/121  bone]
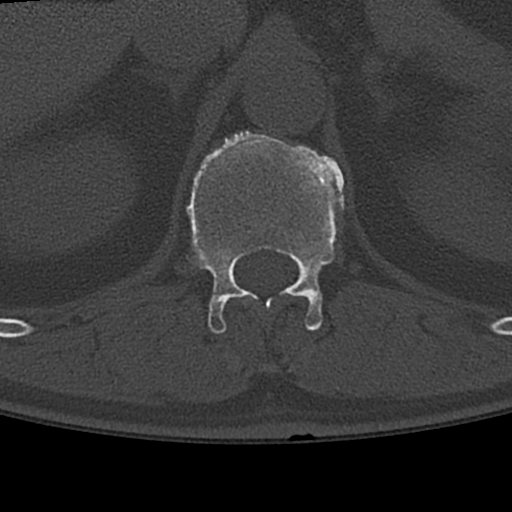

[11 of 33 positions shown; findings below may reference images not displayed]

FINDINGS: Segmentation: 5 lumbar segments. Lumbar level assignment consistent
with the prior MRI report

Alignment: Normal

Vertebrae: Negative for fracture or mass

Paraspinal and other soft tissues: Negative for paraspinous mass or
adenopathy. Mild atherosclerotic aorta.

Disc levels: T12-L1: Small right-sided disc protrusion unchanged
from the prior study. No significant stenosis.

L1-2: Mild disc bulging and mild facet degeneration. Mild spinal
stenosis unchanged from the prior MRI.

L2-3: Diffuse disc bulging with calcification in the posterior
annular fibers. Bilateral facet hypertrophy. Moderate spinal
stenosis. Mild subarticular stenosis bilaterally. No change from the
prior MRI.

L3-4: Pedicle screw and interbody fusion. Solid interbody fusion.
Posterior decompression. No significant stenosis. Hardware in good
position without loosening or failure.

L4-5: Pedicle screw and interbody fusion. Solid interbody fusion.
Posterior decompression. No significant stenosis. Hardware in good
position without loosening or failure.

L5-S1: Negative
IMPRESSION: Pedicle screw and interbody fusion L3-4 and L4-5. Solid fusion at
both levels without stenosis

Mild spinal stenosis L1-2.  Moderate spinal stenosis L2-3

No change from recent MRI

## 2023-03-09 ENCOUNTER — Other Ambulatory Visit: Payer: Self-pay | Admitting: Cardiology

## 2023-03-09 DIAGNOSIS — I10 Essential (primary) hypertension: Secondary | ICD-10-CM

## 2023-04-20 ENCOUNTER — Encounter (INDEPENDENT_AMBULATORY_CARE_PROVIDER_SITE_OTHER): Payer: Self-pay

## 2023-05-22 ENCOUNTER — Encounter: Payer: Self-pay | Admitting: Family Medicine

## 2023-05-22 ENCOUNTER — Ambulatory Visit (INDEPENDENT_AMBULATORY_CARE_PROVIDER_SITE_OTHER): Payer: Medicare Other | Admitting: Family Medicine

## 2023-05-22 VITALS — BP 120/70 | HR 87 | Temp 97.0°F | Ht 69.0 in | Wt 178.2 lb

## 2023-05-22 DIAGNOSIS — E663 Overweight: Secondary | ICD-10-CM

## 2023-05-22 DIAGNOSIS — Z131 Encounter for screening for diabetes mellitus: Secondary | ICD-10-CM

## 2023-05-22 DIAGNOSIS — M109 Gout, unspecified: Secondary | ICD-10-CM | POA: Diagnosis not present

## 2023-05-22 DIAGNOSIS — I77810 Thoracic aortic ectasia: Secondary | ICD-10-CM

## 2023-05-22 DIAGNOSIS — I10 Essential (primary) hypertension: Secondary | ICD-10-CM | POA: Diagnosis not present

## 2023-05-22 DIAGNOSIS — E785 Hyperlipidemia, unspecified: Secondary | ICD-10-CM

## 2023-05-22 DIAGNOSIS — Z125 Encounter for screening for malignant neoplasm of prostate: Secondary | ICD-10-CM | POA: Diagnosis not present

## 2023-05-22 LAB — COMPREHENSIVE METABOLIC PANEL
ALT: 18 U/L (ref 0–53)
AST: 17 U/L (ref 0–37)
Albumin: 3.7 g/dL (ref 3.5–5.2)
Alkaline Phosphatase: 69 U/L (ref 39–117)
BUN: 20 mg/dL (ref 6–23)
CO2: 27 meq/L (ref 19–32)
Calcium: 8.7 mg/dL (ref 8.4–10.5)
Chloride: 106 mEq/L (ref 96–112)
Creatinine, Ser: 0.9 mg/dL (ref 0.40–1.50)
GFR: 88.59 mL/min (ref >60.00)
Glucose, Bld: 79 mg/dL (ref 70–99)
Potassium: 3.6 meq/L (ref 3.5–5.1)
Sodium: 140 meq/L (ref 135–145)
Total Bilirubin: 0.6 mg/dL (ref 0.2–1.2)
Total Protein: 6 g/dL (ref 6.0–8.3)

## 2023-05-22 LAB — CBC WITH DIFFERENTIAL/PLATELET
Basophils Absolute: 0.1 10*3/uL (ref 0.0–0.1)
Basophils Relative: 1 % (ref 0.0–3.0)
Eosinophils Absolute: 0.2 10*3/uL (ref 0.0–0.7)
Eosinophils Relative: 3.2 % (ref 0.0–5.0)
HCT: 40.8 % (ref 39.0–52.0)
Hemoglobin: 13.3 g/dL (ref 13.0–17.0)
Lymphocytes Relative: 46.8 % — ABNORMAL HIGH (ref 12.0–46.0)
Lymphs Abs: 2.9 10*3/uL (ref 0.7–4.0)
MCHC: 32.6 g/dL (ref 30.0–36.0)
MCV: 100.1 fl — ABNORMAL HIGH (ref 78.0–100.0)
Monocytes Absolute: 0.6 10*3/uL (ref 0.1–1.0)
Monocytes Relative: 10.4 % (ref 3.0–12.0)
Neutro Abs: 2.4 10*3/uL (ref 1.4–7.7)
Neutrophils Relative %: 38.6 % — ABNORMAL LOW (ref 43.0–77.0)
Platelets: 239 10*3/uL (ref 150.0–400.0)
RBC: 4.08 Mil/uL — ABNORMAL LOW (ref 4.22–5.81)
RDW: 12.8 % (ref 11.5–15.5)
WBC: 6.1 10*3/uL (ref 4.0–10.5)

## 2023-05-22 LAB — LIPID PANEL
Cholesterol: 173 mg/dL (ref 0–200)
HDL: 52.4 mg/dL (ref >39.00)
LDL Cholesterol: 92 mg/dL (ref 0–99)
NonHDL: 120.89
Total CHOL/HDL Ratio: 3
Triglycerides: 143 mg/dL (ref 0.0–149.0)
VLDL: 28.6 mg/dL (ref 0.0–40.0)

## 2023-05-22 LAB — PSA, MEDICARE: PSA: 1.23 ng/ml (ref 0.10–4.00)

## 2023-05-22 LAB — HEMOGLOBIN A1C: Hgb A1c MFr Bld: 5.7 % (ref 4.6–6.5)

## 2023-05-22 MED ORDER — INDOMETHACIN ER 75 MG PO CPCR: 75.0000 mg | ORAL_CAPSULE | Freq: Two times a day (BID) | ORAL | 3 refills | Status: DC

## 2023-05-22 MED ORDER — COLCHICINE 0.6 MG PO TABS: 0.6000 mg | ORAL_TABLET | Freq: Every day | ORAL | 3 refills | Status: AC

## 2023-05-22 NOTE — Progress Notes (Signed)
Phone [phone] In person visit   Subjective:   Kyle Lawson is a 67 y.o. year old very pleasant male patient who presents for/with See problem oriented charting Chief Complaint  Patient presents with   Annual Exam    Not fasting.   gout flare    Pt c/o gout flare in knee, wanting combo of colchicin and endomethecin   Past Medical History-  Patient Active Problem List   Diagnosis Date Noted   Ascending aorta dilation (HCC) 11/04/2021    Priority: High   Spinal stenosis of lumbar region 09/29/2021    Priority: High   Fatty liver 05/20/2022    Priority: Medium    Essential hypertension 11/04/2021    Priority: Medium    Hyperlipidemia, unspecified 11/04/2021    Priority: Medium    Gout 02/05/2017    Priority: Medium    HNP (herniated nucleus pulposus), lumbar 01/11/2021    Priority: Low   Acromioclavicular joint pain 07/13/2019    Priority: Low   Sternum pain 01/16/2019    Priority: Low   Right anterior shoulder pain 11/08/2017    Priority: Low   Neck pain 07/04/2017    Priority: Low   Lumbar spondylosis 01/19/2017    Priority: Low   Acute bilateral low back pain with bilateral sciatica 01/19/2017    Priority: Low    Medications- reviewed and updated Current Outpatient Medications  Medication Sig Dispense Refill   allopurinol (ZYLOPRIM) 300 MG tablet TAKE 1 TABLET EVERY MORNING 90 tablet 3   losartan (COZAAR) 100 MG tablet TAKE 1 TABLET BY MOUTH EVERY DAY 90 tablet 3   Multiple Vitamin (MULTIVITAMIN WITH MINERALS) TABS tablet Take 1 tablet by mouth daily.     simvastatin (ZOCOR) 40 MG tablet Take 1 tablet (40 mg total) by mouth daily. 90 tablet 3   cyclobenzaprine (FLEXERIL) 10 MG tablet Take 1 tablet (10 mg total) by mouth 3 (three) times daily as needed for muscle spasms. (Patient not taking: Reported on 05/22/2023) 30 tablet 0   No current facility-administered medications for this visit.     Objective:  BP 120/70   Pulse 87   Temp (!) 97 F (36.1 C)    Ht 5\' 9"  (1.753 m)   Wt 178 lb 3.2 oz (80.8 kg)   SpO2 98%   BMI 26.32 kg/m  Gen: NAD, resting comfortably Oropharynx normal other than small tonsolith on the right, tympanic membrane normal  CV: RRR no murmurs rubs or gallops Lungs: CTAB no crackles, wheeze, rhonchi Abdomen: soft/nontender/nondistended/normal bowel sounds. No rebound or guarding.  Ext: no edema, right knee with edema and warmth- no redness  noted Skin: warm, dry Neuro: grossly normal, moves all extremities    Assessment and Plan   #Gout S: Medication: Allopurinol 300 mg-uric acid typically below 6 As needed: colchicine and indomethacin have helped  - right knee flare starting about a week ago. Has taken some meloxicam with minimal relief. No fall or injury A/P:in gout flare- hold off on uric acid today. Has had success with indomethacin and colchicine combo in past- sent this in for him today- if no improvement within 3-4 days may be worth seeing sports medicine or orthopedic  in case some othe rinjury  #hypertension S: medication: Losartan 100 mg A/P: stable- continue current medicines    #hyperlipidemia-CT calcium scoring of 0 in 2022 # Aortic atherosclerosis S: Medication:Simvastatin 40 mg without myalgias Lab Results  Component Value Date   CHOL 197 05/24/2022   HDL 56.80 05/24/2022  LDLCALC 113 (H) 05/24/2022   TRIG 135.0 05/24/2022   CHOLHDL 3 05/24/2022   A/P: Hyperlipidemia-prefers to come back fasting for labs Aortic atherosclerosis - hopefully  LDL improving- discussed LDL goal under 70- but we may tolerate a little higher with CT calcium reassuring  # ascending aorta dilation S:Ascending aorta noted to be dilated at 43 mm on ct cardiac scoring but only 41 mm on echocardiogram - plans to do imaging with Dr. Jens Som on yearly basis  - had 12/23/22  -avoid quinoloes A/P: has been stable- continue follow up with Dr. Jens Som and excellent blood pressure control    #History of low back pain-works  with Dr. Wynetta Emery -History of lumbar spondylosis-history of 2 back surgeries in 1984 and 1996 with laminectomies as well as more recent surgery 2023 with fusion of L2-L3  -Gabapentin -he reports stopping this  -upcoming visit with DrMarland Kitchen Wynetta Emery- has needed several injections this year x4  #Neck pain with left radiculopathy- MR cervical spine from 04/12/22- "ACDF C4-5 and C5-6 without stenosis.Left foraminal narrowing at C6-7 due to disc protrusion and spurring. Mild right foraminal narrowing C6-7." -injections and physical therapy in 2023- improved and has done much better   # Health maintenance - Weight management including diet and exercise-within 3 lbs of last year. Mostly exercise bike and weight lifting and fair amount of hiking- plans to go to gym after visit. Down 10-15 lbs from peak Wt Readings from Last 3 Encounters:  05/22/23 178 lb 3.2 oz (80.8 kg)  05/20/22 175 lb 6.4 oz (79.6 kg)  04/11/22 174 lb 12.8 oz (79.3 kg)  -recent root canal but doing well  Never smoker  -Alcohol intake 4 to 6/week  -Very sparing marijuana through [employer] for pain relief  -Cancer screening Colon cancer screening- 01/06/2022 with [facility] and plan for 5-year follow-up Lung Cancer screening- not a candidate-never smoker  Skin cancer screening- yearly with dermatology associates Dr. Sharyn Lull  Prostate cancer screening-baseline PSA around 1.2 per his report prior to seeing [name]- low risk prior trend- update psa today    Lab Results  Component Value Date   PSA 1.19 05/24/2022   Recommended follow up: Return in about 1 year (around 05/21/2024) for followup or sooner if needed.Schedule b4 you leave.  Lab/Order associations:   ICD-10-CM   1. Essential hypertension  I10     2. Hyperlipidemia, unspecified hyperlipidemia type  E78.5     3. Gout, unspecified cause, unspecified chronicity, unspecified site  M10.9     4. Ascending aorta dilation (HCC)  I77.810     5. Screening for prostate cancer  Z12.5     6.  Overweight  E66.3     7. Screening for diabetes mellitus  Z13.1       No orders of the defined types were placed in this encounter.   Return precautions advised.  Tana Conch, MD

## 2023-05-22 NOTE — Patient Instructions (Addendum)
Let me know when you get your flu and COVID vaccines at the pharmacy  Please stop by lab before you go If you have mychart- we will send your results within 3 business days of [name] receiving them.  If you do not have mychart- we will call you about results within 5 business days of [name] receiving them.  *please also note that you will see labs on mychart as soon as they post. I will later go in and write notes on them- will say "notes from Dr. Durene Cal"   Recommended follow up: Return in about 1 year (around 05/21/2024) for followup or sooner if needed.Schedule b4 you leave.

## 2023-05-23 ENCOUNTER — Other Ambulatory Visit: Payer: Self-pay | Admitting: Family Medicine

## 2023-05-23 NOTE — Telephone Encounter (Signed)
Looks like PA is required.

## 2023-05-26 ENCOUNTER — Other Ambulatory Visit (HOSPITAL_COMMUNITY): Payer: Self-pay

## 2023-05-26 NOTE — Telephone Encounter (Signed)
Picture is so small that I cannot tell-do they cover indomethacin 50 mg 3 times daily nonsustained release?

## 2023-05-26 NOTE — Telephone Encounter (Signed)
Per test claim, product is not included in pt's closed formulary, regardless of PA. Please change if appropriate or advise.

## 2023-05-26 NOTE — Telephone Encounter (Signed)
See below

## 2023-05-29 ENCOUNTER — Other Ambulatory Visit (HOSPITAL_COMMUNITY): Payer: Self-pay

## 2023-05-29 NOTE — Telephone Encounter (Signed)
No, unfortunately, I receive the same "non formulary/closed formulary" rejection that I received with the Indomethacin 75mg  CR.

## 2023-05-29 NOTE — Telephone Encounter (Signed)
See below

## 2023-06-02 ENCOUNTER — Encounter: Payer: Self-pay | Admitting: Family Medicine

## 2023-06-02 ENCOUNTER — Other Ambulatory Visit: Payer: Self-pay | Admitting: Family

## 2023-06-02 DIAGNOSIS — R7309 Other abnormal glucose: Secondary | ICD-10-CM

## 2023-06-02 MED ORDER — PREDNISONE 20 MG PO TABS: 40.0000 mg | ORAL_TABLET | Freq: Every day | ORAL | 0 refills | Status: DC

## 2023-06-08 ENCOUNTER — Other Ambulatory Visit: Payer: Self-pay | Admitting: Family

## 2023-06-08 MED ORDER — INDOMETHACIN 50 MG PO CAPS: 50.0000 mg | ORAL_CAPSULE | Freq: Three times a day (TID) | ORAL | 1 refills | Status: DC

## 2023-06-16 ENCOUNTER — Telehealth: Payer: Self-pay

## 2023-06-16 ENCOUNTER — Other Ambulatory Visit (HOSPITAL_COMMUNITY): Payer: Self-pay

## 2023-06-16 NOTE — Telephone Encounter (Signed)
Pharmacy Patient Advocate Encounter   Received notification from RX Request Messages that prior authorization for Indomethacin 50mg  caps is required/requested.   Insurance verification completed.   The patient is insured through [facility] .   Per test claim: PA required; PA submitted to [facility] via CoverMyMeds Key/confirmation #/EOC [id number] Status is pending

## 2023-06-19 ENCOUNTER — Other Ambulatory Visit (HOSPITAL_COMMUNITY): Payer: Self-pay

## 2023-06-19 NOTE — Telephone Encounter (Signed)
Pharmacy Patient Advocate Encounter  Received notification from [facility] that Prior Authorization for Indomethacin 50mg  caps  has been APPROVED from 06/16/23 to 09/04/98   PA #/Case ID/Reference #: [id number]  Approval letter indexed to media tab

## 2023-07-14 ENCOUNTER — Other Ambulatory Visit: Payer: Self-pay | Admitting: Medical Genetics

## 2023-07-14 DIAGNOSIS — Z006 Encounter for examination for normal comparison and control in clinical research program: Secondary | ICD-10-CM

## 2023-07-26 ENCOUNTER — Ambulatory Visit: Payer: Medicare Other

## 2023-07-26 VITALS — Wt 178.0 lb

## 2023-07-26 DIAGNOSIS — Z0000 Encounter for general adult medical examination without abnormal findings: Secondary | ICD-10-CM | POA: Diagnosis not present

## 2023-07-26 NOTE — Progress Notes (Signed)
Subjective:   Kyle Lawson is a 67 y.o. male who presents for Medicare Annual/Subsequent preventive examination.  Visit Complete: Virtual I connected with  [name] on 07/26/23 by a audio enabled telemedicine application and verified that I am speaking with the correct person using two identifiers.  Patient Location: Home  Provider Location: Home Office  I discussed the limitations of evaluation and management by telemedicine. The patient expressed understanding and agreed to proceed.  Vital Signs: Because this visit was a virtual/telehealth visit, some criteria may be missing or patient reported. Any vitals not documented were not able to be obtained and vitals that have been documented are patient reported.  Cardiac Risk Factors include: advanced age (>44men, >82 women);dyslipidemia;hypertension;male gender     Objective:    Today's Vitals   07/26/23 1414  Weight: 178 lb (80.7 kg)   Body mass index is 26.29 kg/m.     07/26/2023    2:18 PM 09/27/2021    9:26 AM 01/06/2021    1:21 PM 09/16/2011   12:26 PM 09/14/2011    2:29 PM 09/12/2011    8:23 AM  Advanced Directives  Does Patient Have a Medical Advance Directive? Yes Yes Yes Patient has advance directive, copy in chart Patient does not have advance directive Patient has advance directive, copy not in chart  Type of Advance Directive Healthcare Power of [place];Living will Healthcare Power of [place];Living will Healthcare Power of [place];Living will Healthcare Power of [place];Living will Healthcare Power of [employer] of [place];Living will  Does patient want to make changes to medical advance directive?  No - Patient declined No - Patient declined     Copy of Healthcare Power of Attorney in Chart? No - copy requested No - copy requested No - copy requested  Copy requested from family Copy requested from family  Pre-existing out of facility DNR order (yellow form or pink MOST form)    No No No     Current Medications (verified) Outpatient Encounter Medications as of 07/26/2023  Medication Sig   allopurinol (ZYLOPRIM) 300 MG tablet TAKE 1 TABLET EVERY MORNING   colchicine 0.6 MG tablet Take 1 tablet (0.6 mg total) by mouth daily. At start of flare- take 2 and then an hour later (max 3 day one) if not improved can take 1 more, then daily until gout flare resolves   cyclobenzaprine (FLEXERIL) 10 MG tablet Take 1 tablet (10 mg total) by mouth 3 (three) times daily as needed for muscle spasms.   indomethacin (INDOCIN) 50 MG capsule Take 1 capsule (50 mg total) by mouth 3 (three) times daily with meals.   losartan (COZAAR) 100 MG tablet TAKE 1 TABLET BY MOUTH EVERY DAY   Multiple Vitamin (MULTIVITAMIN WITH MINERALS) TABS tablet Take 1 tablet by mouth daily.   simvastatin (ZOCOR) 40 MG tablet Take 1 tablet (40 mg total) by mouth daily.   [DISCONTINUED] predniSONE (DELTASONE) 20 MG tablet Take 2 tablets (40 mg total) by mouth daily with breakfast.   No facility-administered encounter medications on file as of 07/26/2023.    Allergies (verified) Patient has no known allergies.   History: Past Medical History:  Diagnosis Date   Arthritis    Gout    History of kidney stones    Hyperlipidemia    Hypertension    Thoracic aortic aneurysm [facility]) 2022   plans for new CTA of thoracic aorta April 2023   Past Surgical History:  Procedure Laterality Date   Anterior Cervical Decompression Fusion  2019   DISTAL CLAVICLE EXCISION Right 2020   Right Shoulder Distal Clavicle Excision   LAMINECTOMY  1984   L4-L5   LAMINECTOMY  1996   L3-L4   LUMBAR FUSION  01/11/2021   may 2022- L3-L5   LUMBAR FUSION  09/29/2021   L2-L3   TOTAL HIP ARTHROPLASTY Left    left hip 09/2006   TOTAL HIP ARTHROPLASTY  09/14/2011   Right. Procedure: TOTAL HIP ARTHROPLASTY;  Surgeon: Loreta Ave, MD;  [facility];  Service: Orthopedics;  Laterality: Right;   Family History  Problem Relation Age of  Onset   Diabetes Mother        passed at 59   Skin cancer Father    Gout Father    Hypertension Father        passed at 25   Hyperlipidemia Father    CAD Brother        CABG 71   Healthy Brother    Healthy Brother    Alcoholism Brother    Depression Brother        suicide age 52   Healthy Maternal Grandmother        lived to 61   Brain cancer Maternal Grandfather        age 40- was an MD   CAD Paternal Grandmother        died in 27s   CAD Paternal Grandfather        died in 82s   Obesity Sister    Healthy Sister    Social History   Socioeconomic History   Marital status: Married    Spouse name: Not on file   Number of children: 2   Years of education: Not on file   Highest education level: Not on file  Occupational History   Not on file  Tobacco Use   Smoking status: Never   Smokeless tobacco: Never  Vaping Use   Vaping status: Never Used  Substance and Sexual Activity   Alcohol use: Not Currently    Alcohol/week: 4.0 - 5.0 standard drinks of alcohol    Types: 4 - 5 Cans of beer per week   Drug use: Yes    Types: Marijuana    Comment: for pain- very occasionally   Sexual activity: Yes    Partners: Female  Other Topics Concern   Not on file  Social History Narrative   Married 28 years in 2023. Daughter 51, son 57 in 2023.    From [place]      Retired from [employer]- enjoying retirement      Hobbies: traveling, hiking, walks daily, live music   Social Determinants of Health   Financial Resource Strain: Low Risk  (07/26/2023)   Overall Financial Resource Strain (CARDIA)    Difficulty of Paying Living Expenses: Not hard at all  Food Insecurity: No Food Insecurity (07/26/2023)   Hunger Vital Sign    Worried About Running Out of Food in the Last Year: Never true    Ran Out of Food in the Last Year: Never true  Transportation Needs: No Transportation Needs (07/26/2023)   PRAPARE - Administrator, Civil Service (Medical): No    Lack of  Transportation (Non-Medical): No  Physical Activity: Sufficiently Active (07/26/2023)   Exercise Vital Sign    Days of Exercise per Week: 5 days    Minutes of Exercise per Session: 90 min  Stress: No Stress Concern Present (07/26/2023)   [employer] of Occupational Health -  Occupational Stress Questionnaire    Feeling of Stress : Not at all  Social Connections: Moderately Isolated (07/26/2023)   Social Connection and Isolation Panel [NHANES]    Frequency of Communication with Friends and Family: More than three times a week    Frequency of Social Gatherings with Friends and Family: More than three times a week    Attends Religious Services: Never    Database administrator or Organizations: No    Attends Engineer, structural: Never    Marital Status: Married    Tobacco Counseling Counseling given: Not Answered   Clinical Intake:  Pre-visit preparation completed: Yes  Pain : No/denies pain     BMI - recorded: 26.29 Nutritional Status: BMI 25 -29 Overweight Nutritional Risks: None Diabetes: No  How often do you need to have someone help you when you read instructions, pamphlets, or other written materials from your doctor or pharmacy?: 1 - Never  Interpreter Needed?: No  Information entered by :: Lanier Ensign, LPN   Activities of Daily Living    07/26/2023    2:15 PM  In your present state of health, do you have any difficulty performing the following activities:  Hearing? 0  Vision? 0  Difficulty concentrating or making decisions? 0  Walking or climbing stairs? 0  Dressing or bathing? 0  Doing errands, shopping? 0  Preparing Food and eating ? N  Using the Toilet? N  In the past six months, have you accidently leaked urine? N  Do you have problems with loss of bowel control? N  Managing your Medications? N  Managing your Finances? N  Housekeeping or managing your Housekeeping? N    Patient Care Team: Shelva Majestic, MD as PCP - General  (Family Medicine) Marzella Schlein., MD (Ophthalmology) Sharyn Lull, Elvin So, MD as Referring Physician (Dermatology) Lewayne Bunting, MD as Consulting Physician (Cardiology)  Indicate any recent Medical Services you may have received from other than [facility] providers in the past year (date may be approximate).     Assessment:   This is a routine wellness examination for Juluis.  Hearing/Vision screen Hearing Screening - Comments:: Pt denies any hearing issue  Vision Screening - Comments:: Pt follows up with dr Joseph Art    Goals Addressed             This Visit's Progress    Patient Stated       Maintain health and activity        Depression Screen    07/26/2023    2:18 PM 05/22/2023    8:01 AM 05/20/2022    1:25 PM 04/11/2022    9:23 AM 11/04/2021    2:42 PM  PHQ 2/9 Scores  PHQ - 2 Score 0 0 0 0 0  PHQ- 9 Score  0   0    Fall Risk    07/26/2023    2:20 PM 05/22/2023    8:01 AM 05/20/2022    1:05 PM 04/11/2022    9:23 AM  Fall Risk   Falls in the past year? 0 0 0 0  Number falls in past yr: 0 0 0 0  Injury with Fall? 0 0 0 0  Risk for fall due to : No Fall Risks No Fall Risks No Fall Risks No Fall Risks  Follow up Falls prevention discussed Falls evaluation completed Falls evaluation completed     MEDICARE RISK AT HOME: Medicare Risk at Home Any stairs in or around the home?: Yes  If so, are there any without handrails?: No Home free of loose throw rugs in walkways, pet beds, electrical cords, etc?: Yes Adequate lighting in your home to reduce risk of falls?: Yes Life alert?: No Use of a cane, walker or w/c?: No Grab bars in the bathroom?: Yes Shower chair or bench in shower?: Yes Elevated toilet seat or a handicapped toilet?: No  TIMED UP AND GO:  Was the test performed?  No    Cognitive Function:        07/26/2023    2:20 PM  6CIT Screen  What Year? 0 points  What month? 0 points  What time? 0 points  Count back from 20 0 points  Months in reverse 0  points  Repeat phrase 0 points  Total Score 0 points    Immunizations Immunization History  Administered Date(s) Administered   Fluad Quad(high Dose 65+) 05/20/2022   Influenza, High Dose Seasonal PF 06/19/2021   Influenza-Unspecified 06/12/2017, 06/05/2018, 06/16/2023   PFIZER(Purple Top)SARS-COV-2 Vaccination 11/30/2019, 03/04/2020, 08/03/2020, 02/23/2021   PNEUMOCOCCAL CONJUGATE-20 11/04/2021   Pfizer Covid-19 Vaccine Bivalent Booster 53yrs & up 09/17/2021   Pfizer(Comirnaty)Fall Seasonal Vaccine 12 years and older 05/23/2023   Td 12/18/2018   Zoster Recombinant(Shingrix) 07/14/2020, 09/18/2020    TDAP status: Up to date  Flu Vaccine status: Up to date  Pneumococcal vaccine status: Up to date  Covid-19 vaccine status: Information provided on how to obtain vaccines.   Qualifies for Shingles Vaccine? Yes   Zostavax completed Yes   Shingrix Completed?: Yes  Screening Tests Health Maintenance  Topic Date Due   COVID-19 Vaccine (7 - 2023-24 season) 09/22/2023   Medicare Annual Wellness (AWV)  07/25/2024   DTaP/Tdap/Td (2 - Tdap) 12/17/2028   Colonoscopy  01/07/2032   Pneumonia Vaccine 52+ Years old  Completed   INFLUENZA VACCINE  Completed   Hepatitis C Screening  Completed   Zoster Vaccines- Shingrix  Completed   HPV VACCINES  Aged Out    Health Maintenance  There are no preventive care reminders to display for this patient.   Colorectal cancer screening: Type of screening: Colonoscopy. Completed 01/06/22. Repeat every 10 years   Additional Screening:  Hepatitis C Screening: Completed 05/24/22  Vision Screening: Recommended annual ophthalmology exams for early detection of glaucoma and other disorders of the eye. Is the patient up to date with their annual eye exam?  Yes  Who is the provider or what is the name of the office in which the patient attends annual eye exams? Dr Joseph Art  If pt is not established with a provider, would they like to be referred to a  provider to establish care? No .   Dental Screening: Recommended annual dental exams for proper oral hygiene   Community Resource Referral / Chronic Care Management: CRR required this visit?  No   CCM required this visit?  No     Plan:     I have personally reviewed and noted the following in the patient's chart:   Medical and social history Use of alcohol, tobacco or illicit drugs  Current medications and supplements including opioid prescriptions. Patient is not currently taking opioid prescriptions. Functional ability and status Nutritional status Physical activity Advanced directives List of other physicians Hospitalizations, surgeries, and ER visits in previous 12 months Vitals Screenings to include cognitive, depression, and falls Referrals and appointments  In addition, I have reviewed and discussed with patient certain preventive protocols, quality metrics, and best practice recommendations. A written personalized care plan for  preventive services as well as general preventive health recommendations were provided to patient.     Marzella Schlein, LPN   20/25/4270   After Visit Summary: (MyChart) Due to this being a telephonic visit, the after visit summary with patients personalized plan was offered to patient via MyChart   Nurse Notes: none

## 2023-07-26 NOTE — Patient Instructions (Signed)
Kyle Lawson , Thank you for taking time to come for your Medicare Wellness Visit. I appreciate your ongoing commitment to your health goals. Please review the following plan we discussed and let me know if I can assist you in the future.   Referrals/Orders/Follow-Ups/Clinician Recommendations: maintain health and activity   This is a list of the screening recommended for you and due dates:  Health Maintenance  Topic Date Due   COVID-19 Vaccine (7 - 2023-24 season) 09/22/2023   Medicare Annual Wellness Visit  07/25/2024   DTaP/Tdap/Td vaccine (2 - Tdap) 12/17/2028   Colon Cancer Screening  01/07/2032   Pneumonia Vaccine  Completed   Flu Shot  Completed   Hepatitis C Screening  Completed   Zoster (Shingles) Vaccine  Completed   HPV Vaccine  Aged Out    Advanced directives: (Copy Requested) Please bring a copy of your health care power of attorney and living will to the office to be added to your chart at your convenience.  Next Medicare Annual Wellness Visit scheduled for next year: Yes

## 2023-08-01 ENCOUNTER — Ambulatory Visit (INDEPENDENT_AMBULATORY_CARE_PROVIDER_SITE_OTHER): Payer: Medicare Other

## 2023-08-01 ENCOUNTER — Ambulatory Visit (INDEPENDENT_AMBULATORY_CARE_PROVIDER_SITE_OTHER): Payer: Medicare Other | Admitting: Sports Medicine

## 2023-08-01 VITALS — BP 124/84 | HR 84 | Ht 69.0 in | Wt 178.0 lb

## 2023-08-01 DIAGNOSIS — G8929 Other chronic pain: Secondary | ICD-10-CM

## 2023-08-01 DIAGNOSIS — M25561 Pain in right knee: Secondary | ICD-10-CM

## 2023-08-01 NOTE — Progress Notes (Signed)
[facility] [address] [place] [address] Phone: [phone]   Assessment and Plan:     1. Chronic pain of right knee -Chronic with exacerbation, initial sports medicine visit - Most consistent with flare of medial knee pain with pain concentrated around calcified medial body seen on x-ray - X-ray obtained in clinic.  My interpretation: No acute fracture or dislocation.  Mildly decreased joint space in medial compartment.  Cortical density at medial edge of knee joint space.  Possibly related to remote MCL versus meniscal injury - Patient has had course of indomethacin, colchicine, prednisone over the past 3 weeks for possible gout flares, so I do not recommend additional NSAID or prednisone p.o. treatment at this time - Patient elected for intra-articular CSI.  Tolerated well per note below.  CSI may temporarily increase blood pressure in patient with past medical history of hypertension  Procedure: Knee Joint Injection Side: Right Indication: Medial knee pain  Risks explained and consent was given verbally. The site was cleaned with alcohol prep. A needle was introduced with an anterio-lateral approach. Injection given using 2mL of 1% lidocaine without epinephrine and 1mL of kenalog 40mg /ml. This was well tolerated and resulted in symptomatic relief.  Needle was removed, hemostasis achieved, and post injection instructions were explained.   Pt was advised to call or return to clinic if these symptoms worsen or fail to improve as anticipated.    Pertinent previous records reviewed include none  Follow Up: As needed if no improvement or worsening of symptoms in 3 to 4 weeks.  Could consider ultrasound to further evaluate MCL versus medial meniscus versus SGT tendons   Subjective:   I, Kyle Lawson, am serving as a scribe for Dr. Richardean Sale  Chief Complaint: Knee pain  HPI:   08/01/23 Pain over medial aspect of R knee  for past 4 months. Was doing some home improvement yesterday and sat on his feet quite bit and was unable to sleep due to sharp pain. Tried some narcotic medications last night that did not help. Hx of partially torn MCL 2 decades ago.   Relevant Historical Information: Hypertension  Additional pertinent review of systems negative.   Current Outpatient Medications:    allopurinol (ZYLOPRIM) 300 MG tablet, TAKE 1 TABLET EVERY MORNING, Disp: 90 tablet, Rfl: 3   colchicine 0.6 MG tablet, Take 1 tablet (0.6 mg total) by mouth daily. At start of flare- take 2 and then an hour later (max 3 day one) if not improved can take 1 more, then daily until gout flare resolves, Disp: 30 tablet, Rfl: 3   cyclobenzaprine (FLEXERIL) 10 MG tablet, Take 1 tablet (10 mg total) by mouth 3 (three) times daily as needed for muscle spasms., Disp: 30 tablet, Rfl: 0   indomethacin (INDOCIN) 50 MG capsule, Take 1 capsule (50 mg total) by mouth 3 (three) times daily with meals., Disp: 90 capsule, Rfl: 1   losartan (COZAAR) 100 MG tablet, TAKE 1 TABLET BY MOUTH EVERY DAY, Disp: 90 tablet, Rfl: 3   Multiple Vitamin (MULTIVITAMIN WITH MINERALS) TABS tablet, Take 1 tablet by mouth daily., Disp: , Rfl:    simvastatin (ZOCOR) 40 MG tablet, Take 1 tablet (40 mg total) by mouth daily., Disp: 90 tablet, Rfl: 3   Objective:     Vitals:   08/01/23 1354  BP: 124/84  Pulse: 84  SpO2: 93%  Weight: 178 lb (80.7 kg)  Height: 5\' 9"  (1.753 m)  Body mass index is 26.29 kg/m.    Physical Exam:    General:  awake, alert oriented, no acute distress nontoxic Skin: no suspicious lesions or rashes Neuro:sensation intact and strength 5/5 with no deficits, no atrophy, normal muscle tone Psych: No signs of anxiety, depression or other mood disorder  Right knee: No swelling No deformity Neg fluid wave, joint milking ROM Flex 110, Ext 0 TTP medial femoral condyle, pes anserine bursa NTTP over the quad tendon,  lat fem condyle,  patella, plica, patella tendon, tibial tuberostiy, fibular head, posterior fossa,  , gerdy's tubercle, medial jt line, lateral jt line Neg anterior and posterior drawer Neg lachman Neg sag sign Negative varus stress Positive valgus stress for medial knee pain, though negative for laxity Positive McMurray for pain, negative for palpable pop Positive Thessaly for medial knee pain  Gait normal    Electronically signed by:  Kyle [facility] 4:51 PM 08/01/23

## 2023-08-09 ENCOUNTER — Other Ambulatory Visit: Payer: Self-pay | Admitting: Neurosurgery

## 2023-08-15 ENCOUNTER — Other Ambulatory Visit (HOSPITAL_COMMUNITY): Payer: Self-pay

## 2023-08-28 ENCOUNTER — Other Ambulatory Visit: Payer: Self-pay | Admitting: Family Medicine

## 2023-08-29 NOTE — Progress Notes (Signed)
Surgical Instructions   Your procedure is scheduled on Friday September 08, 2023. Report to [facility] Main Entrance "A" at 5:30 A.M., then check in with the Admitting office. Any questions or running late day of surgery: call [phone]  Questions prior to your surgery date: call [phone], Monday-Friday, 8am-4pm. If you experience any cold or flu symptoms such as cough, fever, chills, shortness of breath, etc. between now and your scheduled surgery, please notify us at the above number.     Remember:  Do not eat or drinkafter midnight the night before your surgery  Take these medicines the morning of surgery with A SIP OF WATER  allopurinol (ZYLOPRIM)  colchicine  simvastatin (ZOCOR)   May take these medicines IF NEEDED: acetaminophen (TYLENOL)  cyclobenzaprine (FLEXERIL)    One week prior to surgery, STOP taking any Aspirin (unless otherwise instructed by your surgeon) Aleve, Naproxen, Ibuprofen, Motrin, Advil, Goody's, BC's, all herbal medications, fish oil, and non-prescription vitamins.  This includes your diclofenac Sodium (PENNSAID) 2 % SOLN and your indomethacin (INDOCIN).                          Do NOT Smoke (Tobacco/Vaping) for 24 hours prior to your procedure.  If you use a CPAP at night, you may bring your mask/headgear for your overnight stay.   You will be asked to remove any contacts, glasses, piercing's, hearing aid's, dentures/partials prior to surgery. Please bring cases for these items if needed.    Patients discharged the day of surgery will not be allowed to drive home, and someone needs to stay with them for 24 hours.  SURGICAL WAITING ROOM VISITATION Patients may have no more than 2 support people in the waiting area - these visitors may rotate.   Pre-op nurse will coordinate an appropriate time for 1 ADULT support person, who may not rotate, to accompany patient in pre-op.  Children under the age of 64 must have an adult with them who is not the  patient and must remain in the main waiting area with an adult.  If the patient needs to stay at the hospital during part of their recovery, the visitor guidelines for inpatient rooms apply.  Please refer to the [facility] website for the visitor guidelines for any additional information.   If you received a COVID test during your pre-op visit  it is requested that you wear a mask when out in public, stay away from anyone that may not be feeling well and notify your surgeon if you develop symptoms. If you have been in contact with anyone that has tested positive in the last 10 days please notify you surgeon.      Pre-operative 5 CHG Bathing Instructions   You can play a key role in reducing the risk of infection after surgery. Your skin needs to be as free of germs as possible. You can reduce the number of germs on your skin by washing with CHG (chlorhexidine gluconate) soap before surgery. CHG is an antiseptic soap that kills germs and continues to kill germs even after washing.   DO NOT use if you have an allergy to chlorhexidine/CHG or antibacterial soaps. If your skin becomes reddened or irritated, stop using the CHG and notify one of our RNs at [phone].   Please shower with the CHG soap starting 4 days before surgery using the following schedule:     Please keep in mind the following:  DO NOT shave, including  legs and underarms, starting the day of your first shower.   You may shave your face at any point before/day of surgery.  Place clean sheets on your bed the day you start using CHG soap. Use a clean washcloth (not used since being washed) for each shower. DO NOT sleep with pets once you start using the CHG.   CHG Shower Instructions:  Wash your face and private area with normal soap. If you choose to wash your hair, wash first with your normal shampoo.  After you use shampoo/soap, rinse your hair and body thoroughly to remove shampoo/soap residue.  Turn the water OFF  and apply about 3 tablespoons (45 ml) of CHG soap to a CLEAN washcloth.  Apply CHG soap ONLY FROM YOUR NECK DOWN TO YOUR TOES (washing for 3-5 minutes)  DO NOT use CHG soap on face, private areas, open wounds, or sores.  Pay special attention to the area where your surgery is being performed.  If you are having back surgery, having someone wash your back for you may be helpful. Wait 2 minutes after CHG soap is applied, then you may rinse off the CHG soap.  Pat dry with a clean towel  Put on clean clothes/pajamas   If you choose to wear lotion, please use ONLY the CHG-compatible lotions on the back of this paper.   Additional instructions for the day of surgery: DO NOT APPLY any lotions, deodorants or cologne.   Do not bring valuables to the hospital. [facility] is not responsible for any belongings/valuables. Do not wear jewelry  Put on clean/comfortable clothes.  Please brush your teeth.  Ask your nurse before applying any prescription medications to the skin.     CHG Compatible Lotions   Aveeno Moisturizing lotion  Cetaphil Moisturizing Cream  Cetaphil Moisturizing Lotion  Clairol Herbal Essence Moisturizing Lotion, Dry Skin  Clairol Herbal Essence Moisturizing Lotion, Extra Dry Skin  Clairol Herbal Essence Moisturizing Lotion, Normal Skin  Curel Age Defying Therapeutic Moisturizing Lotion with Alpha Hydroxy  Curel Extreme Care Body Lotion  Curel Soothing Hands Moisturizing Hand Lotion  Curel Therapeutic Moisturizing Cream, Fragrance-Free  Curel Therapeutic Moisturizing Lotion, Fragrance-Free  Curel Therapeutic Moisturizing Lotion, Original Formula  Eucerin Daily Replenishing Lotion  Eucerin Dry Skin Therapy Plus Alpha Hydroxy Crme  Eucerin Dry Skin Therapy Plus Alpha Hydroxy Lotion  Eucerin Original Crme  Eucerin Original Lotion  Eucerin Plus Crme Eucerin Plus Lotion  Eucerin TriLipid Replenishing Lotion  Keri Anti-Bacterial Hand Lotion  Keri Deep Conditioning  Original Lotion Dry Skin Formula Softly Scented  Keri Deep Conditioning Original Lotion, Fragrance Free Sensitive Skin Formula  Keri Lotion Fast Absorbing Fragrance Free Sensitive Skin Formula  Keri Lotion Fast Absorbing Softly Scented Dry Skin Formula  Keri Original Lotion  Keri Skin Renewal Lotion Keri Silky Smooth Lotion  Keri Silky Smooth Sensitive Skin Lotion  Nivea Body Creamy Conditioning Oil  Nivea Body Extra Enriched Lotion  Nivea Body Original Lotion  Nivea Body Sheer Moisturizing Lotion Nivea Crme  Nivea Skin Firming Lotion  NutraDerm 30 Skin Lotion  NutraDerm Skin Lotion  NutraDerm Therapeutic Skin Cream  NutraDerm Therapeutic Skin Lotion  ProShield Protective Hand Cream  Provon moisturizing lotion  Please read over the following fact sheets that you were given.

## 2023-09-01 ENCOUNTER — Encounter (HOSPITAL_COMMUNITY)
Admission: RE | Admit: 2023-09-01 | Discharge: 2023-09-01 | Disposition: A | Discharge status: Home / Self Care | Payer: Medicare Other | Source: Ambulatory Visit | Attending: Neurosurgery | Admitting: Neurosurgery

## 2023-09-01 ENCOUNTER — Other Ambulatory Visit: Payer: Self-pay

## 2023-09-01 ENCOUNTER — Encounter (HOSPITAL_COMMUNITY): Payer: Self-pay

## 2023-09-01 VITALS — BP 141/83 | HR 82 | Temp 97.9°F | Resp 18 | Ht 69.0 in | Wt 180.0 lb

## 2023-09-01 DIAGNOSIS — I10 Essential (primary) hypertension: Secondary | ICD-10-CM | POA: Diagnosis not present

## 2023-09-01 DIAGNOSIS — Z01812 Encounter for preprocedural laboratory examination: Secondary | ICD-10-CM | POA: Diagnosis present

## 2023-09-01 DIAGNOSIS — E785 Hyperlipidemia, unspecified: Secondary | ICD-10-CM | POA: Diagnosis not present

## 2023-09-01 DIAGNOSIS — Z01818 Encounter for other preprocedural examination: Secondary | ICD-10-CM

## 2023-09-01 LAB — CBC
HCT: 40.2 % (ref 39.0–52.0)
Hemoglobin: 13.5 g/dL (ref 13.0–17.0)
MCH: 33 pg (ref 26.0–34.0)
MCHC: 33.6 g/dL (ref 30.0–36.0)
MCV: 98.3 fL (ref 80.0–100.0)
Platelets: 224 10*3/uL (ref 150–400)
RBC: 4.09 MIL/uL — ABNORMAL LOW (ref 4.22–5.81)
RDW: 12.2 % (ref 11.5–15.5)
WBC: 6.7 10*3/uL (ref 4.0–10.5)
nRBC: 0 % (ref 0.0–0.2)

## 2023-09-01 LAB — TYPE AND SCREEN
ABO/RH(D): B POS
Antibody Screen: NEGATIVE

## 2023-09-01 LAB — BASIC METABOLIC PANEL
Anion gap: 9 (ref 5–15)
BUN: 20 mg/dL (ref 8–23)
CO2: 27 mmol/L (ref 22–32)
Calcium: 9.5 mg/dL (ref 8.9–10.3)
Chloride: 102 mmol/L (ref 98–111)
Creatinine, Ser: 0.79 mg/dL (ref 0.61–1.24)
GFR, Estimated: >60 mL/min (ref >60)
Glucose, Bld: 95 mg/dL (ref 70–99)
Potassium: 3.6 mmol/L (ref 3.5–5.1)
Sodium: 138 mmol/L (ref 135–145)

## 2023-09-01 LAB — SURGICAL PCR SCREEN
MRSA, PCR: NEGATIVE
Staphylococcus aureus: NEGATIVE

## 2023-09-01 NOTE — Progress Notes (Signed)
PCP - Dr. Tana Conch Cardiologist - Dr. Olga Millers  PPM/ICD - Denies Device Orders - N/A Rep Notified - N/A  Chest x-ray - N/A EKG - 09/01/23 Stress Test - DENIES ECHO - 04/08/21 Cardiac Cath -DENIES   Sleep Study - DENIES CPAP - N/A  Fasting Blood Sugar - DENIES Checks Blood Sugar _____ times a day:  N/A  Last dose of GLP1 agonist-  DENIES GLP1 instructions: N/A  Blood Thinner Instructions: DENIES Aspirin Instructions:DENIES  ERAS Protcol - NPO PRE-SURGERY Ensure or G2- NONE  COVID TEST- N   Anesthesia review: Y, hypertension, thoracic aortic aneurysm,   Patient denies shortness of breath, fever, cough and chest pain at PAT appointment. Patient denies any respiratory issues at this time.    All instructions explained to the patient, with a verbal understanding of the material. Patient agrees to go over the instructions while at home for a better understanding. Patient also instructed to self quarantine after being tested for COVID-19. The opportunity to ask questions was provided.

## 2023-09-04 ENCOUNTER — Encounter (HOSPITAL_COMMUNITY): Payer: Self-pay

## 2023-09-08 ENCOUNTER — Other Ambulatory Visit: Payer: Self-pay

## 2023-09-08 ENCOUNTER — Encounter (HOSPITAL_COMMUNITY): Payer: Self-pay | Admitting: Neurosurgery

## 2023-09-08 ENCOUNTER — Ambulatory Visit (HOSPITAL_BASED_OUTPATIENT_CLINIC_OR_DEPARTMENT_OTHER): Payer: Medicare Other | Admitting: Anesthesiology

## 2023-09-08 ENCOUNTER — Ambulatory Visit (HOSPITAL_COMMUNITY): Payer: Medicare Other | Admitting: Physician Assistant

## 2023-09-08 ENCOUNTER — Encounter (HOSPITAL_COMMUNITY)
Admission: RE | Disposition: A | Discharge status: Home / Self Care | Payer: Self-pay | Source: Home / Self Care | Attending: Neurosurgery

## 2023-09-08 ENCOUNTER — Ambulatory Visit (HOSPITAL_COMMUNITY)
Admission: RE | Admit: 2023-09-08 | Discharge: 2023-09-09 | Disposition: A | Discharge status: Home / Self Care | Payer: Medicare Other | Attending: Neurosurgery | Admitting: Neurosurgery

## 2023-09-08 ENCOUNTER — Ambulatory Visit (HOSPITAL_COMMUNITY): Payer: Medicare Other

## 2023-09-08 DIAGNOSIS — M48062 Spinal stenosis, lumbar region with neurogenic claudication: Secondary | ICD-10-CM | POA: Insufficient documentation

## 2023-09-08 DIAGNOSIS — M4726 Other spondylosis with radiculopathy, lumbar region: Secondary | ICD-10-CM | POA: Insufficient documentation

## 2023-09-08 DIAGNOSIS — I10 Essential (primary) hypertension: Secondary | ICD-10-CM | POA: Insufficient documentation

## 2023-09-08 DIAGNOSIS — M1990 Unspecified osteoarthritis, unspecified site: Secondary | ICD-10-CM | POA: Insufficient documentation

## 2023-09-08 DIAGNOSIS — M5116 Intervertebral disc disorders with radiculopathy, lumbar region: Secondary | ICD-10-CM | POA: Insufficient documentation

## 2023-09-08 DIAGNOSIS — Z79899 Other long term (current) drug therapy: Secondary | ICD-10-CM | POA: Diagnosis not present

## 2023-09-08 DIAGNOSIS — M48061 Spinal stenosis, lumbar region without neurogenic claudication: Secondary | ICD-10-CM | POA: Diagnosis present

## 2023-09-08 DIAGNOSIS — I712 Thoracic aortic aneurysm, without rupture, unspecified: Secondary | ICD-10-CM | POA: Diagnosis not present

## 2023-09-08 SURGERY — POSTERIOR LUMBAR FUSION 1 LEVEL
Anesthesia: General | Site: Spine Lumbar

## 2023-09-08 MED ORDER — PHENYLEPHRINE 80 MCG/ML (10ML) SYRINGE FOR IV PUSH (FOR BLOOD PRESSURE SUPPORT)
PREFILLED_SYRINGE | INTRAVENOUS | Status: AC
  Filled 2023-09-08: qty 10

## 2023-09-08 MED ORDER — PHENYLEPHRINE 80 MCG/ML (10ML) SYRINGE FOR IV PUSH (FOR BLOOD PRESSURE SUPPORT)
PREFILLED_SYRINGE | INTRAVENOUS | Status: DC | PRN
  Administered 2023-09-08: 80 ug via INTRAVENOUS
  Administered 2023-09-08 (×4): 160 ug via INTRAVENOUS
  Administered 2023-09-08: 80 ug via INTRAVENOUS
  Administered 2023-09-08 (×2): 160 ug via INTRAVENOUS
  Administered 2023-09-08 (×2): 80 ug via INTRAVENOUS

## 2023-09-08 MED ORDER — HYDROCODONE-ACETAMINOPHEN 5-325 MG PO TABS
2.0000 | ORAL_TABLET | ORAL | Status: DC | PRN
  Administered 2023-09-08 (×2): 2 via ORAL
  Filled 2023-09-08 (×2): qty 2

## 2023-09-08 MED ORDER — BUPIVACAINE LIPOSOME 1.3 % IJ SUSP
INTRAMUSCULAR | Status: AC
  Filled 2023-09-08: qty 20

## 2023-09-08 MED ORDER — CHLORHEXIDINE GLUCONATE CLOTH 2 % EX PADS: 6.0000 | MEDICATED_PAD | Freq: Once | CUTANEOUS | Status: DC

## 2023-09-08 MED ORDER — HYDROMORPHONE HCL 1 MG/ML IJ SOLN
INTRAMUSCULAR | Status: DC | PRN
  Administered 2023-09-08 (×2): .25 mg via INTRAVENOUS

## 2023-09-08 MED ORDER — ALLOPURINOL 300 MG PO TABS
300.0000 mg | ORAL_TABLET | Freq: Every morning | ORAL | Status: DC
  Administered 2023-09-09: 300 mg via ORAL
  Filled 2023-09-08: qty 1

## 2023-09-08 MED ORDER — FENTANYL CITRATE (PF) 100 MCG/2ML IJ SOLN
25.0000 ug | INTRAMUSCULAR | Status: DC | PRN
  Administered 2023-09-08: 50 ug via INTRAVENOUS

## 2023-09-08 MED ORDER — OXYCODONE HCL 5 MG/5ML PO SOLN: 5.0000 mg | Freq: Once | ORAL | Status: DC | PRN

## 2023-09-08 MED ORDER — THROMBIN 5000 UNITS EX SOLR
CUTANEOUS | Status: AC
  Filled 2023-09-08: qty 5000

## 2023-09-08 MED ORDER — CHLORHEXIDINE GLUCONATE 0.12 % MT SOLN
15.0000 mL | Freq: Once | OROMUCOSAL | Status: AC
Start: 2023-09-08 — End: 2023-09-08
  Administered 2023-09-08: 15 mL via OROMUCOSAL
  Filled 2023-09-08: qty 15

## 2023-09-08 MED ORDER — THROMBIN 20000 UNITS EX SOLR: CUTANEOUS | Status: DC | PRN

## 2023-09-08 MED ORDER — SIMVASTATIN 20 MG PO TABS: 40.0000 mg | ORAL_TABLET | Freq: Every day | ORAL | Status: DC

## 2023-09-08 MED ORDER — OXYCODONE HCL 5 MG PO TABS: 5.0000 mg | ORAL_TABLET | Freq: Once | ORAL | Status: DC | PRN

## 2023-09-08 MED ORDER — ACETAMINOPHEN 650 MG RE SUPP: 650.0000 mg | RECTAL | Status: DC | PRN

## 2023-09-08 MED ORDER — KETAMINE HCL 50 MG/5ML IJ SOSY
PREFILLED_SYRINGE | INTRAMUSCULAR | Status: DC | PRN
  Administered 2023-09-08: 20 mg via INTRAVENOUS

## 2023-09-08 MED ORDER — KETAMINE HCL 50 MG/5ML IJ SOSY
PREFILLED_SYRINGE | INTRAMUSCULAR | Status: AC
  Filled 2023-09-08: qty 5

## 2023-09-08 MED ORDER — ORAL CARE MOUTH RINSE: 15.0000 mL | Freq: Once | OROMUCOSAL | Status: AC

## 2023-09-08 MED ORDER — HYDROMORPHONE HCL 1 MG/ML IJ SOLN
INTRAMUSCULAR | Status: AC
  Filled 2023-09-08: qty 0.5

## 2023-09-08 MED ORDER — CYCLOBENZAPRINE HCL 10 MG PO TABS
10.0000 mg | ORAL_TABLET | Freq: Three times a day (TID) | ORAL | Status: DC | PRN
  Administered 2023-09-08: 10 mg via ORAL
  Filled 2023-09-08 (×2): qty 1

## 2023-09-08 MED ORDER — PANTOPRAZOLE SODIUM 40 MG IV SOLR
40.0000 mg | Freq: Every day | INTRAVENOUS | Status: DC
Start: 2023-09-08 — End: 2023-09-08

## 2023-09-08 MED ORDER — ACETAMINOPHEN 10 MG/ML IV SOLN
1000.0000 mg | Freq: Once | INTRAVENOUS | Status: DC | PRN
  Administered 2023-09-08: 1000 mg via INTRAVENOUS

## 2023-09-08 MED ORDER — ACETAMINOPHEN 10 MG/ML IV SOLN
INTRAVENOUS | Status: AC
  Filled 2023-09-08: qty 100

## 2023-09-08 MED ORDER — DOCUSATE SODIUM 100 MG PO CAPS
100.0000 mg | ORAL_CAPSULE | Freq: Two times a day (BID) | ORAL | Status: DC | PRN
  Administered 2023-09-08: 100 mg via ORAL
  Filled 2023-09-08: qty 1

## 2023-09-08 MED ORDER — THROMBIN 20000 UNITS EX SOLR
CUTANEOUS | Status: AC
  Filled 2023-09-08: qty 20000

## 2023-09-08 MED ORDER — FENTANYL CITRATE (PF) 250 MCG/5ML IJ SOLN
INTRAMUSCULAR | Status: AC
  Filled 2023-09-08: qty 5

## 2023-09-08 MED ORDER — ONDANSETRON HCL 4 MG/2ML IJ SOLN: 4.0000 mg | Freq: Once | INTRAMUSCULAR | Status: DC | PRN

## 2023-09-08 MED ORDER — VITAMIN D 25 MCG (1000 UNIT) PO TABS: 2000.0000 [IU] | ORAL_TABLET | Freq: Every morning | ORAL | Status: DC

## 2023-09-08 MED ORDER — LIDOCAINE-EPINEPHRINE 1 %-1:100000 IJ SOLN
INTRAMUSCULAR | Status: AC
  Filled 2023-09-08: qty 1

## 2023-09-08 MED ORDER — FENTANYL CITRATE (PF) 250 MCG/5ML IJ SOLN
INTRAMUSCULAR | Status: DC | PRN
  Administered 2023-09-08: 50 ug via INTRAVENOUS
  Administered 2023-09-08: 100 ug via INTRAVENOUS
  Administered 2023-09-08 (×2): 50 ug via INTRAVENOUS

## 2023-09-08 MED ORDER — MIDAZOLAM HCL 2 MG/2ML IJ SOLN
INTRAMUSCULAR | Status: DC | PRN
  Administered 2023-09-08: 2 mg via INTRAVENOUS

## 2023-09-08 MED ORDER — METHOCARBAMOL 1000 MG/10ML IJ SOLN
INTRAMUSCULAR | Status: DC | PRN
  Administered 2023-09-08: 500 mg via INTRAVENOUS

## 2023-09-08 MED ORDER — ROCURONIUM BROMIDE 10 MG/ML (PF) SYRINGE
PREFILLED_SYRINGE | INTRAVENOUS | Status: DC | PRN
  Administered 2023-09-08: 100 mg via INTRAVENOUS
  Administered 2023-09-08: 20 mg via INTRAVENOUS

## 2023-09-08 MED ORDER — ACETAMINOPHEN 500 MG PO TABS: 500.0000 mg | ORAL_TABLET | Freq: Four times a day (QID) | ORAL | Status: DC | PRN

## 2023-09-08 MED ORDER — FENTANYL CITRATE (PF) 100 MCG/2ML IJ SOLN
INTRAMUSCULAR | Status: AC
  Filled 2023-09-08: qty 2

## 2023-09-08 MED ORDER — ALUM & MAG HYDROXIDE-SIMETH 200-200-20 MG/5ML PO SUSP: 30.0000 mL | Freq: Four times a day (QID) | ORAL | Status: DC | PRN

## 2023-09-08 MED ORDER — LIDOCAINE 2% (20 MG/ML) 5 ML SYRINGE
INTRAMUSCULAR | Status: DC | PRN
  Administered 2023-09-08: 100 mg via INTRAVENOUS

## 2023-09-08 MED ORDER — CEFAZOLIN SODIUM-DEXTROSE 2-4 GM/100ML-% IV SOLN
2.0000 g | Freq: Three times a day (TID) | INTRAVENOUS | Status: AC
  Administered 2023-09-08 – 2023-09-09 (×2): 2 g via INTRAVENOUS
  Filled 2023-09-08 (×2): qty 100

## 2023-09-08 MED ORDER — CEFAZOLIN SODIUM-DEXTROSE 2-4 GM/100ML-% IV SOLN
2.0000 g | INTRAVENOUS | Status: AC
  Administered 2023-09-08: 2 g via INTRAVENOUS
  Filled 2023-09-08: qty 100

## 2023-09-08 MED ORDER — PHENYLEPHRINE HCL-NACL 20-0.9 MG/250ML-% IV SOLN
INTRAVENOUS | Status: DC | PRN
  Administered 2023-09-08: 20 ug/min via INTRAVENOUS

## 2023-09-08 MED ORDER — BUPIVACAINE HCL (PF) 0.25 % IJ SOLN: INTRAMUSCULAR | Status: DC | PRN

## 2023-09-08 MED ORDER — BUPIVACAINE HCL (PF) 0.25 % IJ SOLN
INTRAMUSCULAR | Status: AC
  Filled 2023-09-08: qty 30

## 2023-09-08 MED ORDER — LOSARTAN POTASSIUM 50 MG PO TABS
100.0000 mg | ORAL_TABLET | Freq: Every day | ORAL | Status: DC
Start: 2023-09-08 — End: 2023-09-09

## 2023-09-08 MED ORDER — MIDAZOLAM HCL 2 MG/2ML IJ SOLN
INTRAMUSCULAR | Status: AC
  Filled 2023-09-08: qty 2

## 2023-09-08 MED ORDER — PANTOPRAZOLE SODIUM 40 MG PO TBEC
40.0000 mg | DELAYED_RELEASE_TABLET | Freq: Every day | ORAL | Status: DC
  Filled 2023-09-08: qty 1

## 2023-09-08 MED ORDER — ADULT MULTIVITAMIN W/MINERALS CH: 1.0000 | ORAL_TABLET | Freq: Every morning | ORAL | Status: DC

## 2023-09-08 MED ORDER — PROPOFOL 10 MG/ML IV BOLUS
INTRAVENOUS | Status: AC
  Filled 2023-09-08: qty 20

## 2023-09-08 MED ORDER — BUPIVACAINE LIPOSOME 1.3 % IJ SUSP
INTRAMUSCULAR | Status: DC | PRN
  Administered 2023-09-08: 20 mL

## 2023-09-08 MED ORDER — DEXAMETHASONE SODIUM PHOSPHATE 10 MG/ML IJ SOLN
INTRAMUSCULAR | Status: DC | PRN
  Administered 2023-09-08: 10 mg via INTRAVENOUS

## 2023-09-08 MED ORDER — LACTATED RINGERS IV SOLN: INTRAVENOUS | Status: DC

## 2023-09-08 MED ORDER — LIDOCAINE 2% (20 MG/ML) 5 ML SYRINGE
INTRAMUSCULAR | Status: AC
  Filled 2023-09-08: qty 5

## 2023-09-08 MED ORDER — ONDANSETRON HCL 4 MG/2ML IJ SOLN: 4.0000 mg | Freq: Four times a day (QID) | INTRAMUSCULAR | Status: DC | PRN

## 2023-09-08 MED ORDER — ACETAMINOPHEN 325 MG PO TABS
650.0000 mg | ORAL_TABLET | ORAL | Status: DC | PRN
Start: 2023-09-08 — End: 2023-09-09

## 2023-09-08 MED ORDER — SODIUM CHLORIDE 0.9 % IV SOLN
250.0000 mL | INTRAVENOUS | Status: DC
  Administered 2023-09-08: 250 mL via INTRAVENOUS

## 2023-09-08 MED ORDER — PHENOL 1.4 % MT LIQD: 1.0000 | OROMUCOSAL | Status: DC | PRN

## 2023-09-08 MED ORDER — SUGAMMADEX SODIUM 200 MG/2ML IV SOLN
INTRAVENOUS | Status: DC | PRN
  Administered 2023-09-08: 200 mg via INTRAVENOUS

## 2023-09-08 MED ORDER — SODIUM CHLORIDE 0.9% FLUSH
3.0000 mL | Freq: Two times a day (BID) | INTRAVENOUS | Status: DC
  Administered 2023-09-08 (×2): 3 mL via INTRAVENOUS

## 2023-09-08 MED ORDER — HYDROCODONE-ACETAMINOPHEN 5-325 MG PO TABS
1.0000 | ORAL_TABLET | ORAL | Status: DC | PRN
  Administered 2023-09-09 (×2): 2 via ORAL
  Filled 2023-09-08 (×4): qty 2

## 2023-09-08 MED ORDER — PROPOFOL 10 MG/ML IV BOLUS
INTRAVENOUS | Status: DC | PRN
  Administered 2023-09-08: 120 mg via INTRAVENOUS

## 2023-09-08 MED ORDER — ONDANSETRON HCL 4 MG/2ML IJ SOLN
INTRAMUSCULAR | Status: DC | PRN
  Administered 2023-09-08: 4 mg via INTRAVENOUS

## 2023-09-08 MED ORDER — ONDANSETRON HCL 4 MG/2ML IJ SOLN
INTRAMUSCULAR | Status: AC
  Filled 2023-09-08: qty 2

## 2023-09-08 MED ORDER — MENTHOL 3 MG MT LOZG: 1.0000 | LOZENGE | OROMUCOSAL | Status: DC | PRN

## 2023-09-08 MED ORDER — SUCCINYLCHOLINE CHLORIDE 200 MG/10ML IV SOSY
PREFILLED_SYRINGE | INTRAVENOUS | Status: AC
  Filled 2023-09-08: qty 10

## 2023-09-08 MED ORDER — DEXAMETHASONE SODIUM PHOSPHATE 10 MG/ML IJ SOLN
INTRAMUSCULAR | Status: AC
  Filled 2023-09-08: qty 1

## 2023-09-08 MED ORDER — SENNA 8.6 MG PO TABS
1.0000 | ORAL_TABLET | Freq: Two times a day (BID) | ORAL | Status: DC | PRN
  Administered 2023-09-08 – 2023-09-09 (×2): 8.6 mg via ORAL
  Filled 2023-09-08 (×2): qty 1

## 2023-09-08 MED ORDER — LIDOCAINE-EPINEPHRINE 1 %-1:100000 IJ SOLN
INTRAMUSCULAR | Status: DC | PRN
  Administered 2023-09-08: 10 mL

## 2023-09-08 MED ORDER — COLCHICINE 0.6 MG PO TABS
0.6000 mg | ORAL_TABLET | Freq: Every day | ORAL | Status: DC | PRN
Start: 2023-09-08 — End: 2023-09-09

## 2023-09-08 MED ORDER — HYDROMORPHONE HCL 1 MG/ML IJ SOLN: 0.5000 mg | INTRAMUSCULAR | Status: DC | PRN

## 2023-09-08 MED ORDER — SODIUM CHLORIDE 0.9% FLUSH: 3.0000 mL | INTRAVENOUS | Status: DC | PRN

## 2023-09-08 MED ORDER — ONDANSETRON HCL 4 MG PO TABS
4.0000 mg | ORAL_TABLET | Freq: Four times a day (QID) | ORAL | Status: DC | PRN
Start: 2023-09-08 — End: 2023-09-09

## 2023-09-08 MED ORDER — 0.9 % SODIUM CHLORIDE (POUR BTL) OPTIME
TOPICAL | Status: DC | PRN
  Administered 2023-09-08: 1000 mL

## 2023-09-08 MED ORDER — ALBUMIN HUMAN 5 % IV SOLN: INTRAVENOUS | Status: DC | PRN

## 2023-09-08 SURGICAL SUPPLY — 64 items
BAG COUNTER SPONGE SURGICOUNT (BAG) ×2 IMPLANT
BASKET BONE COLLECTION (BASKET) ×2 IMPLANT
BENZOIN TINCTURE PRP APPL 2/3 (GAUZE/BANDAGES/DRESSINGS) ×2 IMPLANT
BLADE BONE MILL MEDIUM (MISCELLANEOUS) ×2 IMPLANT
BLADE CLIPPER SURG (BLADE) IMPLANT
BLADE SURG 11 STRL SS (BLADE) ×2 IMPLANT
BONE VIVIGEN FORMABLE 5.4CC (Bone Implant) ×1 IMPLANT
BUR CUTTER 7.0 ROUND (BURR) ×2 IMPLANT
BUR MATCHSTICK NEURO 3.0 LAGG (BURR) ×2 IMPLANT
CANISTER SUCT 3000ML PPV (MISCELLANEOUS) ×2 IMPLANT
CAP LOCKING THREADED (Cap) IMPLANT
CNTNR URN SCR LID CUP LEK RST (MISCELLANEOUS) ×2 IMPLANT
CONNECTOR CROSSOVER 29-30MM (Connector) IMPLANT
COVER BACK TABLE 60X90IN (DRAPES) ×2 IMPLANT
DERMABOND ADVANCED .7 DNX12 (GAUZE/BANDAGES/DRESSINGS) ×2 IMPLANT
DRAPE C-ARM 42X72 X-RAY (DRAPES) ×4 IMPLANT
DRAPE C-ARMOR (DRAPES) IMPLANT
DRAPE HALF SHEET 40X57 (DRAPES) IMPLANT
DRAPE LAPAROTOMY 100X72X124 (DRAPES) ×2 IMPLANT
DRAPE SURG 17X23 STRL (DRAPES) ×2 IMPLANT
DRSG OPSITE 4X5.5 SM (GAUZE/BANDAGES/DRESSINGS) ×2 IMPLANT
DRSG OPSITE POSTOP 4X6 (GAUZE/BANDAGES/DRESSINGS) ×2 IMPLANT
DRSG OPSITE POSTOP 4X8 (GAUZE/BANDAGES/DRESSINGS) IMPLANT
DURAPREP 26ML APPLICATOR (WOUND CARE) ×2 IMPLANT
ELECT REM PT RETURN 9FT ADLT (ELECTROSURGICAL) ×1
ELECTRODE REM PT RTRN 9FT ADLT (ELECTROSURGICAL) ×2 IMPLANT
EVACUATOR 1/8 PVC DRAIN (DRAIN) IMPLANT
EVACUATOR 3/16 PVC DRAIN (DRAIN) IMPLANT
GAUZE 4X4 16PLY ~~LOC~~+RFID DBL (SPONGE) IMPLANT
GAUZE SPONGE 4X4 12PLY STRL (GAUZE/BANDAGES/DRESSINGS) ×2 IMPLANT
GLOVE BIO SURGEON STRL SZ7 (GLOVE) IMPLANT
GLOVE BIO SURGEON STRL SZ8 (GLOVE) ×4 IMPLANT
GLOVE BIOGEL PI IND STRL 7.0 (GLOVE) IMPLANT
GLOVE EXAM NITRILE XL STR (GLOVE) IMPLANT
GLOVE INDICATOR 8.5 STRL (GLOVE) ×4 IMPLANT
GOWN STRL REUS W/ TWL LRG LVL3 (GOWN DISPOSABLE) IMPLANT
GOWN STRL REUS W/ TWL XL LVL3 (GOWN DISPOSABLE) ×4 IMPLANT
GOWN STRL REUS W/TWL 2XL LVL3 (GOWN DISPOSABLE) IMPLANT
GRAFT BNE MATRIX VG FRMBL MD 5 (Bone Implant) IMPLANT
KIT BASIN OR (CUSTOM PROCEDURE TRAY) ×2 IMPLANT
KIT INFUSE XX SMALL 0.7CC (Orthopedic Implant) IMPLANT
KIT TURNOVER KIT B (KITS) ×2 IMPLANT
MILL BONE PREP (MISCELLANEOUS) ×2 IMPLANT
NDL HYPO 25X1 1.5 SAFETY (NEEDLE) ×2 IMPLANT
NEEDLE HYPO 25X1 1.5 SAFETY (NEEDLE) ×1 IMPLANT
NS IRRIG 1000ML POUR BTL (IV SOLUTION) ×2 IMPLANT
PACK LAMINECTOMY NEURO (CUSTOM PROCEDURE TRAY) ×2 IMPLANT
PAD ARMBOARD 7.5X6 YLW CONV (MISCELLANEOUS) ×6 IMPLANT
ROD 85MM SPINAL (Rod) IMPLANT
ROD SPINAL CREO 95MM (Rod) IMPLANT
SCREW PA THRD CREO TULIP 5.5X4 (Head) IMPLANT
SHAFT CREO 30MM (Neuro Prosthesis/Implant) IMPLANT
SPACER SUSTAIN TI 8X26X11 8D (Spacer) IMPLANT
SPIKE FLUID TRANSFER (MISCELLANEOUS) ×2 IMPLANT
SPONGE SURGIFOAM ABS GEL 100 (HEMOSTASIS) ×2 IMPLANT
SPONGE T-LAP 4X18 ~~LOC~~+RFID (SPONGE) IMPLANT
STRIP CLOSURE SKIN 1/2X4 (GAUZE/BANDAGES/DRESSINGS) ×4 IMPLANT
SUT VIC AB 0 CT1 18XCR BRD8 (SUTURE) ×4 IMPLANT
SUT VIC AB 2-0 CT1 18 (SUTURE) ×2 IMPLANT
SUT VIC AB 4-0 PS2 27 (SUTURE) ×2 IMPLANT
TOWEL GREEN STERILE (TOWEL DISPOSABLE) ×2 IMPLANT
TOWEL GREEN STERILE FF (TOWEL DISPOSABLE) ×2 IMPLANT
TRAY FOLEY MTR SLVR 16FR STAT (SET/KITS/TRAYS/PACK) ×2 IMPLANT
WATER STERILE IRR 1000ML POUR (IV SOLUTION) ×2 IMPLANT

## 2023-09-08 NOTE — H&P (Signed)
 Kyle Lawson is an 68 y.o. male.   Chief Complaint: Back right hip and leg pain HPI: 68 year old gentleman with progressive worsening back and bilateral hip and leg pain worse on the right workup revealed progressive and segmental degeneration above his fusion at L1-L2.  Due to the patient's progression of clinical syndrome imaging findings and failed conservative treatment I recommended posterior lumbar decompressive laminectomy and interbody fusion at L1-L2 which is an extension of his previous fusion from L2-L5.  I have extensively reviewed the risks and benefits of the operation with the patient as well as perioperative course expectations of outcome and alternatives of surgery and he understood and agreed to proceed forward.  Past Medical History:  Diagnosis Date   Arthritis    Gout    History of kidney stones    Hyperlipidemia    Hypertension    Thoracic aortic aneurysm (HCC) 2022   stable 4cm by CTA 12/2022    Past Surgical History:  Procedure Laterality Date   Anterior Cervical Decompression Fusion  2019   DISTAL CLAVICLE EXCISION Right 2020   Right Shoulder Distal Clavicle Excision   LAMINECTOMY  1984   L4-L5   LAMINECTOMY  1996   L3-L4   LUMBAR FUSION  01/11/2021   may 2022- L3-L5   LUMBAR FUSION  09/29/2021   L2-L3   TOTAL HIP ARTHROPLASTY Left    left hip 09/2006   TOTAL HIP ARTHROPLASTY  09/14/2011   Right. Procedure: TOTAL HIP ARTHROPLASTY;  Surgeon: Toribio JULIANNA Chancy, MD;  Location: [facility] OR;  Service: Orthopedics;  Laterality: Right;    Family History  Problem Relation Age of Onset   Diabetes Mother        passed at 103   Skin cancer Father    Gout Father    Hypertension Father        passed at 55   Hyperlipidemia Father    CAD Brother        CABG 74   Healthy Brother    Healthy Brother    Alcoholism Brother    Depression Brother        suicide age 62   Healthy Maternal Grandmother        lived to 104   Brain cancer Maternal Grandfather        age 66- was an  MD   CAD Paternal Grandmother        died in 62s   CAD Paternal Grandfather        died in 65s   Obesity Sister    Healthy Sister    Social History:  reports that he has never smoked. He has never used smokeless tobacco. He reports that he does not currently use alcohol after a past usage of about 4.0 - 5.0 standard drinks of alcohol per week. He reports current drug use. Drug: Marijuana.  Allergies: No Known Allergies  Medications Prior to Admission  Medication Sig Dispense Refill   acetaminophen  (TYLENOL ) 500 MG tablet Take 500 mg by mouth every 6 (six) hours as needed (pain.).     allopurinol  (ZYLOPRIM ) 300 MG tablet TAKE 1 TABLET EVERY MORNING 90 tablet 3   Cholecalciferol (VITAMIN D -3 PO) Take 2,000 Units by mouth in the morning.     cyclobenzaprine  (FLEXERIL ) 10 MG tablet Take 1 tablet (10 mg total) by mouth 3 (three) times daily as needed for muscle spasms. 30 tablet 0   diclofenac  Sodium (PENNSAID ) 2 % SOLN Apply 1-2 Pump topically 2 (two) times daily as  needed (pain.).     indomethacin  (INDOCIN ) 50 MG capsule Take 1 capsule (50 mg total) by mouth 3 (three) times daily with meals. (Patient taking differently: Take 50 mg by mouth 3 (three) times daily as needed (pain.).) 90 capsule 1   losartan  (COZAAR ) 100 MG tablet TAKE 1 TABLET BY MOUTH EVERY DAY 90 tablet 3   Multiple Vitamin (MULTIVITAMIN WITH MINERALS) TABS tablet Take 1 tablet by mouth in the morning.     simvastatin  (ZOCOR ) 40 MG tablet TAKE 1 TABLET DAILY 90 tablet 3   colchicine  0.6 MG tablet Take 1 tablet (0.6 mg total) by mouth daily. At start of flare- take 2 and then an hour later (max 3 day one) if not improved can take 1 more, then daily until gout flare resolves (Patient taking differently: Take 0.6 mg by mouth daily as needed (gout attacks).) 30 tablet 3    No results found for this or any previous visit (from the past 48 hours). No results found.  Review of Systems  Musculoskeletal:  Positive for back pain.   Neurological:  Positive for numbness.    Pulse 75, temperature 98 F (36.7 C), temperature source Oral, resp. rate 18, height 5' 9 (1.753 m), weight 78.5 kg. Physical Exam HENT:     Head: Normocephalic.     Right Ear: Tympanic membrane normal.     Nose: Nose normal.     Mouth/Throat:     Mouth: Mucous membranes are moist.  Eyes:     Pupils: Pupils are equal, round, and reactive to light.  Cardiovascular:     Rate and Rhythm: Normal rate.     Pulses: Normal pulses.  Pulmonary:     Effort: Pulmonary effort is normal.  Abdominal:     General: Abdomen is flat.  Musculoskeletal:        General: Normal range of motion.     Cervical back: Normal range of motion.  Skin:    General: Skin is warm.  Neurological:     Mental Status: He is alert.     Comments: Strength is 5-5 iliopsoas, quads, hamstrings, gastrosoleus tibialis, and EHL.      Assessment/Plan 68 year old presents for decompressive laminectomy interbody fusion L1-L2  [name], MD 09/08/2023, 7:18 AM

## 2023-09-08 NOTE — Anesthesia Preprocedure Evaluation (Signed)
 Anesthesia Evaluation  Patient identified by MRN, date of birth, ID band Patient awake    Reviewed: Allergy & Precautions, NPO status , Patient's Chart, lab work & pertinent test results  History of Anesthesia Complications Negative for: history of anesthetic complications  Airway Mallampati: I  TM Distance: >3 FB     Dental no notable dental hx.    Pulmonary neg sleep apnea, neg COPD   breath sounds clear to auscultation       Cardiovascular hypertension, (-) angina (-) CAD, (-) Past MI and (-) Cardiac Stents  Rhythm:Regular Rate:Normal     Neuro/Psych neg Seizures  Neuromuscular disease    GI/Hepatic ,neg GERD  ,,(+) neg Cirrhosis        Endo/Other    Renal/GU Renal disease     Musculoskeletal  (+) Arthritis ,    Abdominal   Peds  Hematology   Anesthesia Other Findings   Reproductive/Obstetrics                             Anesthesia Physical Anesthesia Plan  ASA: 2  Anesthesia Plan: General   Post-op Pain Management:    Induction: Intravenous  PONV Risk Score and Plan: 2 and Ondansetron  and Dexamethasone   Airway Management Planned: Oral ETT  Additional Equipment:   Intra-op Plan:   Post-operative Plan: Extubation in OR  Informed Consent: I have reviewed the patients History and Physical, chart, labs and discussed the procedure including the risks, benefits and alternatives for the proposed anesthesia with the patient or authorized representative who has indicated his/her understanding and acceptance.     Dental advisory given  Plan Discussed with: CRNA  Anesthesia Plan Comments:        Anesthesia Quick Evaluation

## 2023-09-08 NOTE — Op Note (Signed)
 Preoperative diagnosis: Lumbar spinal stenosis degenerative disc disease segmental instability L1-L2 with back pain and bilateral L1-L2 radiculopathies.  Postop diagnosis: Same.  Procedure: #1 decompressive lumbar laminectomy with complete medial facetectomies radical foraminotomies and removal of the pars interarticularis for decompression of the L1-L2 nerve roots in excess and requiring more work than would be needed with a standard interbody fusion.  2.  Posterior lumbar interbody fusion utilizing the globus insert and rotate titanium cages packed with locally harvested autograft mixed with Vivigen and BMP.  3.  Cortical screw fixation L1-L2 utilizing the globus Creo amp modular cortical screw set with new screws placed at L1 tying into previous screws placed at L2 and L3 and removal of previous L for L5 screws.  4.  Posterolateral arthrodesis L1-L2 utilizing the locally harvested autograft mixed with BMP and Vivigen.  Surgeon: Arley helling.  Assistant: Dr. Gerldine Maizes.  Assistant to: Suzen Click.  Anesthesia: General.  EBL: Minimal.  HPI: 68 year old gentleman progressive worsening back bilateral hip and leg pain workup revealed progressive and segmental degeneration above his previous L2-L5 fusion at L1-L2.  Due to his progression of clinical syndrome imaging findings failed conservative treatment I recommended decompression stabilization procedure at that level.  I extensively reviewed the risks and benefits of the operation with the patient as well as perioperative course expectations of outcome and alternatives to surgery and he understood and agreed to proceed forward.  Operative procedure: Patient was brought into the OR was induced under general anesthesia positioned prone on the Wilson frame his back was prepped and draped in routine sterile fashion.  His old incision was identified marked extended slightly cephalad and after infiltration of 10 cc lidocaine  with epi was  opened up.  Scar tissue was dissected free exposing the previous fusion construct at L2-L5 which did appear to be solid.  I then remove the knots remove the rods and removed bilateral L4-L5 screws.  I left the screws at L2 and L3 to tie into and share the load at the fusion at L1-L2.  I then remove the spinous process at L1 and performed a central decompression with complete medial facetectomies remove the pars interarticularis to complete the full facetectomy and aggressively under bit the superior tickling facet there was marked hourglass compression of thecal sac there was severe compression of both L1 nerve roots coming off the superior tickling facet due to the degenerative collapse and the facets were noted to be incompetent consistent with instability.  After I gained access to the lateral margin of the disc base to space was incised cleaned out bilaterally utilizing sequential distraction I cleaned out the disc base movement prepared the endplates and placed cages bilaterally with an extensive amount of autograft in the cages and medially between the cages.  This opened up the disc space helped reduce the deformity helped decompress the L1-L2 foramina.  Then placed 2 cortical screws at L1 under fluoroscopy they all in good purchase and then were confirmed to be in good position postop imaging confirmed all the implants to be in good position the wound was then copiously irrigated meticulous hemostasis was maintained Gelfoam was awaiting top of the dura after I inspected all the foramina to confirm patency and no migration of graft material then medium Hemovac drain was placed Exparel  was injected in the fascia I did compress L1 against L2 and placed a cross-link.  Close the wound in layers with active Vicryl running 4 subcuticular in the skin Dermabond benzoin Steri-Strips and a sterile  dressing was applied patient recovery in stable condition.  At the end the case all needle counts and sponge counts were  correct.

## 2023-09-08 NOTE — Transfer of Care (Signed)
 Immediate Anesthesia Transfer of Care Note  Patient: Kyle Lawson  Procedure(s) Performed: Posterior Lumbar Interbody Fusion, Lumbar One-Lumbar Two Extension with Removal of Hardware-Interbody Fusion (Spine Lumbar)  Patient Location: PACU  Anesthesia Type:General  Level of Consciousness: awake, alert , and oriented  Airway & Oxygen Therapy: Patient Spontanous Breathing and Patient connected to nasal cannula oxygen  Post-op Assessment: Report given to RN and Post -op Vital signs reviewed and stable  Post vital signs: Reviewed and stable  Last Vitals:  Vitals Value Taken Time  BP 96/62 09/08/23 1100  Temp 36.6 C 09/08/23 1052  Pulse 79 09/08/23 1101  Resp 16 09/08/23 1101  SpO2 96 % 09/08/23 1101  Vitals shown include unfiled device data.  Last Pain:  Vitals:   09/08/23 0607  TempSrc:   PainSc: 3       Patients Stated Pain Goal: 1 (09/08/23 9392)  Complications: No notable events documented.

## 2023-09-08 NOTE — Anesthesia Postprocedure Evaluation (Signed)
 Anesthesia Post Note  Patient: Kyle Lawson  Procedure(s) Performed: Posterior Lumbar Interbody Fusion, Lumbar One-Lumbar Two Extension with Removal of Hardware-Interbody Fusion (Spine Lumbar)     Patient location during evaluation: PACU Anesthesia Type: General Level of consciousness: awake and alert Pain management: pain level controlled Vital Signs Assessment: post-procedure vital signs reviewed and stable Respiratory status: spontaneous breathing, nonlabored ventilation, respiratory function stable and patient connected to nasal cannula oxygen Cardiovascular status: blood pressure returned to baseline and stable Postop Assessment: no apparent nausea or vomiting Anesthetic complications: no   No notable events documented.  Last Vitals:  Vitals:   09/08/23 1217 09/08/23 1530  BP: 123/78 132/78  Pulse: 76 77  Resp: 18 16  Temp: 36.4 C 36.6 C  SpO2: 92% 98%    Last Pain:  Vitals:   09/08/23 1530  TempSrc: Oral  PainSc:                  Lynwood MARLA Cornea

## 2023-09-08 NOTE — Plan of Care (Signed)

## 2023-09-08 NOTE — Anesthesia Procedure Notes (Signed)
 Procedure Name: Intubation Date/Time: 09/08/2023 7:41 AM  Performed by: Fadumo Heng J, CRNAPre-anesthesia Checklist: Patient identified, Emergency Drugs available, Suction available and Patient being monitored Patient Re-evaluated:Patient Re-evaluated prior to induction Oxygen Delivery Method: Circle System Utilized Preoxygenation: Pre-oxygenation with 100% oxygen Induction Type: IV induction Ventilation: Mask ventilation without difficulty Laryngoscope Size: Mac and 4 Grade View: Grade I Tube type: Oral Tube size: 7.5 mm Number of attempts: 1 Airway Equipment and Method: Stylet and Oral airway Placement Confirmation: ETT inserted through vocal cords under direct vision, positive ETCO2 and breath sounds checked- equal and bilateral Secured at: 24 cm Tube secured with: Tape Dental Injury: Teeth and Oropharynx as per pre-operative assessment

## 2023-09-09 DIAGNOSIS — M48061 Spinal stenosis, lumbar region without neurogenic claudication: Secondary | ICD-10-CM | POA: Diagnosis not present

## 2023-09-09 MED ORDER — HYDROCODONE-ACETAMINOPHEN 5-325 MG PO TABS: 1.0000 | ORAL_TABLET | Freq: Four times a day (QID) | ORAL | 0 refills | Status: DC | PRN

## 2023-09-09 MED ORDER — MAGNESIUM CITRATE PO SOLN
1.0000 | Freq: Once | ORAL | Status: AC
  Administered 2023-09-09: 1 via ORAL
  Filled 2023-09-09: qty 296

## 2023-09-09 NOTE — Discharge Summary (Signed)
 Physician Discharge Summary  Patient ID: Kyle Lawson MRN: [id number] DOB/AGE: 68-Jun-1957 67 y.o.  Admit date: 09/08/2023 Discharge date: 09/09/2023  Admission Diagnoses: Spondylosis and stenosis L1-L2 with lumbar radiculopathy neurogenic claudication  Discharge Diagnoses: Spondylosis and stenosis L1-L2 with lumbar radiculopathy neurogenic claudication Principal Problem:   Spinal stenosis of lumbar region   Discharged Condition: good  Hospital Course: Patient was admitted for surgery which she tolerated well  Consults: None  Significant Diagnostic Studies: None  Treatments: surgery: See op note  Discharge Exam: Blood pressure 130/80, pulse 89, temperature (!) 97.5 F (36.4 C), temperature source Oral, resp. rate 20, height 5' 9 (1.753 m), weight 78.5 kg, SpO2 100%. Incision/Wound: Incision is clean and dry. Station and gait are intact.  Disposition: Discharge disposition: 01-Home or Self Care       Discharge Instructions     Call MD for:  redness, tenderness, or signs of infection (pain, swelling, redness, odor or green/yellow discharge around incision site)   Complete by: As directed    Call MD for:  severe uncontrolled pain   Complete by: As directed    Call MD for:  temperature >100.4   Complete by: As directed    Diet - low sodium heart healthy   Complete by: As directed    Increase activity slowly   Complete by: As directed       Allergies as of 09/09/2023   No Known Allergies      Medication List     TAKE these medications    acetaminophen  500 MG tablet Commonly known as: TYLENOL  Take 500 mg by mouth every 6 (six) hours as needed (pain.).   allopurinol  300 MG tablet Commonly known as: ZYLOPRIM  TAKE 1 TABLET EVERY MORNING   colchicine  0.6 MG tablet Take 1 tablet (0.6 mg total) by mouth daily. At start of flare- take 2 and then an hour later (max 3 day one) if not improved can take 1 more, then daily until gout flare resolves   cyclobenzaprine  10  MG tablet Commonly known as: FLEXERIL  Take 1 tablet (10 mg total) by mouth 3 (three) times daily as needed for muscle spasms.   HYDROcodone -acetaminophen  5-325 MG tablet Commonly known as: NORCO/VICODIN Take 1-2 tablets by mouth every 6 (six) hours as needed for severe pain (pain score 7-10) or moderate pain (pain score 4-6).   indomethacin  50 MG capsule Commonly known as: INDOCIN  Take 1 capsule (50 mg total) by mouth 3 (three) times daily with meals. What changed:  when to take this reasons to take this   losartan  100 MG tablet Commonly known as: COZAAR  TAKE 1 TABLET BY MOUTH EVERY DAY   multivitamin with minerals Tabs tablet Take 1 tablet by mouth in the morning.   Pennsaid  2 % Soln Generic drug: diclofenac  Sodium Apply 1-2 Pump topically 2 (two) times daily as needed (pain.).   simvastatin  40 MG tablet Commonly known as: ZOCOR  TAKE 1 TABLET DAILY   VITAMIN D -3 PO Take 2,000 Units by mouth in the morning.         Signed: [name] 09/09/2023, 7:32 AM

## 2023-09-09 NOTE — Care Management (Signed)
 Patient with order to DC to home today. Unit staff to provide DME needed for home.   No HH needs identified Patient will have family/ friends provide transportation home. No other TOC needs identified for DC

## 2023-09-09 NOTE — Progress Notes (Signed)
 Patient alert and oriented, mae's well, voiding adequate amount of urine, swallowing without difficulty, no c/o pain at time of discharge. Patient discharged home with family. Script and discharged instructions given to patient. Patient and family stated understanding of instructions given. Patient has an appointment with Dr. Wynetta Emery

## 2023-09-09 NOTE — Evaluation (Signed)
 Physical Therapy Brief Evaluation and Discharge Note Patient Details Name: Kyle Lawson MRN: [id number] DOB: 1956/03/12 Today's Date: 09/09/2023   History of Present Illness  The pt is a 68 yo male presenting for PLIF of L1-2 on 1/3 due to chronic back and R hip pain. PMH includes: arthritis, gout, HLD, HTN, thoracic aortic aneurysm, multiple back and neck surgeries (L4-5, L3-4, L2-3), and bilateral THA.   Clinical Impression  Pt in bed upon arrival of PT, agreeable to evaluation at this time. Prior to admission the pt was active, independent, but experiencing increased pain. The pt lives in a home with his wife with 2 steps to enter and 15 steps to reach bed/bathroom. The pt was able to demo good independence with sit-stand transfers, hallway ambulation, and complete 10 stairs with use of rail and supervision for safety. We discussed activity recommendations, spinal precautions, and gradual progression back to full activity at length this session. Pt able to demo good adherence to back precautions even with mobility. The pt is safe to return home with family support, no further acute PT needs at this time, thank you for the consult.     PT Assessment Patient does not need any further PT services  Assistance Needed at Discharge  PRN    Equipment Recommendations None recommended by PT  Recommendations for Other Services       Precautions/Restrictions Precautions Precautions: Back Precaution Booklet Issued: Yes (comment) Required Braces or Orthoses: Spinal Brace Spinal Brace: Lumbar corset;Applied in sitting position Restrictions Weight Bearing Restrictions Per Provider Order: No        Mobility  Bed Mobility       General bed mobility comments: OOB upon arrival, discussed log roll verbally  Transfers Overall transfer level: Independent                 General transfer comment: no DME, no instability    Ambulation/Gait Ambulation/Gait assistance: Independent Gait  Distance (Feet): 400 Feet Assistive device: None Gait Pattern/deviations: WFL(Within Functional Limits) Gait Speed: Pace WFL    Home Activity Instructions Home Activity Instructions: progressive walking program  Stairs Stairs: Yes Stairs assistance: Supervision Stair Management: One rail Right, Alternating pattern, Forwards Number of Stairs: 10    Modified Rankin (Stroke Patients Only)        Balance Overall balance assessment: No apparent balance deficits (not formally assessed)                        Pertinent Vitals/Pain PT - Brief Vital Signs All Vital Signs Stable: Yes Pain Assessment Pain Assessment: No/denies pain     Home Living Family/patient expects to be discharged to:: Private residence Living Arrangements: Spouse/significant other Available Help at Discharge: Family;Available 24 hours/day Home Environment: Stairs to enter;Stairs in home;Rail - right;Rail - left  Stairs-Number of Steps: 2 to enter, 15 to bed/bath Home Equipment: Shower seat;Grab bars - toilet;Grab bars - tub/shower;Hand held shower head        Prior Function Level of Independence: Independent Comments: experiencing pain but still active, exercising 4days/week. independent. no falls    UE/LE Assessment   UE ROM/Strength/Tone/Coordination: WFL    LE ROM/Strength/Tone/Coordination: WFL (no focal weakness or numbness this morning)      Communication   Communication Communication: No apparent difficulties Cueing Techniques: Verbal cues     Cognition Overall Cognitive Status: Appears within functional limits for tasks assessed/performed       General Comments General comments (skin integrity, edema, etc.): VSS  Exercises Other Exercises Other Exercises: seated HS stretch Other Exercises: standing calf raises   Assessment/Plan       PT Visit Diagnosis Pain    No Skilled PT All education completed;Patient is supervision for all activity/mobility;Patient will  have necessary level of assist by caregiver at discharge    AMPAC 6 Clicks Help needed turning from your back to your side while in a flat bed without using bedrails?: None Help needed moving from lying on your back to sitting on the side of a flat bed without using bedrails?: None Help needed moving to and from a bed to a chair (including a wheelchair)?: None Help needed standing up from a chair using your arms (e.g., wheelchair or bedside chair)?: None Help needed to walk in hospital room?: None Help needed climbing 3-5 steps with a railing? : None 6 Click Score: 24      End of Session Equipment Utilized During Treatment: Back brace Activity Tolerance: Patient tolerated treatment well Patient left: Other (comment) (ambulating in hallway) Nurse Communication: Mobility status PT Visit Diagnosis: Pain Pain - Right/Left: Right Pain - part of body:  (back)     Time: 9246-9180 PT Time Calculation (min) (ACUTE ONLY): 26 min  Charges:   PT Evaluation $PT Eval Low Complexity: 1 Low PT Treatments $Self Care/Home Management: 8-22    Izetta Call, PT, DPT   Acute Rehabilitation Department Office [phone] Secure Chat Communication Preferred  Izetta JULIANNA Call  09/09/2023, 9:08 AM

## 2023-09-09 NOTE — Plan of Care (Signed)
 Problem: Education: Goal: Knowledge of General Education information will improve Description: Including pain rating scale, medication(s)/side effects and non-pharmacologic comfort measures 09/09/2023 0803 by Sherleen Flor, RN Outcome: Completed/Met 09/08/2023 1841 by Sherleen Flor, RN Outcome: Progressing   Problem: Health Behavior/Discharge Planning: Goal: Ability to manage health-related needs will improve 09/09/2023 0803 by Sherleen Flor, RN Outcome: Completed/Met 09/08/2023 1841 by Sherleen Flor, RN Outcome: Progressing   Problem: Clinical Measurements: Goal: Ability to maintain clinical measurements within normal limits will improve 09/09/2023 0803 by Sherleen Flor, RN Outcome: Completed/Met 09/08/2023 1841 by Sherleen Flor, RN Outcome: Progressing Goal: Will remain free from infection 09/09/2023 0803 by Sherleen Flor, RN Outcome: Completed/Met 09/08/2023 1841 by Sherleen Flor, RN Outcome: Progressing Goal: Diagnostic test results will improve 09/09/2023 0803 by Sherleen Flor, RN Outcome: Completed/Met 09/08/2023 1841 by Sherleen Flor, RN Outcome: Progressing Goal: Respiratory complications will improve 09/09/2023 0803 by Sherleen Flor, RN Outcome: Completed/Met 09/08/2023 1841 by Sherleen Flor, RN Outcome: Progressing Goal: Cardiovascular complication will be avoided 09/09/2023 0803 by Sherleen Flor, RN Outcome: Completed/Met 09/08/2023 1841 by Sherleen Flor, RN Outcome: Progressing   Problem: Activity: Goal: Risk for activity intolerance will decrease 09/09/2023 0803 by Sherleen Flor, RN Outcome: Completed/Met 09/08/2023 1841 by Sherleen Flor, RN Outcome: Progressing   Problem: Nutrition: Goal: Adequate nutrition will be maintained 09/09/2023 0803 by Sherleen Flor, RN Outcome: Completed/Met 09/08/2023 1841 by Sherleen Flor, RN Outcome: Progressing   Problem: Coping: Goal: Level of anxiety will decrease 09/09/2023 0803 by Sherleen Flor, RN Outcome: Completed/Met 09/08/2023 1841 by Sherleen Flor, RN Outcome: Progressing   Problem: Elimination: Goal: Will not experience complications related to bowel motility 09/09/2023 0803 by Sherleen Flor, RN Outcome: Completed/Met 09/08/2023 1841 by Sherleen Flor, RN Outcome: Progressing Goal: Will not experience complications related to urinary retention 09/09/2023 0803 by Sherleen Flor, RN Outcome: Completed/Met 09/08/2023 1841 by Sherleen Flor, RN Outcome: Progressing   Problem: Pain Management: Goal: General experience of comfort will improve 09/09/2023 0803 by Sherleen Flor, RN Outcome: Completed/Met 09/08/2023 1841 by Sherleen Flor, RN Outcome: Progressing   Problem: Safety: Goal: Ability to remain free from injury will improve 09/09/2023 0803 by Sherleen Flor, RN Outcome: Completed/Met 09/08/2023 1841 by Sherleen Flor, RN Outcome: Progressing   Problem: Skin Integrity: Goal: Risk for impaired skin integrity will decrease 09/09/2023 0803 by Sherleen Flor, RN Outcome: Completed/Met 09/08/2023 1841 by Sherleen Flor, RN Outcome: Progressing   Problem: Education: Goal: Ability to verbalize activity precautions or restrictions will improve 09/09/2023 0803 by Sherleen Flor, RN Outcome: Completed/Met 09/08/2023 1841 by Sherleen Flor, RN Outcome: Progressing Goal: Knowledge of the prescribed therapeutic regimen will improve 09/09/2023 0803 by Sherleen Flor, RN Outcome: Completed/Met 09/08/2023 1841 by Sherleen Flor, RN Outcome: Progressing Goal: Understanding of discharge needs will improve 09/09/2023 0803 by Sherleen Flor, RN Outcome: Completed/Met 09/08/2023 1841 by Sherleen Flor, RN Outcome: Progressing   Problem: Activity: Goal: Ability to avoid complications of mobility impairment will improve 09/09/2023 0803 by Sherleen Flor, RN Outcome: Completed/Met 09/08/2023 1841 by Sherleen Flor, RN Outcome: Progressing Goal: Ability  to tolerate increased activity will improve 09/09/2023 0803 by Sherleen Flor, RN Outcome: Completed/Met 09/08/2023 1841 by Sherleen Flor, RN Outcome: Progressing Goal: Will remain free from falls 09/09/2023 0803 by Sherleen Flor, RN Outcome: Completed/Met 09/08/2023 1841 by Sherleen Flor, RN Outcome: Progressing   Problem: Bowel/Gastric: Goal: Gastrointestinal status for postoperative course will improve 09/09/2023 0803 by Sherleen Flor, RN Outcome: Completed/Met 09/08/2023 1841 by Sherleen Flor, RN Outcome: Progressing   Problem: Clinical Measurements: Goal: Ability to maintain clinical measurements  within normal limits will improve 09/09/2023 0803 by Sherleen Flor, RN Outcome: Completed/Met 09/08/2023 1841 by Sherleen Flor, RN Outcome: Progressing Goal: Postoperative complications will be avoided or minimized 09/09/2023 0803 by Sherleen Flor, RN Outcome: Completed/Met 09/08/2023 1841 by Sherleen Flor, RN Outcome: Progressing Goal: Diagnostic test results will improve 09/09/2023 0803 by Sherleen Flor, RN Outcome: Completed/Met 09/08/2023 1841 by Sherleen Flor, RN Outcome: Progressing   Problem: Pain Management: Goal: Pain level will decrease 09/09/2023 0803 by Sherleen Flor, RN Outcome: Completed/Met 09/08/2023 1841 by Sherleen Flor, RN Outcome: Progressing   Problem: Skin Integrity: Goal: Will show signs of wound healing 09/09/2023 0803 by Sherleen Flor, RN Outcome: Completed/Met 09/08/2023 1841 by Sherleen Flor, RN Outcome: Progressing   Problem: Health Behavior/Discharge Planning: Goal: Identification of resources available to assist in meeting health care needs will improve 09/09/2023 0803 by Sherleen Flor, RN Outcome: Completed/Met 09/08/2023 1841 by Sherleen Flor, RN Outcome: Progressing   Problem: Bladder/Genitourinary: Goal: Urinary functional status for postoperative course will improve 09/09/2023 0803 by Sherleen Flor,  RN Outcome: Completed/Met 09/08/2023 1841 by Sherleen Flor, RN Outcome: Progressing

## 2023-09-09 NOTE — Discharge Instructions (Signed)
Wound Care Keep incision covered and dry until post op day 3. You may remove the Honeycomb dressing on post op day 3. Leave steri-strips on back.  They will fall off by themselves. Do not put any creams, lotions, or ointments on incision. You are fine to shower. Let water run over incision and pat dry.  Activity Walk each and every day, increasing distance each day. No lifting greater than 8 lbs.  No lifting no bending no twisting no driving or riding a car unless coming back and forth to see the doctor. If provided with back brace, wear when out of bed.  It is not necessary to wear brace in bed.  Diet Resume your normal diet.     Call Your Doctor If Any of These Occur Redness, drainage, or swelling at the wound.  Temperature greater than 101 degrees. Severe pain not relieved by pain medication. Incision starts to come apart.  Follow Up Appt Call [phone] if you have one or any problem.

## 2023-09-09 NOTE — Evaluation (Signed)
 Occupational Therapy Evaluation and DC Summary   Patient Details Name: Kyle Lawson MRN: [id number] DOB: [birth year] Today's Date: 09/09/2023   History of Present Illness The pt is a 68 yo male presenting for PLIF of L1-2 on 1/3 due to chronic back and R hip pain. PMH includes: arthritis, gout, HLD, HTN, thoracic aortic aneurysm, multiple back and neck surgeries (L4-5, L3-4, L2-3), and bilateral THA.   Clinical Impression   Pt admitted for above, he reports little to no back pain that only occurred with overhead reaching. Educated pt on compensatory ADL strategies to complete listed activities below. Also discussed with pt the use of a reacher to obtain objects from floor prn, he reports he may have one in the garage. Pt has no further acute skilled OT needs, Mod I for mobility and ADLs. No post acute OT needed.        If plan is discharge home, recommend the following: Assist for transportation;Assistance with cooking/housework    Functional Status Assessment  Patient has not had a recent decline in their functional status  Equipment Recommendations  None recommended by OT    Recommendations for Other Services       Precautions / Restrictions Precautions Precautions: Back Precaution Booklet Issued: Yes (comment) Precaution Comments: Recalls 3/3 precautions Required Braces or Orthoses: Spinal Brace Spinal Brace: Lumbar corset;Applied in sitting position Restrictions Weight Bearing Restrictions Per Provider Order: No      Mobility Bed Mobility Overal bed mobility: Independent             General bed mobility comments: log rolled perfectly    Transfers Overall transfer level: Independent Equipment used: None               General transfer comment: no DME      Balance Overall balance assessment: Independent                                         ADL either performed or assessed with clinical judgement   ADL Overall ADL's : Modified  independent                                       General ADL Comments: Pt already dressed on arrival, he demonstrated ability to complete LBD at mod I level. Discussed strategies for wiping, oral care at sink, and car transfers. Also discussed home modifcations to limit pain with overhead reaching     Vision         Perception         Praxis         Pertinent Vitals/Pain Pain Assessment Pain Assessment: Faces Faces Pain Scale: Hurts a little bit Pain Location: back with overhead reaching Pain Descriptors / Indicators: Sore Pain Intervention(s): Monitored during session, Repositioned     Extremity/Trunk Assessment Upper Extremity Assessment Upper Extremity Assessment: Overall WFL for tasks assessed   Lower Extremity Assessment Lower Extremity Assessment: Overall WFL for tasks assessed   Cervical / Trunk Assessment Cervical / Trunk Assessment: Back Surgery   Communication Communication Communication: No apparent difficulties Cueing Techniques: Verbal cues   Cognition Arousal: Alert Behavior During Therapy: WFL for tasks assessed/performed Overall Cognitive Status: Within Functional Limits for tasks assessed  General Comments  VSS    Exercises     Shoulder Instructions      Home Living   Living Arrangements: Spouse/significant other                           Home Equipment: Shower seat;Grab bars - toilet;Grab bars - tub/shower;Hand held shower head          Prior Functioning/Environment                          OT Problem List: Pain      OT Treatment/Interventions:      OT Goals(Current goals can be found in the care plan section) Acute Rehab OT Goals Patient Stated Goal: to go home OT Goal Formulation: All assessment and education complete, DC therapy Time For Goal Achievement: 09/23/23 Potential to Achieve Goals: Good  OT Frequency:       Co-evaluation              AM-PAC OT 6 Clicks Daily Activity     Outcome Measure Help from another person eating meals?: None Help from another person taking care of personal grooming?: None Help from another person toileting, which includes using toliet, bedpan, or urinal?: None Help from another person bathing (including washing, rinsing, drying)?: None Help from another person to put on and taking off regular upper body clothing?: None Help from another person to put on and taking off regular lower body clothing?: None 6 Click Score: 24   End of Session Equipment Utilized During Treatment: Back brace (lumbar corset prn) Nurse Communication: Mobility status  Activity Tolerance: Patient tolerated treatment well Patient left: in bed;with call bell/phone within reach  OT Visit Diagnosis: Pain Pain - Right/Left:  (back)                Time: 9162-9143 OT Time Calculation (min): 19 min Charges:  OT General Charges $OT Visit: 1 Visit OT Evaluation $OT Eval Low Complexity: 1 Low  09/09/2023  AB, OTR/L  Acute Rehabilitation Services  Office: [phone]   Curtistine JONETTA Das 09/09/2023, 9:28 AM

## 2023-10-24 NOTE — Progress Notes (Deleted)
    Aleen Sells D.[facility] [address] [place] [address] Phone: [phone]   Assessment and Plan:     There are no diagnoses linked to this encounter.  ***   Pertinent previous records reviewed include ***    Follow Up: ***     Subjective:   I, Early Ord, am serving as a Neurosurgeon for Doctor Richardean Sale  Chief Complaint: shoulder pain   HPI:   10/25/2023 Patient is a 68 year old male with shoulder pain. Patient states  Relevant Historical Information: ***  Additional pertinent review of systems negative.   Current Outpatient Medications:    acetaminophen (TYLENOL) 500 MG tablet, Take 500 mg by mouth every 6 (six) hours as needed (pain.)., Disp: , Rfl:    allopurinol (ZYLOPRIM) 300 MG tablet, TAKE 1 TABLET EVERY MORNING, Disp: 90 tablet, Rfl: 3   Cholecalciferol (VITAMIN D-3 PO), Take 2,000 Units by mouth in the morning., Disp: , Rfl:    colchicine 0.6 MG tablet, Take 1 tablet (0.6 mg total) by mouth daily. At start of flare- take 2 and then an hour later (max 3 day one) if not improved can take 1 more, then daily until gout flare resolves (Patient taking differently: Take 0.6 mg by mouth daily as needed (gout attacks).), Disp: 30 tablet, Rfl: 3   cyclobenzaprine (FLEXERIL) 10 MG tablet, Take 1 tablet (10 mg total) by mouth 3 (three) times daily as needed for muscle spasms., Disp: 30 tablet, Rfl: 0   diclofenac Sodium (PENNSAID) 2 % SOLN, Apply 1-2 Pump topically 2 (two) times daily as needed (pain.)., Disp: , Rfl:    HYDROcodone-acetaminophen (NORCO/VICODIN) 5-325 MG tablet, Take 1-2 tablets by mouth every 6 (six) hours as needed for severe pain (pain score 7-10) or moderate pain (pain score 4-6)., Disp: 30 tablet, Rfl: 0   indomethacin (INDOCIN) 50 MG capsule, Take 1 capsule (50 mg total) by mouth 3 (three) times daily with meals. (Patient taking differently: Take 50 mg by mouth 3 (three) times daily as needed (pain.).),  Disp: 90 capsule, Rfl: 1   losartan (COZAAR) 100 MG tablet, TAKE 1 TABLET BY MOUTH EVERY DAY, Disp: 90 tablet, Rfl: 3   Multiple Vitamin (MULTIVITAMIN WITH MINERALS) TABS tablet, Take 1 tablet by mouth in the morning., Disp: , Rfl:    simvastatin (ZOCOR) 40 MG tablet, TAKE 1 TABLET DAILY, Disp: 90 tablet, Rfl: 3   Objective:     There were no vitals filed for this visit.    There is no height or weight on file to calculate BMI.    Physical Exam:    ***   Electronically signed by:  Aleen Sells [facility] 7:32 AM 10/24/23

## 2023-10-25 ENCOUNTER — Ambulatory Visit: Payer: Medicare Other | Admitting: Sports Medicine

## 2023-10-26 NOTE — Progress Notes (Unsigned)
    Aleen Sells D.[facility] [address] [place] [address] Phone: [phone]   Assessment and Plan:     There are no diagnoses linked to this encounter.  ***   Pertinent previous records reviewed include ***    Follow Up: ***     Subjective:   I, Olene Godfrey, am serving as a Neurosurgeon for Doctor Richardean Sale  Chief Complaint: shoulder pain   HPI:   10/27/2023 Patient is a 68 year old male with shoulder pain. Patient states  Relevant Historical Information: ***  Additional pertinent review of systems negative.   Current Outpatient Medications:    acetaminophen (TYLENOL) 500 MG tablet, Take 500 mg by mouth every 6 (six) hours as needed (pain.)., Disp: , Rfl:    allopurinol (ZYLOPRIM) 300 MG tablet, TAKE 1 TABLET EVERY MORNING, Disp: 90 tablet, Rfl: 3   Cholecalciferol (VITAMIN D-3 PO), Take 2,000 Units by mouth in the morning., Disp: , Rfl:    colchicine 0.6 MG tablet, Take 1 tablet (0.6 mg total) by mouth daily. At start of flare- take 2 and then an hour later (max 3 day one) if not improved can take 1 more, then daily until gout flare resolves (Patient taking differently: Take 0.6 mg by mouth daily as needed (gout attacks).), Disp: 30 tablet, Rfl: 3   cyclobenzaprine (FLEXERIL) 10 MG tablet, Take 1 tablet (10 mg total) by mouth 3 (three) times daily as needed for muscle spasms., Disp: 30 tablet, Rfl: 0   diclofenac Sodium (PENNSAID) 2 % SOLN, Apply 1-2 Pump topically 2 (two) times daily as needed (pain.)., Disp: , Rfl:    HYDROcodone-acetaminophen (NORCO/VICODIN) 5-325 MG tablet, Take 1-2 tablets by mouth every 6 (six) hours as needed for severe pain (pain score 7-10) or moderate pain (pain score 4-6)., Disp: 30 tablet, Rfl: 0   indomethacin (INDOCIN) 50 MG capsule, Take 1 capsule (50 mg total) by mouth 3 (three) times daily with meals. (Patient taking differently: Take 50 mg by mouth 3 (three) times daily as needed (pain.).),  Disp: 90 capsule, Rfl: 1   losartan (COZAAR) 100 MG tablet, TAKE 1 TABLET BY MOUTH EVERY DAY, Disp: 90 tablet, Rfl: 3   Multiple Vitamin (MULTIVITAMIN WITH MINERALS) TABS tablet, Take 1 tablet by mouth in the morning., Disp: , Rfl:    simvastatin (ZOCOR) 40 MG tablet, TAKE 1 TABLET DAILY, Disp: 90 tablet, Rfl: 3   Objective:     There were no vitals filed for this visit.    There is no height or weight on file to calculate BMI.    Physical Exam:    ***   Electronically signed by:  Aleen Sells D[facility] 9:03 AM 10/26/23

## 2023-10-27 ENCOUNTER — Ambulatory Visit: Payer: Medicare Other | Admitting: Sports Medicine

## 2023-10-27 ENCOUNTER — Ambulatory Visit (INDEPENDENT_AMBULATORY_CARE_PROVIDER_SITE_OTHER): Payer: Medicare Other

## 2023-10-27 VITALS — HR 93 | Ht 69.0 in | Wt 178.0 lb

## 2023-10-27 DIAGNOSIS — M25811 Other specified joint disorders, right shoulder: Secondary | ICD-10-CM | POA: Diagnosis not present

## 2023-10-27 DIAGNOSIS — G8929 Other chronic pain: Secondary | ICD-10-CM

## 2023-10-27 DIAGNOSIS — M25511 Pain in right shoulder: Secondary | ICD-10-CM | POA: Diagnosis not present

## 2023-10-27 NOTE — Patient Instructions (Signed)
 Shoulder HEP 3-4 week follow up

## 2023-11-13 ENCOUNTER — Encounter: Payer: Self-pay | Admitting: Sports Medicine

## 2023-11-16 ENCOUNTER — Other Ambulatory Visit: Payer: Self-pay | Admitting: *Deleted

## 2023-11-16 DIAGNOSIS — I7121 Aneurysm of the ascending aorta, without rupture: Secondary | ICD-10-CM

## 2023-11-20 NOTE — Progress Notes (Unsigned)
    Kyle Lawson D.[facility] [address] [place] [address] Phone: [phone]   Assessment and Plan:     There are no diagnoses linked to this encounter.  ***   Pertinent previous records reviewed include ***    Follow Up: ***     Subjective:   I, Kyle Lawson, am serving as a Neurosurgeon for Doctor Richardean Sale   Chief Complaint: right  shoulder pain    HPI:    10/27/2023 Patient is a 68 year old male with shoulder pain. Patient states right shoulder pain. Hx of distal clavicle excision 3 years ago. Hx of rotator cuff tears. Would like a second look after 3 years. Pain has increased over the past couple of months. Pain when sleeping on that side. No new MOI. Tylenol doesn't help the shoulder much hx off a lumbar fusion 7 weeks ago. Decreased ROM in the am but once he warms up the ROM is back. No numbness or tingling.   11/21/2023 Patient states   Relevant Historical Information: Hypertension, fatty liver, gout  Additional pertinent review of systems negative.   Current Outpatient Medications:    acetaminophen (TYLENOL) 500 MG tablet, Take 500 mg by mouth every 6 (six) hours as needed (pain.)., Disp: , Rfl:    allopurinol (ZYLOPRIM) 300 MG tablet, TAKE 1 TABLET EVERY MORNING, Disp: 90 tablet, Rfl: 3   Cholecalciferol (VITAMIN D-3 PO), Take 2,000 Units by mouth in the morning., Disp: , Rfl:    colchicine 0.6 MG tablet, Take 1 tablet (0.6 mg total) by mouth daily. At start of flare- take 2 and then an hour later (max 3 day one) if not improved can take 1 more, then daily until gout flare resolves (Patient taking differently: Take 0.6 mg by mouth daily as needed (gout attacks).), Disp: 30 tablet, Rfl: 3   cyclobenzaprine (FLEXERIL) 10 MG tablet, Take 1 tablet (10 mg total) by mouth 3 (three) times daily as needed for muscle spasms., Disp: 30 tablet, Rfl: 0   diclofenac Sodium (PENNSAID) 2 % SOLN, Apply 1-2 Pump topically 2 (two) times  daily as needed (pain.)., Disp: , Rfl:    HYDROcodone-acetaminophen (NORCO/VICODIN) 5-325 MG tablet, Take 1-2 tablets by mouth every 6 (six) hours as needed for severe pain (pain score 7-10) or moderate pain (pain score 4-6)., Disp: 30 tablet, Rfl: 0   indomethacin (INDOCIN) 50 MG capsule, Take 1 capsule (50 mg total) by mouth 3 (three) times daily with meals. (Patient taking differently: Take 50 mg by mouth 3 (three) times daily as needed (pain.).), Disp: 90 capsule, Rfl: 1   losartan (COZAAR) 100 MG tablet, TAKE 1 TABLET BY MOUTH EVERY DAY, Disp: 90 tablet, Rfl: 3   Multiple Vitamin (MULTIVITAMIN WITH MINERALS) TABS tablet, Take 1 tablet by mouth in the morning., Disp: , Rfl:    simvastatin (ZOCOR) 40 MG tablet, TAKE 1 TABLET DAILY, Disp: 90 tablet, Rfl: 3   Objective:     There were no vitals filed for this visit.    There is no height or weight on file to calculate BMI.    Physical Exam:    ***   Electronically signed by:  Kyle Lawson D.[facility] 7:33 AM 11/20/23

## 2023-11-21 ENCOUNTER — Ambulatory Visit (INDEPENDENT_AMBULATORY_CARE_PROVIDER_SITE_OTHER): Payer: Medicare Other | Admitting: Sports Medicine

## 2023-11-21 VITALS — BP 130/80 | HR 96 | Ht 69.0 in | Wt 174.0 lb

## 2023-11-21 DIAGNOSIS — G8929 Other chronic pain: Secondary | ICD-10-CM

## 2023-11-21 DIAGNOSIS — M25511 Pain in right shoulder: Secondary | ICD-10-CM

## 2023-11-21 DIAGNOSIS — M25811 Other specified joint disorders, right shoulder: Secondary | ICD-10-CM | POA: Diagnosis not present

## 2023-11-24 ENCOUNTER — Ambulatory Visit: Payer: Medicare Other | Admitting: Sports Medicine

## 2023-11-27 ENCOUNTER — Other Ambulatory Visit: Payer: Self-pay | Admitting: Family Medicine

## 2023-11-30 ENCOUNTER — Other Ambulatory Visit: Payer: Self-pay | Admitting: Neurosurgery

## 2023-11-30 DIAGNOSIS — M5430 Sciatica, unspecified side: Secondary | ICD-10-CM

## 2023-12-05 ENCOUNTER — Ambulatory Visit
Admission: RE | Admit: 2023-12-05 | Discharge: 2023-12-05 | Disposition: A | Discharge status: Home / Self Care | Source: Ambulatory Visit | Attending: Neurosurgery | Admitting: Neurosurgery

## 2023-12-05 DIAGNOSIS — M5430 Sciatica, unspecified side: Secondary | ICD-10-CM

## 2023-12-20 ENCOUNTER — Ambulatory Visit
Admission: RE | Admit: 2023-12-20 | Discharge: 2023-12-20 | Disposition: A | Discharge status: Home / Self Care | Source: Ambulatory Visit | Attending: Cardiology | Admitting: Cardiology

## 2023-12-20 DIAGNOSIS — I7121 Aneurysm of the ascending aorta, without rupture: Secondary | ICD-10-CM

## 2023-12-20 MED ORDER — IOPAMIDOL (ISOVUE-370) INJECTION 76%
75.0000 mL | Freq: Once | INTRAVENOUS | Status: AC | PRN
  Administered 2023-12-20: 75 mL via INTRAVENOUS

## 2023-12-25 ENCOUNTER — Encounter: Payer: Self-pay | Admitting: *Deleted

## 2023-12-26 ENCOUNTER — Other Ambulatory Visit: Payer: Self-pay | Admitting: *Deleted

## 2023-12-26 DIAGNOSIS — I7121 Aneurysm of the ascending aorta, without rupture: Secondary | ICD-10-CM

## 2024-01-17 ENCOUNTER — Other Ambulatory Visit: Payer: Self-pay

## 2024-01-17 ENCOUNTER — Other Ambulatory Visit: Payer: Self-pay | Admitting: Family Medicine

## 2024-01-17 DIAGNOSIS — I10 Essential (primary) hypertension: Secondary | ICD-10-CM

## 2024-01-19 ENCOUNTER — Ambulatory Visit (INDEPENDENT_AMBULATORY_CARE_PROVIDER_SITE_OTHER): Payer: Self-pay | Admitting: Podiatry

## 2024-01-19 ENCOUNTER — Ambulatory Visit (INDEPENDENT_AMBULATORY_CARE_PROVIDER_SITE_OTHER)

## 2024-01-19 DIAGNOSIS — M722 Plantar fascial fibromatosis: Secondary | ICD-10-CM | POA: Diagnosis not present

## 2024-01-19 DIAGNOSIS — M7752 Other enthesopathy of left foot: Secondary | ICD-10-CM

## 2024-01-19 MED ORDER — TRIAMCINOLONE ACETONIDE 10 MG/ML IJ SUSP
10.0000 mg | Freq: Once | INTRAMUSCULAR | Status: AC
  Administered 2024-01-19: 10 mg

## 2024-01-19 MED ORDER — MELOXICAM 15 MG PO TABS: 15.0000 mg | ORAL_TABLET | Freq: Every day | ORAL | 0 refills | Status: DC

## 2024-01-19 NOTE — Patient Instructions (Signed)

## 2024-01-19 NOTE — Progress Notes (Signed)
 Chief Complaint  Patient presents with   Foot Pain    RM#3 Left heel pain for several months had back issues which has had surgery and doesn't feel it is related to his back.    HPI: 68 y.o. male presenting today with c/o pain in the bottom of the left heel.  He reports pain for several months, has been off-and-on but slowly getting worse.  Patient reports multiple back surgeries for his lumbar spine.  Did have recent surgery within the past month or 2.  He reports pain to the heel is worse after periods of rest.  The patient is active and this interferes with his desired level of activity.  Past Medical History:  Diagnosis Date   Arthritis    Gout    History of kidney stones    Hyperlipidemia    Hypertension    Thoracic aortic aneurysm (HCC) 2022   stable 4cm by CTA 12/2022   Past Surgical History:  Procedure Laterality Date   Anterior Cervical Decompression Fusion  2019   DISTAL CLAVICLE EXCISION Right 2020   Right Shoulder Distal Clavicle Excision   LAMINECTOMY  1984   L4-L5   LAMINECTOMY  1996   L3-L4   LUMBAR FUSION  01/11/2021   may 2022- L3-L5   LUMBAR FUSION  09/29/2021   L2-L3   TOTAL HIP ARTHROPLASTY Left    left hip 09/2006   TOTAL HIP ARTHROPLASTY  09/14/2011   Right. Procedure: TOTAL HIP ARTHROPLASTY;  Surgeon: Ferd Householder, MD;  Location: [facility] OR;  Service: Orthopedics;  Laterality: Right;   No Known Allergies   Physical Exam: General: The patient is alert and oriented x3 in no acute distress.  Dermatology:  No ecchymosis, erythema, or edema bilateral.  No open lesions.    Vascular: Palpable pedal pulses bilaterally. Capillary refill within normal limits.  No appreciable edema.    Neurological: Epicritic sensation is intact.  Negative Tinel's sign  Musculoskeletal Exam:  There is pain on palpation of the plantarmedial & plantarcentral aspect of left heel.  No gaps or nodules within the plantar fascia.  Positive Windlass mechanism bilateral.  Antalgic  gait noted with first few steps upon standing.  No pain on palpation of achilles tendon bilateral.  Ankle df less than 10 degrees with knee extended b/l.  Radiographic Exam: Left foot 3 views weightbearing 01/19/2024 Normal osseous mineralization. Joint spaces preserved.  No fractures noted.  Mild calcaneal spurring to the tubercle.  Assessment/Plan of Care: 1. Plantar fasciitis, left     Meds ordered this encounter  Medications   triamcinolone  acetonide (KENALOG ) 10 MG/ML injection 10 mg   meloxicam  (MOBIC ) 15 MG tablet    Sig: Take 1 tablet (15 mg total) by mouth daily.    Dispense:  30 tablet    Refill:  0   None  -Reviewed etiology of plantar fasciitis with patient.  Discussed treatment options with patient today, including cortisone injection, NSAID course of treatment, stretching exercises, physical therapy, use of night splint, rest, icing the heel, arch supports/orthotics, and supportive shoe gear.    Recommend the use of an over-the-counter night splint today.  Patient was fitted with a plantar fascia brace which was dispensed today to help alleviate strain on plantar fascia with weightbearing activity.  Discussed stretching exercises at length which were dispensed to the patient as well.  Patient is potentially interested in custom orthotics if he can get improvement.  Discussed good-quality over-the-counter inserts in the interim and continuing to use  good supportive shoe gear.  Did discuss that it is quite possible that history of back pain can be playing a role in the heel pain.  Will monitor this going forward.  Course of oral meloxicam  sent to patient's pharmacy, 15 mg daily.   Procedure: Left plantar fascia injection With the patient's verbal consent, a corticosteroid injection was administered to the left heel, consisting of a mixture of 1% lidocaine  plain, 0.5% Sensorcaine  plain, and 1 cc Kenalog -10 for a total of 2 cc administered.  A Band-aid was applied.  Patient  tolerated this well.   Return in about 2 weeks (around 02/02/2024) for Plantar Fasciitis.   Julitza Rickles L. Lunda Salines, AACFAS [facility]     [address] [place], [place] [address]                Office [phone]  Fax [phone]

## 2024-01-21 ENCOUNTER — Encounter: Payer: Self-pay | Admitting: Family Medicine

## 2024-01-22 ENCOUNTER — Other Ambulatory Visit: Payer: Self-pay

## 2024-01-22 ENCOUNTER — Encounter: Payer: Self-pay | Admitting: Podiatry

## 2024-01-22 DIAGNOSIS — I10 Essential (primary) hypertension: Secondary | ICD-10-CM

## 2024-01-22 MED ORDER — LOSARTAN POTASSIUM 100 MG PO TABS: 100.0000 mg | ORAL_TABLET | Freq: Every day | ORAL | 3 refills | Status: AC

## 2024-02-01 ENCOUNTER — Ambulatory Visit (INDEPENDENT_AMBULATORY_CARE_PROVIDER_SITE_OTHER): Admitting: Sports Medicine

## 2024-02-01 ENCOUNTER — Other Ambulatory Visit: Payer: Self-pay

## 2024-02-01 VITALS — Ht 69.0 in | Wt 173.0 lb

## 2024-02-01 DIAGNOSIS — G8929 Other chronic pain: Secondary | ICD-10-CM | POA: Diagnosis not present

## 2024-02-01 DIAGNOSIS — M25511 Pain in right shoulder: Secondary | ICD-10-CM | POA: Diagnosis not present

## 2024-02-01 DIAGNOSIS — S46211A Strain of muscle, fascia and tendon of other parts of biceps, right arm, initial encounter: Secondary | ICD-10-CM | POA: Diagnosis not present

## 2024-02-01 DIAGNOSIS — M67921 Unspecified disorder of synovium and tendon, right upper arm: Secondary | ICD-10-CM | POA: Diagnosis not present

## 2024-02-01 NOTE — Patient Instructions (Signed)
 MRI right shoulder [facility] referral  Tylenol  for day to day pain relief As needed follow up  RICES rest, ice, compression, elevation

## 2024-02-01 NOTE — Progress Notes (Signed)
 Kyle Lawson D.[facility] [address] [place] [address] Phone: [phone]   Assessment and Plan:     1. Disorder of tendon of right biceps 2. Chronic right shoulder pain 3. Biceps tendon rupture, right, initial encounter  -Chronic with exacerbation, complicated, subsequent visit - Recurrence of right shoulder pain consistent with rupture of proximal right biceps tendon with Popeye deformity.  Patient was experiencing intermittent right shoulder pain more consistent with rotator cuff pathology versus subacromial bursitis that had resolved after subacromial CSI on 10/27/2023.  Patient likely strained biceps tendon when lifting boxes over the weekend which resulted in a full tear when playing golf yesterday - Patient's pain is overall improving and would recommend continuing to ice, compress, rest shoulder to decrease pain.  Can use Tylenol  for day-to-day pain relief - Discussed that we could pursue nonsurgical or surgical options for rupture.  Patient is very active and would like to discuss surgical options with the goal of preserving as much strength as possible.  We will refer to orthopedic surgery, Dr. Hermina Loosen, and order right shoulder MRI to further assess tendon rupture  Sports Medicine: Musculoskeletal Ultrasound. Exam: Limited US  of right anterior shoulder Diagnosis: Anterior shoulder pain with Popeye deformity  US  Findings: Mid tendinous tearing of biceps tendon with portions of biceps tendon remaining within biceps groove, and mid tenderness disordered stranding consistent with tearing.  Localized hypoechoic compressible mass most consistent with hematoma   Images permanently stored.  US  Impression:  Proximal biceps tendon tear  Pertinent previous records reviewed include none  Follow Up: As needed   Subjective:   I, Kyle Lawson, am serving as a Neurosurgeon for Doctor Ulysees Gander  Chief Complaint: bicep pain   HPI:    02/01/24 Patient is a 68 year old male with bicep pain. Patient states right bicep pain he was playing golf popeye deformity. Decreased ROM due to pain.    Relevant Historical Information: Hypertension, fatty liver, gout  Additional pertinent review of systems negative.   Current Outpatient Medications:    acetaminophen  (TYLENOL ) 500 MG tablet, Take 500 mg by mouth every 6 (six) hours as needed (pain.)., Disp: , Rfl:    allopurinol  (ZYLOPRIM ) 300 MG tablet, TAKE 1 TABLET EVERY MORNING, Disp: 90 tablet, Rfl: 3   Cholecalciferol (VITAMIN D -3 PO), Take 2,000 Units by mouth in the morning., Disp: , Rfl:    colchicine  0.6 MG tablet, Take 1 tablet (0.6 mg total) by mouth daily. At start of flare- take 2 and then an hour later (max 3 day one) if not improved can take 1 more, then daily until gout flare resolves (Patient taking differently: Take 0.6 mg by mouth daily as needed (gout attacks).), Disp: 30 tablet, Rfl: 3   cyclobenzaprine  (FLEXERIL ) 10 MG tablet, Take 1 tablet (10 mg total) by mouth 3 (three) times daily as needed for muscle spasms., Disp: 30 tablet, Rfl: 0   diclofenac  Sodium (PENNSAID ) 2 % SOLN, Apply 1-2 Pump topically 2 (two) times daily as needed (pain.)., Disp: , Rfl:    HYDROcodone -acetaminophen  (NORCO/VICODIN) 5-325 MG tablet, Take 1-2 tablets by mouth every 6 (six) hours as needed for severe pain (pain score 7-10) or moderate pain (pain score 4-6)., Disp: 30 tablet, Rfl: 0   indomethacin  (INDOCIN ) 50 MG capsule, Take 1 capsule (50 mg total) by mouth 3 (three) times daily with meals. (Patient taking differently: Take 50 mg by mouth 3 (three) times daily as needed (pain.).), Disp: 90 capsule, Rfl: 1  losartan  (COZAAR ) 100 MG tablet, Take 1 tablet (100 mg total) by mouth daily., Disp: 90 tablet, Rfl: 3   meloxicam  (MOBIC ) 15 MG tablet, Take 1 tablet (15 mg total) by mouth daily., Disp: 30 tablet, Rfl: 0   Multiple Vitamin (MULTIVITAMIN WITH MINERALS) TABS tablet, Take 1 tablet by  mouth in the morning., Disp: , Rfl:    simvastatin  (ZOCOR ) 40 MG tablet, TAKE 1 TABLET DAILY, Disp: 90 tablet, Rfl: 3   Objective:     Vitals:   02/01/24 1407  Weight: 173 lb (78.5 kg)  Height: 5\' 9"  (1.753 m)      Body mass index is 25.55 kg/m.    Physical Exam:     Gen: Appears well, nad, nontoxic and pleasant Neuro:sensation intact, strength is 5/5 with df/pf/inv/ev, muscle tone wnl Skin: no suspicious lesion or defmority Psych: A&O, appropriate mood and affect  Right shoulder:  Popeye's deformity No scapular winging FF 180, abd 180, int 0, ext 90 NTTP over the [place], clavicle, ac, coracoid, biceps groove, humerus, deltoid, trapezius, cervical spine Mild discomfort with resisted shoulder and elbow flexion Negative Spurling's test bilat FROM of neck    Electronically signed by:  Marshall Skeeter D.[facility] 2:41 PM 02/01/24

## 2024-02-06 ENCOUNTER — Ambulatory Visit
Admission: RE | Admit: 2024-02-06 | Discharge: 2024-02-06 | Disposition: A | Discharge status: Home / Self Care | Source: Ambulatory Visit | Attending: Sports Medicine

## 2024-02-06 DIAGNOSIS — S46211A Strain of muscle, fascia and tendon of other parts of biceps, right arm, initial encounter: Secondary | ICD-10-CM

## 2024-02-06 DIAGNOSIS — M67921 Unspecified disorder of synovium and tendon, right upper arm: Secondary | ICD-10-CM

## 2024-02-06 DIAGNOSIS — G8929 Other chronic pain: Secondary | ICD-10-CM

## 2024-02-08 ENCOUNTER — Ambulatory Visit (HOSPITAL_BASED_OUTPATIENT_CLINIC_OR_DEPARTMENT_OTHER): Payer: Self-pay | Admitting: Orthopaedic Surgery

## 2024-02-08 ENCOUNTER — Ambulatory Visit (HOSPITAL_BASED_OUTPATIENT_CLINIC_OR_DEPARTMENT_OTHER): Admitting: Orthopaedic Surgery

## 2024-02-08 ENCOUNTER — Other Ambulatory Visit (HOSPITAL_BASED_OUTPATIENT_CLINIC_OR_DEPARTMENT_OTHER): Payer: Self-pay

## 2024-02-08 ENCOUNTER — Encounter (HOSPITAL_BASED_OUTPATIENT_CLINIC_OR_DEPARTMENT_OTHER): Payer: Self-pay

## 2024-02-08 ENCOUNTER — Encounter: Payer: Self-pay | Admitting: Family Medicine

## 2024-02-08 DIAGNOSIS — S46211A Strain of muscle, fascia and tendon of other parts of biceps, right arm, initial encounter: Secondary | ICD-10-CM | POA: Diagnosis not present

## 2024-02-08 MED ORDER — OXYCODONE HCL 5 MG PO TABS
5.0000 mg | ORAL_TABLET | ORAL | 0 refills | Status: DC | PRN
  Filled 2024-02-08: qty 10, 2d supply, fill #0

## 2024-02-08 MED ORDER — IBUPROFEN 800 MG PO TABS
800.0000 mg | ORAL_TABLET | Freq: Three times a day (TID) | ORAL | 0 refills | Status: AC
  Filled 2024-02-08: qty 30, 10d supply, fill #0

## 2024-02-08 MED ORDER — ACETAMINOPHEN 500 MG PO TABS
500.0000 mg | ORAL_TABLET | Freq: Three times a day (TID) | ORAL | 0 refills | Status: AC
  Filled 2024-02-08: qty 30, 10d supply, fill #0

## 2024-02-08 MED ORDER — ASPIRIN 325 MG PO TBEC
325.0000 mg | DELAYED_RELEASE_TABLET | Freq: Every day | ORAL | 0 refills | Status: DC
  Filled 2024-02-08: qty 14, 14d supply, fill #0

## 2024-02-08 NOTE — H&P (View-Only) (Signed)
 Chief Complaint: Right shoulder pain     History of Present Illness:    Kyle Lawson is a 68 y.o. 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  This is his dominant arm.  He does enjoy golfing.  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  He is here today as a referral from Dr. Cleora Daft for further discussion of the right shoulder.  He does have a history of a subacromial decompression with distal clavicle resection with Dr. Deeann Fare many years prior.  He was doing well with this with some occasional shoulder pain until this more traumatic injury 2 weeks prior    PMH/PSH/Family History/Social History/Meds/Allergies:    Past Medical History:  Diagnosis Date   Arthritis    Gout    History of kidney stones    Hyperlipidemia    Hypertension    Thoracic aortic aneurysm (HCC) 2022   stable 4cm by CTA 12/2022   Past Surgical History:  Procedure Laterality Date   Anterior Cervical Decompression Fusion  2019   DISTAL CLAVICLE EXCISION Right 2020   Right Shoulder Distal Clavicle Excision   LAMINECTOMY  1984   L4-L5   LAMINECTOMY  1996   L3-L4   LUMBAR FUSION  01/11/2021   may 2022- L3-L5   LUMBAR FUSION  09/29/2021   L2-L3   TOTAL HIP ARTHROPLASTY Left    left hip 09/2006   TOTAL HIP ARTHROPLASTY  09/14/2011   Right. Procedure: TOTAL HIP ARTHROPLASTY;  Surgeon: Ferd Householder, MD;  Location: [facility] OR;  Service: Orthopedics;  Laterality: Right;   Social History   Socioeconomic History   Marital status: Married    Spouse name: Not on file   Number of children: 2   Years of education: Not on file   Highest education level: Not on file  Occupational History   Not on file  Tobacco Use   Smoking status: Never   Smokeless tobacco: Never  Vaping Use   Vaping status: Never Used  Substance and Sexual Activity    Alcohol use: Not Currently    Alcohol/week: 4.0 - 5.0 standard drinks of alcohol    Types: 4 - 5 Cans of beer per week   Drug use: Yes    Types: Marijuana    Comment: for pain- very occasionally   Sexual activity: Yes    Partners: Female  Other Topics Concern   Not on file  Social History Narrative   Married 28 years in 2023. Daughter 25, son 90 in 2023.    From [place]       Retired from [employer]- enjoying retirement      Hobbies: traveling, hiking, walks daily, live music   Social Drivers of Health   Financial Resource Strain: Low Risk  (07/26/2023)   Overall Financial Resource Strain (CARDIA)    Difficulty of Paying Living Expenses: Not hard at all  Food Insecurity: No Food Insecurity (07/26/2023)   Hunger Vital Sign    Worried About Running Out of Food in the Last Year: Never true    Ran Out of Food in the Last Year: Never true  Transportation Needs: No Transportation Needs (07/26/2023)   PRAPARE - Administrator, Civil Service (Medical): No    Lack of Transportation (Non-Medical): No  Physical Activity: Sufficiently Active (07/26/2023)  Exercise Vital Sign    Days of Exercise per Week: 5 days    Minutes of Exercise per Session: 90 min  Stress: No Stress Concern Present (07/26/2023)   [employer] of Occupational Health - Occupational Stress Questionnaire    Feeling of Stress : Not at all  Social Connections: Moderately Isolated (07/26/2023)   Social Connection and Isolation Panel [NHANES]    Frequency of Communication with Friends and Family: More than three times a week    Frequency of Social Gatherings with Friends and Family: More than three times a week    Attends Religious Services: Never    Database administrator or Organizations: No    Attends Engineer, structural: Never    Marital Status: Married   Family History  Problem Relation Age of Onset   Diabetes Mother        passed at 8   Skin cancer Father    Gout Father     Hypertension Father        passed at 62   Hyperlipidemia Father    CAD Brother        CABG 74   Healthy Brother    Healthy Brother    Alcoholism Brother    Depression Brother        suicide age 66   Healthy Maternal Grandmother        lived to 8   Brain cancer Maternal Grandfather        age 36- was an MD   CAD Paternal Grandmother        died in 52s   CAD Paternal Grandfather        died in 61s   Obesity Sister    Healthy Sister    No Known Allergies Current Outpatient Medications  Medication Sig Dispense Refill   acetaminophen  (TYLENOL ) 500 MG tablet Take 1 tablet (500 mg total) by mouth every 8 (eight) hours for 10 days. 30 tablet 0   aspirin EC 325 MG tablet Take 1 tablet (325 mg total) by mouth daily. 14 tablet 0   ibuprofen (ADVIL) 800 MG tablet Take 1 tablet (800 mg total) by mouth every 8 (eight) hours for 10 days. Please take with food, please alternate with acetaminophen  30 tablet 0   oxyCODONE  (ROXICODONE ) 5 MG immediate release tablet Take 1 tablet (5 mg total) by mouth every 4 (four) hours as needed for severe pain (pain score 7-10) or breakthrough pain. 10 tablet 0   acetaminophen  (TYLENOL ) 500 MG tablet Take 500 mg by mouth every 6 (six) hours as needed (pain.).     allopurinol  (ZYLOPRIM ) 300 MG tablet TAKE 1 TABLET EVERY MORNING 90 tablet 3   Cholecalciferol (VITAMIN D -3 PO) Take 2,000 Units by mouth in the morning.     colchicine  0.6 MG tablet Take 1 tablet (0.6 mg total) by mouth daily. At start of flare- take 2 and then an hour later (max 3 day one) if not improved can take 1 more, then daily until gout flare resolves (Patient taking differently: Take 0.6 mg by mouth daily as needed (gout attacks).) 30 tablet 3   cyclobenzaprine  (FLEXERIL ) 10 MG tablet Take 1 tablet (10 mg total) by mouth 3 (three) times daily as needed for muscle spasms. 30 tablet 0   diclofenac  Sodium (PENNSAID ) 2 % SOLN Apply 1-2 Pump topically 2 (two) times daily as needed (pain.).      HYDROcodone -acetaminophen  (NORCO/VICODIN) 5-325 MG tablet Take 1-2 tablets by mouth every 6 (six)  hours as needed for severe pain (pain score 7-10) or moderate pain (pain score 4-6). 30 tablet 0   indomethacin  (INDOCIN ) 50 MG capsule Take 1 capsule (50 mg total) by mouth 3 (three) times daily with meals. (Patient taking differently: Take 50 mg by mouth 3 (three) times daily as needed (pain.).) 90 capsule 1   losartan  (COZAAR ) 100 MG tablet Take 1 tablet (100 mg total) by mouth daily. 90 tablet 3   meloxicam  (MOBIC ) 15 MG tablet Take 1 tablet (15 mg total) by mouth daily. 30 tablet 0   Multiple Vitamin (MULTIVITAMIN WITH MINERALS) TABS tablet Take 1 tablet by mouth in the morning.     simvastatin  (ZOCOR ) 40 MG tablet TAKE 1 TABLET DAILY 90 tablet 3   No current facility-administered medications for this visit.   No results found.  Review of Systems:   A ROS was performed including pertinent positives and negatives as documented in the HPI.  Physical Exam :   Constitutional: NAD and appears stated age Neurological: Alert and oriented Psych: Appropriate affect and cooperative There were no vitals taken for this visit.   Comprehensive Musculoskeletal Exam:    Right arm with positive Popeye sign.  There is tenderness about the anterior and lateral deltoid.  There is pain with overhead range of motion.  Overhead flexion is 270 degrees equal bilaterally external rotation of the side is 50 degrees bilaterally internal rotation is to L1 bilaterally.  Positive Neer impingement sign   Imaging:   Xray (3 views right shoulder): Normal  MRI (right shoulder): There is evidence of subacromial bursal sided tearing of the rotator cuff with a complete rupture of the long head of the biceps tendon   I personally reviewed and interpreted the radiographs.   Assessment and Plan:   68 y.o. male with evidence of ongoing partial-thickness subacromial tearing of the rotator cuff and now a full-thickness  long head of the biceps tendon tear.  He has trialed strengthening aggressively with physical therapy in the past but now has had a traumatic injury.  Given this I did discuss treatment options.  I did discuss that he would be a candidate for a subpectoral biceps tenodesis.  I would advise at that time given the fact that he does have evidence of subacromial tearing that I would recommend a collagen patch augmentation of the right shoulder to allow for increased longevity of the right shoulder as well as lower the likelihood of a long-term chronic rupture.  At this time he is extensively trialed physical therapy as well as an injection in the past without relief.  Given this he is elected to proceed with surgery  -Plan for right shoulder arthroscopy with subpectoral biceps tenodesis and collagen patch augmentation    After a lengthy discussion of treatment options, including risks, benefits, alternatives, complications of surgical and nonsurgical conservative options, the patient elected surgical repair.   The patient  is aware of the material risks  and complications including, but not limited to injury to adjacent structures, neurovascular injury, infection, numbness, bleeding, implant failure, thermal burns, stiffness, persistent pain, failure to heal, disease transmission from allograft, need for further surgery, dislocation, anesthetic risks, blood clots, risks of death,and others. The probabilities of surgical success and failure discussed with patient given their particular co-morbidities.The time and nature of expected rehabilitation and recovery was discussed.The patient's questions were all answered preoperatively.  No barriers to understanding were noted. I explained the natural history of the disease process and Rx rationale.  I explained to the patient what I considered to be reasonable expectations given their personal situation.  The final treatment plan was arrived at through a shared  patient decision making process model.   I personally saw and evaluated the patient, and participated in the management and treatment plan.  Wilhelmenia Harada, MD Attending Physician, Orthopedic Surgery  This document was dictated using Dragon voice recognition software. A reasonable attempt at proof reading has been made to minimize errors.

## 2024-02-08 NOTE — Progress Notes (Signed)
 Chief Complaint: Right shoulder pain     History of Present Illness:    Kyle Lawson is a 68 y.o. 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  This is his dominant arm.  He does enjoy golfing.  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  He is here today as a referral from Dr. Cleora Daft for further discussion of the right shoulder.  He does have a history of a subacromial decompression with distal clavicle resection with Dr. Deeann Fare many years prior.  He was doing well with this with some occasional shoulder pain until this more traumatic injury 2 weeks prior    PMH/PSH/Family History/Social History/Meds/Allergies:    Past Medical History:  Diagnosis Date   Arthritis    Gout    History of kidney stones    Hyperlipidemia    Hypertension    Thoracic aortic aneurysm (HCC) 2022   stable 4cm by CTA 12/2022   Past Surgical History:  Procedure Laterality Date   Anterior Cervical Decompression Fusion  2019   DISTAL CLAVICLE EXCISION Right 2020   Right Shoulder Distal Clavicle Excision   LAMINECTOMY  1984   L4-L5   LAMINECTOMY  1996   L3-L4   LUMBAR FUSION  01/11/2021   may 2022- L3-L5   LUMBAR FUSION  09/29/2021   L2-L3   TOTAL HIP ARTHROPLASTY Left    left hip 09/2006   TOTAL HIP ARTHROPLASTY  09/14/2011   Right. Procedure: TOTAL HIP ARTHROPLASTY;  Surgeon: Ferd Householder, MD;  Location: [facility] OR;  Service: Orthopedics;  Laterality: Right;   Social History   Socioeconomic History   Marital status: Married    Spouse name: Not on file   Number of children: 2   Years of education: Not on file   Highest education level: Not on file  Occupational History   Not on file  Tobacco Use   Smoking status: Never   Smokeless tobacco: Never  Vaping Use   Vaping status: Never Used  Substance and Sexual Activity    Alcohol use: Not Currently    Alcohol/week: 4.0 - 5.0 standard drinks of alcohol    Types: 4 - 5 Cans of beer per week   Drug use: Yes    Types: Marijuana    Comment: for pain- very occasionally   Sexual activity: Yes    Partners: Female  Other Topics Concern   Not on file  Social History Narrative   Married 28 years in 2023. Daughter 25, son 90 in 2023.    From [place]       Retired from [employer]- enjoying retirement      Hobbies: traveling, hiking, walks daily, live music   Social Drivers of Health   Financial Resource Strain: Low Risk  (07/26/2023)   Overall Financial Resource Strain (CARDIA)    Difficulty of Paying Living Expenses: Not hard at all  Food Insecurity: No Food Insecurity (07/26/2023)   Hunger Vital Sign    Worried About Running Out of Food in the Last Year: Never true    Ran Out of Food in the Last Year: Never true  Transportation Needs: No Transportation Needs (07/26/2023)   PRAPARE - Administrator, Civil Service (Medical): No    Lack of Transportation (Non-Medical): No  Physical Activity: Sufficiently Active (07/26/2023)  Exercise Vital Sign    Days of Exercise per Week: 5 days    Minutes of Exercise per Session: 90 min  Stress: No Stress Concern Present (07/26/2023)   [employer] of Occupational Health - Occupational Stress Questionnaire    Feeling of Stress : Not at all  Social Connections: Moderately Isolated (07/26/2023)   Social Connection and Isolation Panel [NHANES]    Frequency of Communication with Friends and Family: More than three times a week    Frequency of Social Gatherings with Friends and Family: More than three times a week    Attends Religious Services: Never    Database administrator or Organizations: No    Attends Engineer, structural: Never    Marital Status: Married   Family History  Problem Relation Age of Onset   Diabetes Mother        passed at 8   Skin cancer Father    Gout Father     Hypertension Father        passed at 62   Hyperlipidemia Father    CAD Brother        CABG 74   Healthy Brother    Healthy Brother    Alcoholism Brother    Depression Brother        suicide age 66   Healthy Maternal Grandmother        lived to 8   Brain cancer Maternal Grandfather        age 36- was an MD   CAD Paternal Grandmother        died in 52s   CAD Paternal Grandfather        died in 61s   Obesity Sister    Healthy Sister    No Known Allergies Current Outpatient Medications  Medication Sig Dispense Refill   acetaminophen  (TYLENOL ) 500 MG tablet Take 1 tablet (500 mg total) by mouth every 8 (eight) hours for 10 days. 30 tablet 0   aspirin EC 325 MG tablet Take 1 tablet (325 mg total) by mouth daily. 14 tablet 0   ibuprofen (ADVIL) 800 MG tablet Take 1 tablet (800 mg total) by mouth every 8 (eight) hours for 10 days. Please take with food, please alternate with acetaminophen  30 tablet 0   oxyCODONE  (ROXICODONE ) 5 MG immediate release tablet Take 1 tablet (5 mg total) by mouth every 4 (four) hours as needed for severe pain (pain score 7-10) or breakthrough pain. 10 tablet 0   acetaminophen  (TYLENOL ) 500 MG tablet Take 500 mg by mouth every 6 (six) hours as needed (pain.).     allopurinol  (ZYLOPRIM ) 300 MG tablet TAKE 1 TABLET EVERY MORNING 90 tablet 3   Cholecalciferol (VITAMIN D -3 PO) Take 2,000 Units by mouth in the morning.     colchicine  0.6 MG tablet Take 1 tablet (0.6 mg total) by mouth daily. At start of flare- take 2 and then an hour later (max 3 day one) if not improved can take 1 more, then daily until gout flare resolves (Patient taking differently: Take 0.6 mg by mouth daily as needed (gout attacks).) 30 tablet 3   cyclobenzaprine  (FLEXERIL ) 10 MG tablet Take 1 tablet (10 mg total) by mouth 3 (three) times daily as needed for muscle spasms. 30 tablet 0   diclofenac  Sodium (PENNSAID ) 2 % SOLN Apply 1-2 Pump topically 2 (two) times daily as needed (pain.).      HYDROcodone -acetaminophen  (NORCO/VICODIN) 5-325 MG tablet Take 1-2 tablets by mouth every 6 (six)  hours as needed for severe pain (pain score 7-10) or moderate pain (pain score 4-6). 30 tablet 0   indomethacin  (INDOCIN ) 50 MG capsule Take 1 capsule (50 mg total) by mouth 3 (three) times daily with meals. (Patient taking differently: Take 50 mg by mouth 3 (three) times daily as needed (pain.).) 90 capsule 1   losartan  (COZAAR ) 100 MG tablet Take 1 tablet (100 mg total) by mouth daily. 90 tablet 3   meloxicam  (MOBIC ) 15 MG tablet Take 1 tablet (15 mg total) by mouth daily. 30 tablet 0   Multiple Vitamin (MULTIVITAMIN WITH MINERALS) TABS tablet Take 1 tablet by mouth in the morning.     simvastatin  (ZOCOR ) 40 MG tablet TAKE 1 TABLET DAILY 90 tablet 3   No current facility-administered medications for this visit.   No results found.  Review of Systems:   A ROS was performed including pertinent positives and negatives as documented in the HPI.  Physical Exam :   Constitutional: NAD and appears stated age Neurological: Alert and oriented Psych: Appropriate affect and cooperative There were no vitals taken for this visit.   Comprehensive Musculoskeletal Exam:    Right arm with positive Popeye sign.  There is tenderness about the anterior and lateral deltoid.  There is pain with overhead range of motion.  Overhead flexion is 270 degrees equal bilaterally external rotation of the side is 50 degrees bilaterally internal rotation is to L1 bilaterally.  Positive Neer impingement sign   Imaging:   Xray (3 views right shoulder): Normal  MRI (right shoulder): There is evidence of subacromial bursal sided tearing of the rotator cuff with a complete rupture of the long head of the biceps tendon   I personally reviewed and interpreted the radiographs.   Assessment and Plan:   68 y.o. male with evidence of ongoing partial-thickness subacromial tearing of the rotator cuff and now a full-thickness  long head of the biceps tendon tear.  He has trialed strengthening aggressively with physical therapy in the past but now has had a traumatic injury.  Given this I did discuss treatment options.  I did discuss that he would be a candidate for a subpectoral biceps tenodesis.  I would advise at that time given the fact that he does have evidence of subacromial tearing that I would recommend a collagen patch augmentation of the right shoulder to allow for increased longevity of the right shoulder as well as lower the likelihood of a long-term chronic rupture.  At this time he is extensively trialed physical therapy as well as an injection in the past without relief.  Given this he is elected to proceed with surgery  -Plan for right shoulder arthroscopy with subpectoral biceps tenodesis and collagen patch augmentation    After a lengthy discussion of treatment options, including risks, benefits, alternatives, complications of surgical and nonsurgical conservative options, the patient elected surgical repair.   The patient  is aware of the material risks  and complications including, but not limited to injury to adjacent structures, neurovascular injury, infection, numbness, bleeding, implant failure, thermal burns, stiffness, persistent pain, failure to heal, disease transmission from allograft, need for further surgery, dislocation, anesthetic risks, blood clots, risks of death,and others. The probabilities of surgical success and failure discussed with patient given their particular co-morbidities.The time and nature of expected rehabilitation and recovery was discussed.The patient's questions were all answered preoperatively.  No barriers to understanding were noted. I explained the natural history of the disease process and Rx rationale.  I explained to the patient what I considered to be reasonable expectations given their personal situation.  The final treatment plan was arrived at through a shared  patient decision making process model.   I personally saw and evaluated the patient, and participated in the management and treatment plan.  Wilhelmenia Harada, MD Attending Physician, Orthopedic Surgery  This document was dictated using Dragon voice recognition software. A reasonable attempt at proof reading has been made to minimize errors.

## 2024-02-12 ENCOUNTER — Other Ambulatory Visit: Payer: Self-pay

## 2024-02-13 ENCOUNTER — Ambulatory Visit: Payer: Self-pay | Admitting: Sports Medicine

## 2024-02-14 ENCOUNTER — Encounter (HOSPITAL_BASED_OUTPATIENT_CLINIC_OR_DEPARTMENT_OTHER): Payer: Self-pay | Admitting: Orthopaedic Surgery

## 2024-02-14 ENCOUNTER — Other Ambulatory Visit: Payer: Self-pay

## 2024-02-16 ENCOUNTER — Ambulatory Visit: Admitting: Podiatry

## 2024-02-19 ENCOUNTER — Encounter (HOSPITAL_BASED_OUTPATIENT_CLINIC_OR_DEPARTMENT_OTHER): Payer: Self-pay | Admitting: Orthopaedic Surgery

## 2024-02-19 ENCOUNTER — Encounter (HOSPITAL_BASED_OUTPATIENT_CLINIC_OR_DEPARTMENT_OTHER)
Admission: RE | Disposition: A | Discharge status: Home / Self Care | Payer: Self-pay | Source: Home / Self Care | Attending: Orthopaedic Surgery

## 2024-02-19 ENCOUNTER — Other Ambulatory Visit: Payer: Self-pay

## 2024-02-19 ENCOUNTER — Ambulatory Visit (HOSPITAL_BASED_OUTPATIENT_CLINIC_OR_DEPARTMENT_OTHER): Admitting: Orthopaedic Surgery

## 2024-02-19 ENCOUNTER — Ambulatory Visit (HOSPITAL_BASED_OUTPATIENT_CLINIC_OR_DEPARTMENT_OTHER): Admitting: Anesthesiology

## 2024-02-19 ENCOUNTER — Ambulatory Visit (HOSPITAL_BASED_OUTPATIENT_CLINIC_OR_DEPARTMENT_OTHER)
Admission: RE | Admit: 2024-02-19 | Discharge: 2024-02-19 | Disposition: A | Discharge status: Home / Self Care | Attending: Orthopaedic Surgery | Admitting: Orthopaedic Surgery

## 2024-02-19 DIAGNOSIS — S46011A Strain of muscle(s) and tendon(s) of the rotator cuff of right shoulder, initial encounter: Secondary | ICD-10-CM | POA: Diagnosis not present

## 2024-02-19 DIAGNOSIS — M1990 Unspecified osteoarthritis, unspecified site: Secondary | ICD-10-CM | POA: Diagnosis not present

## 2024-02-19 DIAGNOSIS — Z791 Long term (current) use of non-steroidal anti-inflammatories (NSAID): Secondary | ICD-10-CM | POA: Diagnosis not present

## 2024-02-19 DIAGNOSIS — M7541 Impingement syndrome of right shoulder: Secondary | ICD-10-CM | POA: Insufficient documentation

## 2024-02-19 DIAGNOSIS — M75111 Incomplete rotator cuff tear or rupture of right shoulder, not specified as traumatic: Secondary | ICD-10-CM

## 2024-02-19 DIAGNOSIS — Z01818 Encounter for other preprocedural examination: Secondary | ICD-10-CM

## 2024-02-19 DIAGNOSIS — I10 Essential (primary) hypertension: Secondary | ICD-10-CM | POA: Diagnosis not present

## 2024-02-19 DIAGNOSIS — Z7982 Long term (current) use of aspirin: Secondary | ICD-10-CM | POA: Insufficient documentation

## 2024-02-19 DIAGNOSIS — Y9353 Activity, golf: Secondary | ICD-10-CM | POA: Diagnosis not present

## 2024-02-19 DIAGNOSIS — S46211D Strain of muscle, fascia and tendon of other parts of biceps, right arm, subsequent encounter: Secondary | ICD-10-CM

## 2024-02-19 DIAGNOSIS — S46211A Strain of muscle, fascia and tendon of other parts of biceps, right arm, initial encounter: Secondary | ICD-10-CM | POA: Diagnosis not present

## 2024-02-19 DIAGNOSIS — M7521 Bicipital tendinitis, right shoulder: Secondary | ICD-10-CM | POA: Diagnosis not present

## 2024-02-19 DIAGNOSIS — I7121 Aneurysm of the ascending aorta, without rupture: Secondary | ICD-10-CM | POA: Diagnosis not present

## 2024-02-19 DIAGNOSIS — Z79899 Other long term (current) drug therapy: Secondary | ICD-10-CM | POA: Diagnosis not present

## 2024-02-19 HISTORY — PX: SHOULDER ARTHROSCOPY WITH ROTATOR CUFF REPAIR: SHX5685

## 2024-02-19 HISTORY — PX: BICEPT TENODESIS: SHX5116

## 2024-02-19 SURGERY — ARTHROSCOPY, SHOULDER, WITH ROTATOR CUFF REPAIR
Anesthesia: General | Site: Shoulder | Laterality: Right

## 2024-02-19 MED ORDER — ROPIVACAINE HCL 5 MG/ML IJ SOLN: INTRAMUSCULAR | Status: DC | PRN

## 2024-02-19 MED ORDER — MIDAZOLAM HCL 2 MG/2ML IJ SOLN
INTRAMUSCULAR | Status: AC
  Filled 2024-02-19: qty 2

## 2024-02-19 MED ORDER — MIDAZOLAM HCL 2 MG/2ML IJ SOLN
2.0000 mg | Freq: Once | INTRAMUSCULAR | Status: AC
  Administered 2024-02-19: 2 mg via INTRAVENOUS

## 2024-02-19 MED ORDER — ONDANSETRON HCL 4 MG/2ML IJ SOLN: 4.0000 mg | Freq: Once | INTRAMUSCULAR | Status: DC | PRN

## 2024-02-19 MED ORDER — TRANEXAMIC ACID-NACL 1000-0.7 MG/100ML-% IV SOLN: 1000.0000 mg | INTRAVENOUS | Status: AC

## 2024-02-19 MED ORDER — PROPOFOL 10 MG/ML IV BOLUS: INTRAVENOUS | Status: DC | PRN

## 2024-02-19 MED ORDER — DEXAMETHASONE SODIUM PHOSPHATE 10 MG/ML IJ SOLN
INTRAMUSCULAR | Status: AC
Start: 2024-02-19 — End: 2024-02-19
  Filled 2024-02-19: qty 2

## 2024-02-19 MED ORDER — ROCURONIUM BROMIDE 100 MG/10ML IV SOLN: INTRAVENOUS | Status: DC | PRN

## 2024-02-19 MED ORDER — ACETAMINOPHEN 500 MG PO TABS: 1000.0000 mg | ORAL_TABLET | Freq: Once | ORAL | Status: AC

## 2024-02-19 MED ORDER — KETOROLAC TROMETHAMINE 30 MG/ML IJ SOLN
15.0000 mg | Freq: Once | INTRAMUSCULAR | Status: AC
  Administered 2024-02-19: 15 mg via INTRAVENOUS

## 2024-02-19 MED ORDER — DEXAMETHASONE SODIUM PHOSPHATE 4 MG/ML IJ SOLN: INTRAMUSCULAR | Status: DC | PRN

## 2024-02-19 MED ORDER — ONDANSETRON HCL 4 MG/2ML IJ SOLN
INTRAMUSCULAR | Status: DC | PRN
Start: 2024-02-19 — End: 2024-02-19

## 2024-02-19 MED ORDER — KETOROLAC TROMETHAMINE 15 MG/ML IJ SOLN: 15.0000 mg | Freq: Once | INTRAMUSCULAR | Status: DC

## 2024-02-19 MED ORDER — CEFAZOLIN SODIUM-DEXTROSE 2-4 GM/100ML-% IV SOLN: 2.0000 g | INTRAVENOUS | Status: AC

## 2024-02-19 MED ORDER — CEFAZOLIN SODIUM-DEXTROSE 2-4 GM/100ML-% IV SOLN
INTRAVENOUS | Status: AC
  Filled 2024-02-19: qty 100

## 2024-02-19 MED ORDER — ONDANSETRON HCL 4 MG/2ML IJ SOLN
INTRAMUSCULAR | Status: AC
  Filled 2024-02-19: qty 4

## 2024-02-19 MED ORDER — FENTANYL CITRATE (PF) 100 MCG/2ML IJ SOLN
INTRAMUSCULAR | Status: AC
  Filled 2024-02-19: qty 2

## 2024-02-19 MED ORDER — FENTANYL CITRATE (PF) 100 MCG/2ML IJ SOLN
50.0000 ug | Freq: Once | INTRAMUSCULAR | Status: AC
  Administered 2024-02-19: 50 ug via INTRAVENOUS

## 2024-02-19 MED ORDER — LIDOCAINE HCL (CARDIAC) PF 100 MG/5ML IV SOSY: PREFILLED_SYRINGE | INTRAVENOUS | Status: DC | PRN

## 2024-02-19 MED ORDER — BUPIVACAINE LIPOSOME 1.3 % IJ SUSP: INTRAMUSCULAR | Status: DC | PRN

## 2024-02-19 MED ORDER — LACTATED RINGERS IV SOLN: INTRAVENOUS | Status: DC

## 2024-02-19 MED ORDER — OXYCODONE HCL 5 MG/5ML PO SOLN: 5.0000 mg | Freq: Once | ORAL | Status: DC | PRN

## 2024-02-19 MED ORDER — GABAPENTIN 300 MG PO CAPS
ORAL_CAPSULE | ORAL | Status: AC
  Filled 2024-02-19: qty 1

## 2024-02-19 MED ORDER — FENTANYL CITRATE (PF) 100 MCG/2ML IJ SOLN: INTRAMUSCULAR | Status: DC | PRN

## 2024-02-19 MED ORDER — ACETAMINOPHEN 500 MG PO TABS
ORAL_TABLET | ORAL | Status: AC
  Filled 2024-02-19: qty 2

## 2024-02-19 MED ORDER — SUGAMMADEX SODIUM 200 MG/2ML IV SOLN: INTRAVENOUS | Status: DC | PRN

## 2024-02-19 MED ORDER — ACETAMINOPHEN 500 MG PO TABS
1000.0000 mg | ORAL_TABLET | Freq: Once | ORAL | Status: AC
  Administered 2024-02-19: 1000 mg via ORAL

## 2024-02-19 MED ORDER — PHENYLEPHRINE 80 MCG/ML (10ML) SYRINGE FOR IV PUSH (FOR BLOOD PRESSURE SUPPORT)
PREFILLED_SYRINGE | INTRAVENOUS | Status: AC
Start: 2024-02-19 — End: 2024-02-19
  Filled 2024-02-19: qty 20

## 2024-02-19 MED ORDER — EPHEDRINE 5 MG/ML INJ
INTRAVENOUS | Status: AC
  Filled 2024-02-19: qty 10

## 2024-02-19 MED ORDER — TRANEXAMIC ACID-NACL 1000-0.7 MG/100ML-% IV SOLN
INTRAVENOUS | Status: AC
  Filled 2024-02-19: qty 100

## 2024-02-19 MED ORDER — HYDROMORPHONE HCL 1 MG/ML IJ SOLN: 0.2500 mg | INTRAMUSCULAR | Status: DC | PRN

## 2024-02-19 MED ORDER — AMISULPRIDE (ANTIEMETIC) 5 MG/2ML IV SOLN: 10.0000 mg | Freq: Once | INTRAVENOUS | Status: DC | PRN

## 2024-02-19 MED ORDER — KETOROLAC TROMETHAMINE 30 MG/ML IJ SOLN
INTRAMUSCULAR | Status: AC
  Filled 2024-02-19: qty 1

## 2024-02-19 MED ORDER — GABAPENTIN 300 MG PO CAPS
300.0000 mg | ORAL_CAPSULE | Freq: Once | ORAL | Status: AC
  Administered 2024-02-19: 300 mg via ORAL

## 2024-02-19 MED ORDER — SODIUM CHLORIDE 0.9 % IR SOLN
Status: DC | PRN
  Administered 2024-02-19: 15000 mL

## 2024-02-19 MED ORDER — PHENYLEPHRINE HCL (PRESSORS) 10 MG/ML IV SOLN: INTRAVENOUS | Status: DC | PRN

## 2024-02-19 MED ORDER — ALBUTEROL SULFATE HFA 108 (90 BASE) MCG/ACT IN AERS
INHALATION_SPRAY | RESPIRATORY_TRACT | Status: AC
Start: 2024-02-19 — End: 2024-02-19
  Filled 2024-02-19: qty 6.7

## 2024-02-19 MED ORDER — OXYCODONE HCL 5 MG PO TABS: 5.0000 mg | ORAL_TABLET | Freq: Once | ORAL | Status: DC | PRN

## 2024-02-19 SURGICAL SUPPLY — 56 items
ANCHOR DUAL INSERTER (Anchor) IMPLANT
ANCHOR QUATTRO LINK KNTLS 2.9 (Anchor) IMPLANT
BLADE EXCALIBUR 4.0X13 (MISCELLANEOUS) ×2 IMPLANT
BURR OVAL 8 FLU 4.0X13 (MISCELLANEOUS) ×2 IMPLANT
CANN ARTH HYDRODAM HIP 8.5X75 (CANNULA) IMPLANT
CANNULA 5.75X71 LONG (CANNULA) IMPLANT
CANNULA PASSPORT 5 (CANNULA) IMPLANT
CANNULA PASSPORT BUTTON 10-40 (CANNULA) IMPLANT
CANNULA TWIST IN 8.25X7CM (CANNULA) IMPLANT
CHLORAPREP W/TINT 26 (MISCELLANEOUS) ×4 IMPLANT
COOLER ICEMAN CLASSIC (MISCELLANEOUS) ×2 IMPLANT
DRAPE IMP U-DRAPE 54X76 (DRAPES) ×2 IMPLANT
DRAPE INCISE IOBAN 66X45 STRL (DRAPES) ×2 IMPLANT
DRAPE SHOULDER BEACH CHAIR (DRAPES) ×2 IMPLANT
DRAPE U-SHAPE 47X51 STRL (DRAPES) ×4 IMPLANT
DW OUTFLOW CASSETTE/TUBE SET (MISCELLANEOUS) ×2 IMPLANT
GAUZE PAD ABD 8X10 STRL (GAUZE/BANDAGES/DRESSINGS) ×2 IMPLANT
GAUZE SPONGE 4X4 12PLY STRL (GAUZE/BANDAGES/DRESSINGS) ×2 IMPLANT
GAUZE XEROFORM 1X8 LF (GAUZE/BANDAGES/DRESSINGS) ×2 IMPLANT
GLOVE BIO SURGEON STRL SZ 6 (GLOVE) ×2 IMPLANT
GLOVE BIO SURGEON STRL SZ7.5 (GLOVE) ×2 IMPLANT
GLOVE BIOGEL PI IND STRL 6.5 (GLOVE) ×2 IMPLANT
GLOVE BIOGEL PI IND STRL 7.0 (GLOVE) IMPLANT
GLOVE BIOGEL PI IND STRL 7.5 (GLOVE) IMPLANT
GLOVE BIOGEL PI IND STRL 8 (GLOVE) ×2 IMPLANT
GOWN STRL REUS W/ TWL LRG LVL3 (GOWN DISPOSABLE) ×4 IMPLANT
GOWN STRL REUS W/ TWL XL LVL3 (GOWN DISPOSABLE) ×2 IMPLANT
IMPL TAPESTRY RC 26X26 LAT (Orthopedic Implant) IMPLANT
KIT STABILIZATION SHOULDER (MISCELLANEOUS) ×2 IMPLANT
KIT STR SPEAR 1.8 FBRTK DISP (KITS) IMPLANT
LASSO 90 CVE QUICKPAS (DISPOSABLE) IMPLANT
LASSO CRESCENT QUICKPASS (SUTURE) IMPLANT
MANIFOLD NEPTUNE II (INSTRUMENTS) ×2 IMPLANT
NDL HD SCORPION MEGA LOADER (NEEDLE) IMPLANT
NDL SUT 6 .5 CRC .975X.05 MAYO (NEEDLE) IMPLANT
PACK ARTHROSCOPY DSU (CUSTOM PROCEDURE TRAY) ×2 IMPLANT
PACK BASIN DAY SURGERY FS (CUSTOM PROCEDURE TRAY) ×2 IMPLANT
PAD COLD SHLDR WRAP-ON (PAD) ×2 IMPLANT
RESTRAINT HEAD UNIVERSAL NS (MISCELLANEOUS) ×2 IMPLANT
SHEET MEDIUM DRAPE 40X70 STRL (DRAPES) ×2 IMPLANT
SLEEVE SCD COMPRESS KNEE MED (STOCKING) ×2 IMPLANT
SUT BROADBAND TAPE 2PK 1.5 (SUTURE) IMPLANT
SUT ETHILON 3 0 PS 1 (SUTURE) ×2 IMPLANT
SUT PDS AB 1 CT 36 (SUTURE) IMPLANT
SUT TIGER TAPE 7 IN WHITE (SUTURE) IMPLANT
SUT VIC AB 0 CT2 27 (SUTURE) IMPLANT
SUT VIC AB 2-0 SH 27XBRD (SUTURE) IMPLANT
SUTURE FIBERWR #2 38 T-5 BLUE (SUTURE) IMPLANT
SUTURE TAPE 1.3 40 TPR END (SUTURE) IMPLANT
SUTURE TAPE TIGERLINK 1.3MM BL (SUTURE) IMPLANT
TAPE FIBER 2MM 7IN #2 BLUE (SUTURE) IMPLANT
TENDON TAPESTRY RC STS 28X28 (Orthopedic Implant) IMPLANT
TOWEL GREEN STERILE FF (TOWEL DISPOSABLE) ×4 IMPLANT
TUBE CONNECTING 20X1/4 (TUBING) ×2 IMPLANT
TUBING ARTHROSCOPY IRRIG 16FT (MISCELLANEOUS) ×2 IMPLANT
WAND ABLATOR APOLLO I90 (BUR) ×2 IMPLANT

## 2024-02-19 NOTE — Anesthesia Procedure Notes (Signed)
 Procedure Name: Intubation Date/Time: 02/19/2024 2:47 PM  Performed by: Lonia Ro, CRNAPre-anesthesia Checklist: Patient identified, Emergency Drugs available, Suction available, Patient being monitored and Timeout performed Patient Re-evaluated:Patient Re-evaluated prior to induction Preoxygenation: Pre-oxygenation with 100% oxygen Induction Type: IV induction Ventilation: Mask ventilation without difficulty Laryngoscope Size: Mac and 4 Grade View: Grade I Tube type: Oral Tube size: 7.5 mm Number of attempts: 1 Airway Equipment and Method: Stylet Placement Confirmation: ETT inserted through vocal cords under direct vision, positive ETCO2 and breath sounds checked- equal and bilateral Secured at: 22 cm Tube secured with: Tape Dental Injury: Teeth and Oropharynx as per pre-operative assessment

## 2024-02-19 NOTE — Transfer of Care (Signed)
 Immediate Anesthesia Transfer of Care Note  Patient: Kyle Lawson  Procedure(s) Performed: RIGHT SHOULDER ARTHROSCOPY (Right: Shoulder) SUBPECTORAL BICEPS TENODESIS AND COLLAGEN PATCH AUGMENTATION (Right: Shoulder)  Patient Location: PACU  Anesthesia Type:General  Level of Consciousness: awake, alert , and patient cooperative  Airway & Oxygen Therapy: Patient Spontanous Breathing and Patient connected to nasal cannula oxygen  Post-op Assessment: Report given to RN and Post -op Vital signs reviewed and stable  Post vital signs: Reviewed and stable  Last Vitals:  Vitals Value Taken Time  BP    Temp    Pulse 78 02/19/24 16:27  Resp    SpO2 98 % 02/19/24 16:27  Vitals shown include unfiled device data.  Last Pain:  Vitals:   02/19/24 1335  PainSc: 0-No pain      Patients Stated Pain Goal: 5 (02/19/24 1231)  Complications: No notable events documented.

## 2024-02-19 NOTE — Anesthesia Procedure Notes (Signed)
 Anesthesia Regional Block: Interscalene brachial plexus block   Pre-Anesthetic Checklist: , timeout performed,  Correct Patient, Correct Site, Correct Laterality,  Correct Procedure, Correct Position, site marked,  Risks and benefits discussed,  Surgical consent,  Pre-op evaluation,  At surgeon's request and post-op pain management  Laterality: Right  Prep: Maximum Sterile Barrier Precautions used, chloraprep       Needles:  Injection technique: Single-shot  Needle Type: Echogenic Stimulator Needle     Needle Length: 9cm  Needle Gauge: 22     Additional Needles:   Procedures:,,,, ultrasound used (permanent image in chart),,    Narrative:  Start time: 02/19/2024 1:40 PM End time: 02/19/2024 1:45 PM Injection made incrementally with aspirations every 5 mL.  Performed by: Personally  Anesthesiologist: Jacquelyne Matte, DO  Additional Notes: Monitors applied. No increased pain on injection. No increased resistance to injection. Injection made in 5cc increments. Good needle visualization. Patient tolerated procedure well.

## 2024-02-19 NOTE — Interval H&P Note (Signed)
 History and Physical Interval Note:  02/19/2024 12:15 PM  Kyle Lawson  has presented today for surgery, with the diagnosis of RIGHT PROXIMAL BICEPS TEAR.  The various methods of treatment have been discussed with the patient and family. After consideration of risks, benefits and other options for treatment, the patient has consented to  Procedure(s) with comments: ARTHROSCOPY, SHOULDER, WITH ROTATOR CUFF REPAIR (Right) - RIGHT SHOULDER ARTHROSCOPY WITH SUBPECTORAL BICEPS TENODESIS AND COLLAGEN PATCH AUGMENTATION TENODESIS, BICEPS (Right) as a surgical intervention.  The patient's history has been reviewed, patient examined, no change in status, stable for surgery.  I have reviewed the patient's chart and labs.  Questions were answered to the patient's satisfaction.     Chrystie Hagwood

## 2024-02-19 NOTE — Discharge Instructions (Addendum)
 Discharge Instructions    Attending Surgeon: Wilhelmenia Harada, MD Office Phone Number: [phone]   Diagnosis and Procedures:    Surgeries Performed: Right shoulder arthroscopy with subpectoral biceps tenodesis  Discharge Plan:    Diet: Resume usual diet. Begin with light or bland foods.  Drink plenty of fluids.  Activity:  Keep sling and dressing in place until your follow up visit in Physical Therapy You are advised to go home directly from the hospital or surgical center. Restrict your activities.  GENERAL INSTRUCTIONS: 1.  Keep your surgical site elevated above your heart for at least 5-7 days or longer to prevent swelling. This will improve your comfort and your overall recovery following surgery.     2. Please call Dr. Verline Glow office at [phone] with questions Monday-Friday during business hours. If no one answers, please leave a message and someone should get back to the patient within 24 hours. For emergencies please call 911 or proceed to the emergency room.   3. Patient to notify surgical team if experiences any of the following: Bowel/Bladder dysfunction, uncontrolled pain, nerve/muscle weakness, incision with increased drainage or redness, nausea/vomiting and Fever greater than 101.0 F.  Be alert for signs of infection including redness, streaking, odor, fever or chills. Be alert for excessive pain or bleeding and notify your surgeon immediately.  WOUND INSTRUCTIONS:   Leave your dressing/cast/splint in place until your post operative visit.  Keep it clean and dry.  Always keep the incision clean and dry until the staples/sutures are removed. If there is no drainage from the incision you should keep it open to air. If there is drainage from the incision you must keep it covered at all times until the drainage stops  Do not soak in a bath tub, hot tub, pool, lake or other body of water until 21 days after your surgery and your incision is completely dry and  healed.  If you have removable sutures (or staples) they must be removed 10-14 days (unless otherwise instructed) from the day of your surgery.     1)  Elevate the extremity as much as possible.  2)  Keep the dressing clean and dry.  3)  Please call us  if the dressing becomes wet or dirty.  4)  If you are experiencing worsening pain or worsening swelling, please call.     MEDICATIONS: Resume all previous home medications at the previous prescribed dose and frequency unless otherwise noted Start taking the  pain medications on an as-needed basis as prescribed  Please taper down pain medication over the next week following surgery.  Ideally you should not require a refill of any narcotic pain medication.  Take pain medication with food to minimize nausea. In addition to the prescribed pain medication, you may take over-the-counter pain relievers such as Tylenol .  Do NOT take additional tylenol  if your pain medication already has tylenol  in it.  Aspirin  325mg  daily per bottle instructions. Narcotic Policy: Per [facility] clinic policy, our goal is ensure optimal postoperative pain control with a multimodal pain management strategy. For all [facility] patients, our goal is to wean post-operative narcotic medications by 6 weeks post-operatively, and many times sooner. If this is not possible due to utilization of pain medication prior to surgery, your [facility] doctor will support your acute post-operative pain control for the first 6 weeks postoperatively, with a plan to transition you back to your primary pain team following that. Max Spain will work to ensure a Therapist, occupational.  FOLLOWUP INSTRUCTIONS: 1. Follow up at the Physical Therapy Clinic 3-4 days following surgery. This appointment should be scheduled unless other arrangements have been made.The Physical Therapy scheduling number is [phone] if an appointment has not already been arranged.  2. Contact Dr. Verline Glow office during  office hours at [phone] or the practice after hours line at [phone] for non-emergencies. For medical emergencies call 911.  May take tylenol  after 6:40pm. May take meloxicam  at 11:00pm.   Discharge Location: Home  Regional Anesthesia Blocks  1. You may not be able to move or feel the blocked extremity after a regional anesthetic block. This may last may last from 3-48 hours after placement, but it will go away. The length of time depends on the medication injected and your individual response to the medication. As the nerves start to wake up, you may experience tingling as the movement and feeling returns to your extremity. If the numbness and inability to move your extremity has not gone away after 48 hours, please call your surgeon.   2. The extremity that is blocked will need to be protected until the numbness is gone and the strength has returned. Because you cannot feel it, you will need to take extra care to avoid injury. Because it may be weak, you may have difficulty moving it or using it. You may not know what position it is in without looking at it while the block is in effect.  3. For blocks in the legs and feet, returning to weight bearing and walking needs to be done carefully. You will need to wait until the numbness is entirely gone and the strength has returned. You should be able to move your leg and foot normally before you try and bear weight or walk. You will need someone to be with you when you first try to ensure you do not fall and possibly risk injury.  4. Bruising and tenderness at the needle site are common side effects and will resolve in a few days.  5. Persistent numbness or new problems with movement should be communicated to the surgeon or the [facility] [phone] [facility] [phone]).  Information for Discharge Teaching: EXPAREL  (bupivacaine  liposome injectable suspension)   Pain relief is important to your  recovery. The goal is to control your pain so you can move easier and return to your normal activities as soon as possible after your procedure. Your physician may use several types of medicines to manage pain, swelling, and more.  Your surgeon or anesthesiologist gave you EXPAREL (bupivacaine ) to help control your pain after surgery.  EXPAREL  is a local anesthetic designed to release slowly over an extended period of time to provide pain relief by numbing the tissue around the surgical site. EXPAREL  is designed to release pain medication over time and can control pain for up to 72 hours. Depending on how you respond to EXPAREL , you may require less pain medication during your recovery. EXPAREL  can help reduce or eliminate the need for opioids during the first few days after surgery when pain relief is needed the most. EXPAREL  is not an opioid and is not addictive. It does not cause sleepiness or sedation.   Important! A teal colored band has been placed on your arm with the date, time and amount of EXPAREL  you have received. Please leave this armband in place for the full 96 hours following administration, and then you may remove the band. If you return to the hospital for any  reason within 96 hours following the administration of EXPAREL , the armband provides important information that your health care providers to know, and alerts them that you have received this anesthetic.    Possible side effects of EXPAREL : Temporary loss of sensation or ability to move in the area where medication was injected. Nausea, vomiting, constipation Rarely, numbness and tingling in your mouth or lips, lightheadedness, or anxiety may occur. Call your doctor right away if you think you may be experiencing any of these sensations, or if you have other questions regarding possible side effects.  Follow all other discharge instructions given to you by your surgeon or nurse. Eat a healthy diet and drink plenty of water or  other fluids.   Postoperative Anesthesia Instructions-Pediatric  Activity: Your child should rest for the remainder of the day. A responsible individual must stay with your child for 24 hours.  Meals: Your child should start with liquids and light foods such as gelatin or soup unless otherwise instructed by the physician. Progress to regular foods as tolerated. Avoid spicy, greasy, and heavy foods. If nausea and/or vomiting occur, drink only clear liquids such as apple juice or Pedialyte until the nausea and/or vomiting subsides. Call your physician if vomiting continues.  Special Instructions/Symptoms: Your child may be drowsy for the rest of the day, although some children experience some hyperactivity a few hours after the surgery. Your child may also experience some irritability or crying episodes due to the operative procedure and/or anesthesia. Your child's throat may feel dry or sore from the anesthesia or the breathing tube placed in the throat during surgery. Use throat lozenges, sprays, or ice chips if needed.

## 2024-02-19 NOTE — Anesthesia Postprocedure Evaluation (Signed)
 Anesthesia Post Note  Patient: Kyle Lawson  Procedure(s) Performed: RIGHT SHOULDER ARTHROSCOPY (Right: Shoulder) SUBPECTORAL BICEPS TENODESIS AND COLLAGEN PATCH AUGMENTATION (Right: Shoulder)     Patient location during evaluation: PACU Anesthesia Type: General and Regional Level of consciousness: awake and alert, oriented and patient cooperative Pain management: pain level controlled Vital Signs Assessment: post-procedure vital signs reviewed and stable Respiratory status: spontaneous breathing, nonlabored ventilation and respiratory function stable Cardiovascular status: blood pressure returned to baseline and stable Postop Assessment: no apparent nausea or vomiting Anesthetic complications: no   No notable events documented.  Last Vitals:  Vitals:   02/19/24 1630 02/19/24 1631  BP: (!) 134/91   Pulse: 73 69  Resp:  12  Temp: 36.4 C   SpO2: 99% 98%    Last Pain:  Vitals:   02/19/24 1335  PainSc: 0-No pain                 Jacquelyne Matte

## 2024-02-19 NOTE — Anesthesia Preprocedure Evaluation (Addendum)
 Anesthesia Evaluation  Patient identified by MRN, date of birth, ID band Patient awake    Reviewed: Allergy & Precautions, NPO status , Patient's Chart, lab work & pertinent test results  Airway Mallampati: II  TM Distance: >3 FB Neck ROM: Full    Dental  (+) Teeth Intact, Dental Advisory Given   Pulmonary neg pulmonary ROS   Pulmonary exam normal breath sounds clear to auscultation       Cardiovascular hypertension (152/98 preop), Pt. on medications Normal cardiovascular exam Rhythm:Regular Rate:Normal  Echo 2022  1. Left ventricular ejection fraction, by estimation, is 55 to 60%. Left  ventricular ejection fraction by 3D volume is 56 %. The left ventricle has  normal function. The left ventricle has no regional wall motion  abnormalities. Left ventricular diastolic   parameters are consistent with Grade I diastolic dysfunction (impaired  relaxation). The average left ventricular global longitudinal strain is  -23.0 %. The global longitudinal strain is normal.   2. Right ventricular systolic function is normal. The right ventricular  size is normal. There is normal pulmonary artery systolic pressure.   3. The mitral valve is normal in structure. No evidence of mitral valve  regurgitation. No evidence of mitral stenosis.   4. The aortic valve is tricuspid. Aortic valve regurgitation is trivial.  No aortic stenosis is present.   5. Aortic dilatation noted. There is mild dilatation of the ascending  aorta, measuring 41 mm.   6. The inferior vena cava is normal in size with greater than 50%  respiratory variability, suggesting right atrial pressure of 3 mmHg.   Thoracic AAA, serial scans- last one 2025: Aneurysm of the tubular ascending thoracic aorta measuring 4.2 cm, previously 4.0 cm.     Neuro/Psych  negative psych ROS   GI/Hepatic negative GI ROS, Neg liver ROS,,,  Endo/Other  negative endocrine ROS     Renal/GU negative Renal ROS  negative genitourinary   Musculoskeletal  (+) Arthritis , Osteoarthritis,  Multiple lumbar surgeries    Abdominal   Peds  Hematology negative hematology ROS (+)   Anesthesia Other Findings   Reproductive/Obstetrics negative OB ROS                             Anesthesia Physical Anesthesia Plan  ASA: 2  Anesthesia Plan: General   Post-op Pain Management: Regional block* and Tylenol  PO (pre-op)*   Induction: Intravenous  PONV Risk Score and Plan: 2 and Ondansetron , Dexamethasone , Midazolam  and Treatment may vary due to age or medical condition  Airway Management Planned: Oral ETT  Additional Equipment: None  Intra-op Plan:   Post-operative Plan: Extubation in OR  Informed Consent: I have reviewed the patients History and Physical, chart, labs and discussed the procedure including the risks, benefits and alternatives for the proposed anesthesia with the patient or authorized representative who has indicated his/her understanding and acceptance.     Dental advisory given  Plan Discussed with: CRNA  Anesthesia Plan Comments: (Last airway note: Induction Type: IV induction Ventilation: Mask ventilation without difficulty Laryngoscope Size: Mac and 4 Grade View: Grade I Tube type: Oral Tube size: 7.5 mm Number of attempts: 1 )       Anesthesia Quick Evaluation

## 2024-02-19 NOTE — Progress Notes (Addendum)
 Assisted Dr. Wendy Hamel with right, Interscalene, ultrasound guided block. Side rails up, monitors on throughout procedure. See vital signs in flow sheet. Tolerated Procedure well.

## 2024-02-19 NOTE — Brief Op Note (Signed)
   Brief Op Note  Date of Surgery: 02/19/2024  Preoperative Diagnosis: RIGHT PROXIMAL BICEPS TEAR  Postoperative Diagnosis: same  Procedure: Procedure(s): RIGHT SHOULDER ARTHROSCOPY SUBPECTORAL BICEPS TENODESIS AND COLLAGEN PATCH AUGMENTATION  Implants: Implant Name Type Inv. Item Serial No. Manufacturer Lot No. LRB No. Used Action  ANCHOR DUAL INSERTER - [id number] Anchor ANCHOR DUAL INSERTER  ZIMMER RECON(ORTH,TRAU,BIO,SG) [id number] Right 2 Implanted  TENDON TAPESTRY RC STS [id number] - [id number] Orthopedic Implant TENDON TAPESTRY RC STS [id number]  ZIMMER RECON(ORTH,TRAU,BIO,SG) [id number] Right 1 Implanted  ANCHOR QUATTRO LINK KNTLS 2.9 - [id number] Anchor ANCHOR Dani Dupont KNTLS 2.9  ZIMMER RECON(ORTH,TRAU,BIO,SG) [id number] Right 1 Implanted    Surgeons: Surgeon(s): Wilhelmenia Harada, MD  Anesthesia: General    Estimated Blood Loss: See anesthesia record  Complications: None  Condition to PACU: Stable  Carmina Chris, MD 02/19/2024 4:19 PM

## 2024-02-19 NOTE — Op Note (Signed)
 Date of Surgery: 02/19/2024  INDICATIONS: Kyle Lawson is a 68 y.o.-year-old male with rotator cuff tendinitis and biceps tear approximately.  The risk and benefits of the procedure were discussed in detail and documented in the pre-operative evaluation.   PREOPERATIVE DIAGNOSES: Right shoulder, biceps tendinitis and subacromial impingement.  POSTOPERATIVE DIAGNOSIS: Same.  PROCEDURE: Open subpectoral biceps tenodesis - 29562 Arthroscopic rotator cuff repair - 13086  SURGEON: Carmina Chris MD  ASSISTANT: Naida Austria, ATC  ANESTHESIA:  general  IV FLUIDS AND URINE: See anesthesia record.  ANTIBIOTICS: Ancef   ESTIMATED BLOOD LOSS: 5 mL.  IMPLANTS:  Implant Name Type Inv. Item Serial No. Manufacturer Lot No. LRB No. Used Action  ANCHOR DUAL INSERTER - [id number] Anchor ANCHOR DUAL INSERTER  ZIMMER RECON(ORTH,TRAU,BIO,SG) [id number] Right 2 Implanted  TENDON TAPESTRY RC STS [id number] - [id number] Orthopedic Implant TENDON TAPESTRY RC STS [id number]  ZIMMER [employer]ON(ORTH,TRAU,BIO,SG) [id number] Right 1 Implanted  ANCHOR QUATTRO LINK KNTLS 2.9 - [id number] Anchor ANCHOR QUATTRO LINK KNTLS 2.9  ZIMMER RECON(ORTH,TRAU,BIO,SG) [id number] Right 1 Implanted    DRAINS: None  CULTURES: None  COMPLICATIONS: none  PROCEDURE:    OPERATIVE FINDING: Exam under anesthesia:   Examination under anesthesia revealed forward elevation of 150 degrees.  With the arm at the side, there was 65 degrees of external rotation.  There is a 1+ anterior load shift and a 1+ posterior load shift.    Arthroscopic findings demonstrated: Articular space: Rotator interval intact Chondral surfaces: Normal Biceps: Torn at the subscapularis insertion with proximal stump Subscapularis: Intact Supraspinatus: Bursal sided tearing less than 10% Infraspinatus: Intact    I identified the patient in the pre-operative holding area.  I marked the operative right shoulder with my initials. I reviewed the risks and benefits of the  proposed surgical intervention and the patient wished to proceed.  Anesthesia was then performed with regional block.  The patient was transferred to the operative suite and placed in the beach chair position with all bony prominences padded.     SCDs were placed on bilateral lower extremity. Appropriate antibiotics was administered within 1 hour before incision.  Anesthesia was induced.  The operative extremity was then prepped and draped in standard fashion. A time out was performed confirming the correct extremity, correct patient and correct procedure.   The arthroscope was introduced in the glenohumeral joint from a posterior portal.  An anterior portal was created.  The shoulder was examined and the above findings were noted.      The remaining biceps stump was debrided back to the insertion on the labrum.  This was shrunk with electrocautery.  The rotator cuff was then approached through the subacromial space. Anterior, lateral, and posterior portals were used.  Bursectomy was performed with an arthroscopic shaver and ArthroCare wand.  Given the approximate 10% bursal tearing the decision was made to perform collagen patch augmentation.  At this time the collagen patch was introduced to the anterior portal and this was stapled in place at the corners using the stapler device from the lateral portal.  Fluid was evacuated and blood was allowed to ingress into the patch.   At this time an anterior approach was made to the biceps right distal to the pectoralis insertion.  The stump of the biceps was identified and grasped using an Allis.  A Krakw style suture was then used with a #2 tape.  This had excellent bite of the tendon.  This was then placed right below the pectoralis into the humerus with a  2.9 Quatro link anchor.  The pilot hole was created with an awl and then the anchor was placed and tensioned with excellent restoration of the biceps mechanism.   The shoulder was irrigated.  The  arthroscopic instruments were removed.  Wounds were closed with 3-0 nylon sutures.  A sterile dressing was applied with xeroform, 4x8s, abdominal pad, and tape. An Irineo Manns was placed and the upper extremity was placed in a shoulder immobilizer.  The patient tolerated the procedure well and was taken to the recovery room in stable condition.  All counts were correct in the case. The patient tolerated the procedure well and was taken to the recovery room in stable condition.    POSTOPERATIVE PLAN: He will follow the biceps repair protocol.  He will be allowed to come out of the sling for active range of motion once his nerve block resolved.  I will see him back in 2 weeks for suture removal.  He will be on aspirin  until this time  Carmina Chris, MD 4:21 PM

## 2024-02-21 ENCOUNTER — Encounter (HOSPITAL_BASED_OUTPATIENT_CLINIC_OR_DEPARTMENT_OTHER): Payer: Self-pay | Admitting: Orthopaedic Surgery

## 2024-02-23 ENCOUNTER — Encounter: Payer: Self-pay | Admitting: Physical Therapy

## 2024-02-23 ENCOUNTER — Ambulatory Visit: Admitting: Physical Therapy

## 2024-02-23 DIAGNOSIS — M25511 Pain in right shoulder: Secondary | ICD-10-CM | POA: Diagnosis not present

## 2024-02-23 DIAGNOSIS — M6281 Muscle weakness (generalized): Secondary | ICD-10-CM

## 2024-02-23 NOTE — Therapy (Signed)
 OUTPATIENT PHYSICAL THERAPY UPPER EXTREMITY EVALUATION   Patient Name: Kyle Lawson MRN: [id number] DOB:08-18-1956, 68 y.o., male Today's Date: 02/23/2024  END OF SESSION:  PT End of Session - 02/23/24 1526     Visit Number 1    Number of Visits 24    Date for PT Re-Evaluation 05/17/24    Authorization Type Medicare    PT Start Time 0848    PT Stop Time 0933    PT Time Calculation (min) 45 min    Activity Tolerance Patient tolerated treatment well    Behavior During Therapy [facility] for tasks assessed/performed          Past Medical History:  Diagnosis Date   Arthritis    Gout    History of kidney stones    Hyperlipidemia    Hypertension    Thoracic aortic aneurysm (HCC) 2022   stable 4cm by CTA 12/2022   Past Surgical History:  Procedure Laterality Date   Anterior Cervical Decompression Fusion  2019   BICEPT TENODESIS Right 02/19/2024   Procedure: SUBPECTORAL BICEPS TENODESIS AND COLLAGEN PATCH AUGMENTATION;  Surgeon: Wilhelmenia Harada, MD;  Location: [facility];  Service: Orthopedics;  Laterality: Right;   DISTAL CLAVICLE EXCISION Right 2020   Right Shoulder Distal Clavicle Excision   JOINT REPLACEMENT  2008, 2013   Bilateral hip replacements   LAMINECTOMY  1984   L4-L5   LAMINECTOMY  1996   L3-L4   LUMBAR FUSION  01/11/2021   may 2022- L3-L5   LUMBAR FUSION  09/29/2021   L2-L3   SHOULDER ARTHROSCOPY WITH ROTATOR CUFF REPAIR Right 02/19/2024   Procedure: RIGHT SHOULDER ARTHROSCOPY;  Surgeon: Wilhelmenia Harada, MD;  Location: [facility];  Service: Orthopedics;  Laterality: Right;   SPINE SURGERY  2019, 2022, 2023   C4-C6 fusion, L2-L5 fusion   TOTAL HIP ARTHROPLASTY Left    left hip 09/2006   TOTAL HIP ARTHROPLASTY  09/14/2011   Right. Procedure: TOTAL HIP ARTHROPLASTY;  Surgeon: Ferd Householder, MD;  Location: [facility] OR;  Service: Orthopedics;  Laterality: Right;   Patient Active Problem List   Diagnosis Date Noted   Biceps rupture,  proximal, right, initial encounter 02/19/2024   Nontraumatic incomplete tear of right rotator cuff 02/19/2024   Fatty liver 05/20/2022   Essential hypertension 11/04/2021   Hyperlipidemia, unspecified 11/04/2021   Ascending aorta dilation (HCC) 11/04/2021   Spinal stenosis of lumbar region 09/29/2021   HNP (herniated nucleus pulposus), lumbar 01/11/2021   Acromioclavicular joint pain 07/13/2019   Sternum pain 01/16/2019   Right anterior shoulder pain 11/08/2017   Neck pain 07/04/2017   Gout 02/05/2017   Lumbar spondylosis 01/19/2017   Acute bilateral low back pain with bilateral sciatica 01/19/2017    PCP: Clarisa Crooked  REFERRING PROVIDER: Davia Erps   REFERRING DIAG: s/p R bicep tenodesis and collagen augmentation   THERAPY DIAG:  Acute pain of right shoulder  Muscle weakness (generalized)  Rationale for Evaluation and Treatment: Rehabilitation  ONSET DATE: 02/19/24  SUBJECTIVE:  SUBJECTIVE STATEMENT: 6/16 DOS: SUBPECTORAL BICEPS TENODESIS AND COLLAGEN PATCH AUGMENTATION;  Doing well since surgery, eating well, trying to sleep, has been propped up in bed with pillow under arm. Wearing sling today. He re-dressed his incisions yesterday to shower. Pt is retired, but very active. He has been able to resume walking in the neighborhood.   Hand dominance: Right  PERTINENT HISTORY: HTN, Thoracic aortic aneurysm, Bil THA, Lumbar: 2 previous laminectomy and 2 fusions L2-5. Cervical ACDF/ fusion C4-6;  R shoulder DCE 2020;   PAIN:  Are you having pain? Yes: NPRS scale: 3-5/10 Pain location: R shoulder  Pain description: sore Aggravating factors: none stated  Relieving factors: none stated   PRECAUTIONS: See protocol for surgical precautions   RED FLAGS: None   WEIGHT BEARING RESTRICTIONS:  Yes, limited for R UE  FALLS:  Has patient fallen in last 6 months? No  PLOF: Independent  PATIENT GOALS: Decreased pain in R shoulder/arm, improved use and reaching.   NEXT MD VISIT:   OBJECTIVE:   DIAGNOSTIC FINDINGS:    PATIENT SURVEYS :    COGNITION: Overall cognitive status: Within functional limits for tasks assessed     SENSATION: WFL  POSTURE:   UPPER EXTREMITY ROM:   Active  ROM Right eval Left eval  Shoulder flexion Prom: up to 85 deg today, (bent elbow)   Shoulder extension    Shoulder abduction    Shoulder adduction    Shoulder internal rotation    Shoulder external rotation    Elbow flexion WFL   Elbow extension Not tested to full extension   Wrist flexion    Wrist extension    Wrist ulnar deviation    Wrist radial deviation    Wrist pronation    Wrist supination    (Blank rows = not tested)  UPPER EXTREMITY MMT:  not tested due to surgery   MMT Right eval Left eval  Shoulder flexion    Shoulder extension    Shoulder abduction    Shoulder adduction    Shoulder internal rotation    Shoulder external rotation    Middle trapezius    Lower trapezius    Elbow flexion    Elbow extension    Wrist flexion    Wrist extension    Wrist ulnar deviation    Wrist radial deviation    Wrist pronation    Wrist supination    Grip strength (lbs)    (Blank rows = not tested)  SHOULDER SPECIAL TESTS:   JOINT MOBILITY TESTING:  Mild hypomobility  PALPATION:  Dressing removed today, sterile gauze and tegaderm placed over stitches; Healing incisions around R shoulder, stiches in place.     TODAY'S TREATMENT:                                                                                                                                         DATE:  02/23/2024  Therapeutic Exercise: Aerobic: Supine: prom for flexion up to 90 deg (elbow bent),  IR/ER all within protocol guidelines.  Seated: elbow aarom, 2 hand hold/light , not to full  extension; wrist flex/ext, light grip x 10;  Standing: Stretches:  Neuromuscular Re-education: Manual Therapy: Therapeutic Activity: Self Care: education on dressing, showering, sling use and proper fit, and current rom and movement limitations per protocol.    PATIENT EDUCATION:  Education details: PT POC, Exam findings, HEP Person educated: Patient Education method: Explanation, Demonstration, Tactile cues, Verbal cues, and Handouts Education comprehension: verbalized understanding, returned demonstration, verbal cues required, tactile cues required, and needs further education   HOME EXERCISE PROGRAM: Access Code: [email or web address] URL: [email or web address] Date: 02/23/2024 Prepared by: Terrilee Few  Exercises - Supine Shoulder Flexion AAROM  - 2 x daily - 1 sets - 10-12 reps - Supported Elbow Flexion Extension PROM  - 2 x daily - 1 sets - 10-15 reps - Wrist Extension AROM  - 2 x daily - 1 sets - 10 reps  ASSESSMENT:  CLINICAL IMPRESSION: Eval:  Patient presents with deficits following R shoulder surgery on 6/16 for bicep tenodesis and collagen patch for rotator cuff tear. Pt with healing incisions, dressed today. Pt with good tolerance for initial movement and prom. Instructed on post op precautions , sling wear and rom limitations to follow at this time. Pt with decreased ability for full functional activities, reaching, lifting, carrying, and IADLs. He has inability for functional use of ue at this tim. Pt to benefit from skilled PT to improve deficits and pain. He will return for 1 more visit, then is going out of state for 1 month.    OBJECTIVE IMPAIRMENTS: decreased activity tolerance, decreased knowledge of use of DME, decreased mobility, decreased ROM, decreased strength, increased muscle spasms, impaired flexibility, impaired UE functional use, and pain.   ACTIVITY LIMITATIONS: carrying, lifting, bathing, toileting, dressing, self feeding, reach over head,  hygiene/grooming, and locomotion level  PARTICIPATION LIMITATIONS: meal prep, cleaning, laundry, driving, shopping, community activity, and yard work  PERSONAL FACTORS: 1 comorbidity: none are also affecting patient's functional outcome.   REHAB POTENTIAL: Good  CLINICAL DECISION MAKING: Stable/uncomplicated  EVALUATION COMPLEXITY: Low  GOALS: Goals reviewed with patient? Yes   SHORT TERM GOALS: Target date: 03/08/2024   Pt to be independent with initial HEP  Goal status: INITIAL  2.  Pt to understand post op guidelines for next 4 weeks while is out of state.   Goal status: INITIAL    LONG TERM GOALS: Target date: 05/17/2024  Pt to be independent with final HEP  Goal status: INITIAL  2.  Pt to demo improved AROM to be [facility] and pain free, to improve ability for ADLs.   Goal status: INITIAL  3.  Pt to demo improved strength to be at least 4/5, to improve ability for reach, lift, and IADLs.   Goal status: INITIAL  4.  Pt to repots decreased pain to 0-2/10 with activity and use of R UE.   Goal status: INITIAL    PLAN: PT FREQUENCY: 1-2x/week  PT DURATION: 12 weeks  PLANNED INTERVENTIONS: Therapeutic exercises, Therapeutic activity, Neuromuscular re-education, Patient/Family education, Self Care, Joint mobilization, Joint manipulation, Stair training, DME instructions, Aquatic Therapy, Dry Needling, Electrical stimulation, Cryotherapy, Moist heat, Taping, Ultrasound, Ionotophoresis 4mg /ml Dexamethasone , Manual therapy,  Vasopneumatic device, Traction, Spinal manipulation, Spinal mobilization,    PLAN FOR NEXT SESSION:    Terrilee Few, PT, DPT 3:55 PM  02/23/24

## 2024-02-28 ENCOUNTER — Encounter: Payer: Self-pay | Admitting: Physical Therapy

## 2024-02-28 ENCOUNTER — Ambulatory Visit (INDEPENDENT_AMBULATORY_CARE_PROVIDER_SITE_OTHER): Admitting: Physical Therapy

## 2024-02-28 DIAGNOSIS — M6281 Muscle weakness (generalized): Secondary | ICD-10-CM

## 2024-02-28 DIAGNOSIS — M25511 Pain in right shoulder: Secondary | ICD-10-CM

## 2024-02-28 NOTE — Therapy (Signed)
 OUTPATIENT PHYSICAL THERAPY UPPER EXTREMITY TREATMENT   Patient Name: Kyle Lawson MRN: [id number] DOB:11/15/1955, 68 y.o., male Today's Date: 02/28/2024  END OF SESSION:  PT End of Session - 02/28/24 1239     Visit Number 2    Number of Visits 24    Date for PT Re-Evaluation 05/17/24    Authorization Type Medicare    PT Start Time 1105    PT Stop Time 1150    PT Time Calculation (min) 45 min    Activity Tolerance Patient tolerated treatment well    Behavior During Therapy [facility] for tasks assessed/performed           Past Medical History:  Diagnosis Date   Arthritis    Gout    History of kidney stones    Hyperlipidemia    Hypertension    Thoracic aortic aneurysm (HCC) 2022   stable 4cm by CTA 12/2022   Past Surgical History:  Procedure Laterality Date   Anterior Cervical Decompression Fusion  2019   BICEPT TENODESIS Right 02/19/2024   Procedure: SUBPECTORAL BICEPS TENODESIS AND COLLAGEN PATCH AUGMENTATION;  Surgeon: Genelle Standing, MD;  Location: [facility];  Service: Orthopedics;  Laterality: Right;   DISTAL CLAVICLE EXCISION Right 2020   Right Shoulder Distal Clavicle Excision   JOINT REPLACEMENT  2008, 2013   Bilateral hip replacements   LAMINECTOMY  1984   L4-L5   LAMINECTOMY  1996   L3-L4   LUMBAR FUSION  01/11/2021   may 2022- L3-L5   LUMBAR FUSION  09/29/2021   L2-L3   SHOULDER ARTHROSCOPY WITH ROTATOR CUFF REPAIR Right 02/19/2024   Procedure: RIGHT SHOULDER ARTHROSCOPY;  Surgeon: Genelle Standing, MD;  Location: [facility];  Service: Orthopedics;  Laterality: Right;   SPINE SURGERY  2019, 2022, 2023   C4-C6 fusion, L2-L5 fusion   TOTAL HIP ARTHROPLASTY Left    left hip 09/2006   TOTAL HIP ARTHROPLASTY  09/14/2011   Right. Procedure: TOTAL HIP ARTHROPLASTY;  Surgeon: Toribio JULIANNA Chancy, MD;  Location: [facility] OR;  Service: Orthopedics;  Laterality: Right;   Patient Active Problem List   Diagnosis Date Noted   Biceps rupture,  proximal, right, initial encounter 02/19/2024   Nontraumatic incomplete tear of right rotator cuff 02/19/2024   Fatty liver 05/20/2022   Essential hypertension 11/04/2021   Hyperlipidemia, unspecified 11/04/2021   Ascending aorta dilation (HCC) 11/04/2021   Spinal stenosis of lumbar region 09/29/2021   HNP (herniated nucleus pulposus), lumbar 01/11/2021   Acromioclavicular joint pain 07/13/2019   Sternum pain 01/16/2019   Right anterior shoulder pain 11/08/2017   Neck pain 07/04/2017   Gout 02/05/2017   Lumbar spondylosis 01/19/2017   Acute bilateral low back pain with bilateral sciatica 01/19/2017    PCP: [name]  REFERRING PROVIDER: Garnette Genelle   REFERRING DIAG: s/p R bicep tenodesis and collagen augmentation   THERAPY DIAG:  Acute pain of right shoulder  Muscle weakness (generalized)  Rationale for Evaluation and Treatment: Rehabilitation  ONSET DATE: 02/19/24  SUBJECTIVE:  SUBJECTIVE STATEMENT: Pt states doing well. Has been wearing sling and doing HEP.  6/16 DOS: SUBPECTORAL BICEPS TENODESIS AND COLLAGEN PATCH AUGMENTATION;  Doing well since surgery, eating well, trying to sleep, has been propped up in bed with pillow under arm. Wearing sling today. He re-dressed his incisions yesterday to shower. Pt is retired, but very active. He has been able to resume walking in the neighborhood.   Hand dominance: Right  PERTINENT HISTORY: HTN, Thoracic aortic aneurysm, Bil THA, Lumbar: 2 previous laminectomy and 2 fusions L2-5. Cervical ACDF/ fusion C4-6;  R shoulder DCE 2020;   PAIN:  Are you having pain? Yes: NPRS scale: 3-5/10 Pain location: R shoulder  Pain description: sore Aggravating factors: none stated  Relieving factors: none stated   PRECAUTIONS: See protocol for surgical  precautions   RED FLAGS: None   WEIGHT BEARING RESTRICTIONS: Yes, limited for R UE  FALLS:  Has patient fallen in last 6 months? No  PLOF: Independent  PATIENT GOALS: Decreased pain in R shoulder/arm, improved use and reaching.   NEXT MD VISIT:   OBJECTIVE:   DIAGNOSTIC FINDINGS:    PATIENT SURVEYS :    COGNITION: Overall cognitive status: Within functional limits for tasks assessed     SENSATION: WFL  POSTURE:   UPPER EXTREMITY ROM:   Active  ROM Right eval Left eval  Shoulder flexion Prom: up to 85 deg today, (bent elbow)   Shoulder extension    Shoulder abduction    Shoulder adduction    Shoulder internal rotation    Shoulder external rotation    Elbow flexion WFL   Elbow extension Not tested to full extension   Wrist flexion    Wrist extension    Wrist ulnar deviation    Wrist radial deviation    Wrist pronation    Wrist supination    (Blank rows = not tested)  UPPER EXTREMITY MMT:  not tested due to surgery   MMT Right eval Left eval  Shoulder flexion    Shoulder extension    Shoulder abduction    Shoulder adduction    Shoulder internal rotation    Shoulder external rotation    Middle trapezius    Lower trapezius    Elbow flexion    Elbow extension    Wrist flexion    Wrist extension    Wrist ulnar deviation    Wrist radial deviation    Wrist pronation    Wrist supination    Grip strength (lbs)    (Blank rows = not tested)  SHOULDER SPECIAL TESTS:   JOINT MOBILITY TESTING:  Mild hypomobility  PALPATION:  Dressing removed today, sterile gauze and tegaderm placed over stitches; Healing incisions around R shoulder, stiches in place.     TODAY'S TREATMENT:  DATE:   02/28/2024  Therapeutic Exercise: Aerobic: Supine: prom for flexion  (elbow bent),  IR/ER all within protocol guidelines.   Aarom 2 hand hold flexion x 10,   stick x 10- education for HEP.  Seated: elbow aarom, 2 hand hold/light , not to full extension;  Standing:  scap squeeze- light with elbows bent and limited shoulder extension x 15;  Stretches:  Neuromuscular Re-education: Manual Therapy: Therapeutic Activity: Self Care: education on showering, not going underwater, sling use , and current rom and movement limitations per protocol, as well as rom progressions for next 4 weeks.   Removed  tegaderm over  2 shoulder arthroscopic portals, due to slight redness/skin irritation. Incision looks dry, but skin irritated around it- PA made aware, pt going to Dr this week for f/u.    PATIENT EDUCATION:  Education details: PT POC, Exam findings, HEP Person educated: Patient Education method: Explanation, Demonstration, Tactile cues, Verbal cues, and Handouts Education comprehension: verbalized understanding, returned demonstration, verbal cues required, tactile cues required, and needs further education   HOME EXERCISE PROGRAM: Access Code: [email or web address] URL: [email or web address] Date: 02/23/2024 Prepared by: Tinnie Don  Exercises - Supine Shoulder Flexion AAROM  - 2 x daily - 1 sets - 10-12 reps - Supported Elbow Flexion Extension PROM  - 2 x daily - 1 sets - 10-15 reps - Wrist Extension AROM  - 2 x daily - 1 sets - 10 reps  ASSESSMENT:  CLINICAL IMPRESSION: Pt with good tolerance for exercise today, with no pain. Able to perform aarom for shoulder flexion- discussed performing with no pain, and slow progression over next few weeks while he is away. Reviewed continued post op precautions, he will see MD on Friday and ask him a few questions of his own.  Removed  tegaderm over  2 shoulder arthroscopic portals, due to slight redness/skin irritation. Incision looks dry, but skin irritated around it- PA made aware, pt going to Dr this week for f/u.  Pt will return to PT when he gets back from  vacation.   Eval:  Patient presents with deficits following R shoulder surgery on 6/16 for bicep tenodesis and collagen patch for rotator cuff tear. Pt with healing incisions, dressed today. Pt with good tolerance for initial movement and prom. Instructed on post op precautions , sling wear and rom limitations to follow at this time. Pt with decreased ability for full functional activities, reaching, lifting, carrying, and IADLs. He has inability for functional use of ue at this tim. Pt to benefit from skilled PT to improve deficits and pain. He will return for 1 more visit, then is going out of state for 1 month.    OBJECTIVE IMPAIRMENTS: decreased activity tolerance, decreased knowledge of use of DME, decreased mobility, decreased ROM, decreased strength, increased muscle spasms, impaired flexibility, impaired UE functional use, and pain.   ACTIVITY LIMITATIONS: carrying, lifting, bathing, toileting, dressing, self feeding, reach over head, hygiene/grooming, and locomotion level  PARTICIPATION LIMITATIONS: meal prep, cleaning, laundry, driving, shopping, community activity, and yard work  PERSONAL FACTORS: 1 comorbidity: none are also affecting patient's functional outcome.   REHAB POTENTIAL: Good  CLINICAL DECISION MAKING: Stable/uncomplicated  EVALUATION COMPLEXITY: Low  GOALS: Goals reviewed with patient? Yes   SHORT TERM GOALS: Target date: 03/08/2024   Pt to be independent with initial HEP  Goal status: INITIAL  2.  Pt to understand post op guidelines for next 4 weeks while is out of state.   Goal status: INITIAL    LONG  TERM GOALS: Target date: 05/17/2024  Pt to be independent with final HEP  Goal status: INITIAL  2.  Pt to demo improved AROM to be [facility] and pain free, to improve ability for ADLs.   Goal status: INITIAL  3.  Pt to demo improved strength to be at least 4/5, to improve ability for reach, lift, and IADLs.   Goal status: INITIAL  4.  Pt to repots  decreased pain to 0-2/10 with activity and use of R UE.   Goal status: INITIAL    PLAN: PT FREQUENCY: 1-2x/week  PT DURATION: 12 weeks  PLANNED INTERVENTIONS: Therapeutic exercises, Therapeutic activity, Neuromuscular re-education, Patient/Family education, Self Care, Joint mobilization, Joint manipulation, Stair training, DME instructions, Aquatic Therapy, Dry Needling, Electrical stimulation, Cryotherapy, Moist heat, Taping, Ultrasound, Ionotophoresis 4mg /ml Dexamethasone , Manual therapy,  Vasopneumatic device, Traction, Spinal manipulation, Spinal mobilization,    PLAN FOR NEXT SESSION:    Tinnie Don, PT, DPT 12:39 PM  02/28/24

## 2024-03-01 ENCOUNTER — Ambulatory Visit (HOSPITAL_BASED_OUTPATIENT_CLINIC_OR_DEPARTMENT_OTHER): Admitting: Orthopaedic Surgery

## 2024-03-01 DIAGNOSIS — S46211A Strain of muscle, fascia and tendon of other parts of biceps, right arm, initial encounter: Secondary | ICD-10-CM

## 2024-03-01 NOTE — Progress Notes (Signed)
 Post Operative Evaluation    Procedure/Date of Surgery: Right shoulder arthroscopy with collagen patch augmentation and biceps tenodesis subpectoral 6/16   Interval History:    Presents today 2 weeks status post the above procedure.  Overall he is doing extremely well.  He has been working on overhead range of motion of the shoulder which is going quite nicely.  He is weaning from the sling   PMH/PSH/Family History/Social History/Meds/Allergies:    Past Medical History:  Diagnosis Date   Arthritis    Gout    History of kidney stones    Hyperlipidemia    Hypertension    Thoracic aortic aneurysm (HCC) 2022   stable 4cm by CTA 12/2022   Past Surgical History:  Procedure Laterality Date   Anterior Cervical Decompression Fusion  2019   BICEPT TENODESIS Right 02/19/2024   Procedure: SUBPECTORAL BICEPS TENODESIS AND COLLAGEN PATCH AUGMENTATION;  Surgeon: Genelle Standing, MD;  Location: [facility];  Service: Orthopedics;  Laterality: Right;   DISTAL CLAVICLE EXCISION Right 2020   Right Shoulder Distal Clavicle Excision   JOINT REPLACEMENT  2008, 2013   Bilateral hip replacements   LAMINECTOMY  1984   L4-L5   LAMINECTOMY  1996   L3-L4   LUMBAR FUSION  01/11/2021   may 2022- L3-L5   LUMBAR FUSION  09/29/2021   L2-L3   SHOULDER ARTHROSCOPY WITH ROTATOR CUFF REPAIR Right 02/19/2024   Procedure: RIGHT SHOULDER ARTHROSCOPY;  Surgeon: Genelle Standing, MD;  Location: [facility];  Service: Orthopedics;  Laterality: Right;   SPINE SURGERY  2019, 2022, 2023   C4-C6 fusion, L2-L5 fusion   TOTAL HIP ARTHROPLASTY Left    left hip 09/2006   TOTAL HIP ARTHROPLASTY  09/14/2011   Right. Procedure: TOTAL HIP ARTHROPLASTY;  Surgeon: Toribio JULIANNA Chancy, MD;  Location: [facility];  Service: Orthopedics;  Laterality: Right;   Social History   Socioeconomic History   Marital status: Married    Spouse name: Not on file   Number of children:  2   Years of education: Not on file   Highest education level: Not on file  Occupational History   Not on file  Tobacco Use   Smoking status: Never   Smokeless tobacco: Never  Vaping Use   Vaping status: Never Used  Substance and Sexual Activity   Alcohol use: Yes    Alcohol/week: 4.0 - 5.0 standard drinks of alcohol    Types: 4 - 5 Cans of beer per week    Comment: social   Drug use: Yes    Types: Marijuana    Comment: for pain- very occasionally   Sexual activity: Yes    Partners: Female  Other Topics Concern   Not on file  Social History Narrative   Married 28 years in 2023. Daughter 74, son 72 in 2023.    From [place]       Retired from [employer]- enjoying retirement      Hobbies: traveling, hiking, walks daily, live music   Social Drivers of Health   Financial Resource Strain: Low Risk  (07/26/2023)   Overall Financial Resource Strain (CARDIA)    Difficulty of Paying Living Expenses: Not hard at all  Food Insecurity: No Food Insecurity (07/26/2023)   Hunger Vital Sign    Worried About Programme researcher, broadcasting/film/video  in the Last Year: Never true    Ran Out of Food in the Last Year: Never true  Transportation Needs: No Transportation Needs (07/26/2023)   PRAPARE - Administrator, Civil Service (Medical): No    Lack of Transportation (Non-Medical): No  Physical Activity: Sufficiently Active (07/26/2023)   Exercise Vital Sign    Days of Exercise per Week: 5 days    Minutes of Exercise per Session: 90 min  Stress: No Stress Concern Present (07/26/2023)   [employer] of Occupational Health - Occupational Stress Questionnaire    Feeling of Stress : Not at all  Social Connections: Moderately Isolated (07/26/2023)   Social Connection and Isolation Panel    Frequency of Communication with Friends and Family: More than three times a week    Frequency of Social Gatherings with Friends and Family: More than three times a week    Attends Religious Services: Never     Database administrator or Organizations: No    Attends Engineer, structural: Never    Marital Status: Married   Family History  Problem Relation Age of Onset   Diabetes Mother        passed at 65   Skin cancer Father    Gout Father    Hypertension Father        passed at 42   Hyperlipidemia Father    CAD Brother        CABG 54   Cancer Brother    Diabetes Brother    Healthy Brother    Healthy Brother    Alcoholism Brother    Depression Brother        suicide age 62   Healthy Maternal Grandmother        lived to 83   Brain cancer Maternal Grandfather        age 103- was an MD   CAD Paternal Grandmother        died in 94s   CAD Paternal Grandfather        died in 39s   Obesity Sister    Arthritis Sister    Healthy Sister    No Known Allergies Current Outpatient Medications  Medication Sig Dispense Refill   acetaminophen  (TYLENOL ) 500 MG tablet Take 500 mg by mouth every 6 (six) hours as needed (pain.).     allopurinol  (ZYLOPRIM ) 300 MG tablet TAKE 1 TABLET EVERY MORNING 90 tablet 3   aspirin  EC 325 MG tablet Take 1 tablet (325 mg total) by mouth daily. 14 tablet 0   Cholecalciferol (VITAMIN D -3 PO) Take 2,000 Units by mouth in the morning.     colchicine  0.6 MG tablet Take 1 tablet (0.6 mg total) by mouth daily. At start of flare- take 2 and then an hour later (max 3 day one) if not improved can take 1 more, then daily until gout flare resolves (Patient taking differently: Take 0.6 mg by mouth daily as needed (gout attacks).) 30 tablet 3   cyclobenzaprine  (FLEXERIL ) 10 MG tablet Take 1 tablet (10 mg total) by mouth 3 (three) times daily as needed for muscle spasms. 30 tablet 0   diclofenac  Sodium (PENNSAID ) 2 % SOLN Apply 1-2 Pump topically 2 (two) times daily as needed (pain.).     losartan  (COZAAR ) 100 MG tablet Take 1 tablet (100 mg total) by mouth daily. 90 tablet 3   meloxicam  (MOBIC ) 15 MG tablet Take 1 tablet (15 mg total) by mouth daily. 30 tablet 0  Multiple Vitamin (MULTIVITAMIN WITH MINERALS) TABS tablet Take 1 tablet by mouth in the morning.     oxyCODONE  (ROXICODONE ) 5 MG immediate release tablet Take 1 tablet (5 mg total) by mouth every 4 (four) hours as needed for severe pain (pain score 7-10) or breakthrough pain. 10 tablet 0   simvastatin  (ZOCOR ) 40 MG tablet TAKE 1 TABLET DAILY 90 tablet 3   No current facility-administered medications for this visit.   No results found.  Review of Systems:   A ROS was performed including pertinent positives and negatives as documented in the HPI.   Musculoskeletal Exam:    There were no vitals taken for this visit.  Right shoulder incisions are well-appearing without erythema or drainage.  There is some swelling about the biceps although this does correct with overhead abduction.  Active forward elevation the shoulder is to 90 degrees with external rotation at side to 40 degrees  Imaging:      I personally reviewed and interpreted the radiographs.   Assessment:   2 weeks status post right shoulder arthroscopy and subpectoral biceps tenodesis overall doing extremely well.  He does have some mild residual Popeye although I do believe this is more related to the swelling as I do feel that his tendon is intact.  At this time we will plan to see him back in 4 weeks for reassessment.  He will continue active range of motion overhead as tolerated  Plan :    - Return to clinic 4 weeks for reassessment      I personally saw and evaluated the patient, and participated in the management and treatment plan.  Elspeth Parker, MD Attending Physician, Orthopedic Surgery  This document was dictated using Dragon voice recognition software. A reasonable attempt at proof reading has been made to minimize errors.

## 2024-04-02 ENCOUNTER — Ambulatory Visit (INDEPENDENT_AMBULATORY_CARE_PROVIDER_SITE_OTHER): Admitting: Physical Therapy

## 2024-04-02 ENCOUNTER — Encounter: Payer: Self-pay | Admitting: Physical Therapy

## 2024-04-02 DIAGNOSIS — M6281 Muscle weakness (generalized): Secondary | ICD-10-CM | POA: Diagnosis not present

## 2024-04-02 DIAGNOSIS — M25511 Pain in right shoulder: Secondary | ICD-10-CM | POA: Diagnosis not present

## 2024-04-02 NOTE — Therapy (Signed)
 OUTPATIENT PHYSICAL THERAPY UPPER EXTREMITY TREATMENT   Patient Name: Kyle Lawson MRN: [id number] DOB:[birth year], 68 y.o., male Today's Date: 04/02/2024  END OF SESSION:  PT End of Session - 04/02/24 1004     Visit Number 3    Number of Visits 24    Date for PT Re-Evaluation 05/17/24    Authorization Type Medicare    PT Start Time 1006    PT Stop Time 1050    PT Time Calculation (min) 44 min    Activity Tolerance Patient tolerated treatment well    Behavior During Therapy [facility] for tasks assessed/performed           Past Medical History:  Diagnosis Date   Arthritis    Gout    History of kidney stones    Hyperlipidemia    Hypertension    Thoracic aortic aneurysm (HCC) 2022   stable 4cm by CTA 12/2022   Past Surgical History:  Procedure Laterality Date   Anterior Cervical Decompression Fusion  2019   BICEPT TENODESIS Right 02/19/2024   Procedure: SUBPECTORAL BICEPS TENODESIS AND COLLAGEN PATCH AUGMENTATION;  Surgeon: Genelle Standing, MD;  Location: [facility];  Service: Orthopedics;  Laterality: Right;   DISTAL CLAVICLE EXCISION Right 2020   Right Shoulder Distal Clavicle Excision   JOINT REPLACEMENT  2008, 2013   Bilateral hip replacements   LAMINECTOMY  1984   L4-L5   LAMINECTOMY  1996   L3-L4   LUMBAR FUSION  01/11/2021   may 2022- L3-L5   LUMBAR FUSION  09/29/2021   L2-L3   SHOULDER ARTHROSCOPY WITH ROTATOR CUFF REPAIR Right 02/19/2024   Procedure: RIGHT SHOULDER ARTHROSCOPY;  Surgeon: Genelle Standing, MD;  Location: [facility];  Service: Orthopedics;  Laterality: Right;   SPINE SURGERY  2019, 2022, 2023   C4-C6 fusion, L2-L5 fusion   TOTAL HIP ARTHROPLASTY Left    left hip 09/2006   TOTAL HIP ARTHROPLASTY  09/14/2011   Right. Procedure: TOTAL HIP ARTHROPLASTY;  Surgeon: Toribio JULIANNA Chancy, MD;  Location: [facility];  Service: Orthopedics;  Laterality: Right;   Patient Active Problem List   Diagnosis Date Noted   Biceps rupture,  proximal, right, initial encounter 02/19/2024   Nontraumatic incomplete tear of right rotator cuff 02/19/2024   Fatty liver 05/20/2022   Essential hypertension 11/04/2021   Hyperlipidemia, unspecified 11/04/2021   Ascending aorta dilation (HCC) 11/04/2021   Spinal stenosis of lumbar region 09/29/2021   HNP (herniated nucleus pulposus), lumbar 01/11/2021   Acromioclavicular joint pain 07/13/2019   Sternum pain 01/16/2019   Right anterior shoulder pain 11/08/2017   Neck pain 07/04/2017   Gout 02/05/2017   Lumbar spondylosis 01/19/2017   Acute bilateral low back pain with bilateral sciatica 01/19/2017    PCP: [name]  REFERRING PROVIDER: Garnette Genelle   REFERRING DIAG: s/p R bicep tenodesis and collagen augmentation   THERAPY DIAG:  Acute pain of right shoulder  Muscle weakness (generalized)  Rationale for Evaluation and Treatment: Rehabilitation  ONSET DATE: 02/19/24  SUBJECTIVE:  SUBJECTIVE STATEMENT: 04/02/2024 Pt last seen 4 weeks ago, he was out of town. He is now 6 weeks post op. He states doing well, but still having bothersome soreness in front of R shoulder with activity. He also has soreness at night that is keeping him from sleeping and waking him up. He notes pain in L shoulder as well, mostly in the front/top of shoulder.   6/16 DOS: SUBPECTORAL BICEPS TENODESIS AND COLLAGEN PATCH AUGMENTATION;  Doing well since surgery, eating well, trying to sleep, has been propped up in bed with pillow under arm. Wearing sling today. He re-dressed his incisions yesterday to shower. Pt is retired, but very active. He has been able to resume walking in the neighborhood.   Hand dominance: Right  PERTINENT HISTORY: HTN, Thoracic aortic aneurysm, Bil THA, Lumbar: 2 previous laminectomy and 2  fusions L2-5. Cervical ACDF/ fusion C4-6;  R shoulder DCE 2020;   PAIN:  Are you having pain? Yes: NPRS scale: 3-5/10 Pain location: R shoulder  Pain description: sore Aggravating factors: none stated  Relieving factors: none stated   PRECAUTIONS: See protocol for surgical precautions   RED FLAGS: None   WEIGHT BEARING RESTRICTIONS: Yes, limited for R UE  FALLS:  Has patient fallen in last 6 months? No  PLOF: Independent  PATIENT GOALS: Decreased pain in R shoulder/arm, improved use and reaching.   NEXT MD VISIT:   OBJECTIVE:   DIAGNOSTIC FINDINGS:    PATIENT SURVEYS :    COGNITION: Overall cognitive status: Within functional limits for tasks assessed     SENSATION: WFL  POSTURE:   UPPER EXTREMITY ROM:   Active  ROM Right eval Left eval Right  04/02/24  Shoulder flexion Prom: up to 85 deg today, (bent elbow)  A: 140    Shoulder extension     Shoulder abduction   A: 140  Shoulder adduction     Shoulder internal rotation     Shoulder external rotation     Elbow flexion WFL  wfl  Elbow extension Not tested to full extension  wfl  Wrist flexion     Wrist extension     Wrist ulnar deviation     Wrist radial deviation     Wrist pronation     Wrist supination     (Blank rows = not tested)  UPPER EXTREMITY MMT:  not tested due to surgery   MMT Right eval Left eval  Shoulder flexion    Shoulder extension    Shoulder abduction    Shoulder adduction    Shoulder internal rotation    Shoulder external rotation    Middle trapezius    Lower trapezius    Elbow flexion    Elbow extension    Wrist flexion    Wrist extension    Wrist ulnar deviation    Wrist radial deviation    Wrist pronation    Wrist supination    Grip strength (lbs)    (Blank rows = not tested)  SHOULDER SPECIAL TESTS:   JOINT MOBILITY TESTING:  Mild hypomobility  PALPATION:  Dressing removed today, sterile gauze and tegaderm placed over stitches; Healing incisions around  R shoulder, stiches in place.     TODAY'S TREATMENT:  DATE:   04/02/2024  Therapeutic Exercise: Aerobic: Supine:  Shoulder flexion/Aarom  stick x 15, prom R shoulder, all motions.  S/L;  shoulder ER 1lb 2 x 10 bil;  Seated:  Standing:  scap squeeze x 15; Row GTB x 15;  shoulder flexion/arom/scaption x 10;  wall slides 2 x 10;  Stretches:  Neuromuscular Re-education: Manual Therapy: Therapeutic Activity: Self Care:    PATIENT EDUCATION:  Education details: updated  Person educated: Patient Education method: Explanation, Demonstration, Tactile cues, Verbal cues, and Handouts Education comprehension: verbalized understanding, returned demonstration, verbal cues required, tactile cues required, and needs further education   HOME EXERCISE PROGRAM: Access Code: [email or web address] URL: [email or web address] Date: 02/23/2024 Prepared by: Tinnie Don  Exercises - Supine Shoulder Flexion AAROM  - 2 x daily - 1 sets - 10-12 reps - Supported Elbow Flexion Extension PROM  - 2 x daily - 1 sets - 10-15 reps - Wrist Extension AROM  - 2 x daily - 1 sets - 10 reps  ASSESSMENT:  CLINICAL IMPRESSION: Pt with good tolerance for exercise today. Improved pain with elevation at end of session. Will benefit from continued strength for ER and scapular muscles, to improve ability for elevation without pain. Reviewed not over doing repeated flexion if still painful. Reviewed sleeping positions for arm support/pillow for improving comfort.   Eval:  Patient presents with deficits following R shoulder surgery on 6/16 for bicep tenodesis and collagen patch for rotator cuff tear. Pt with healing incisions, dressed today. Pt with good tolerance for initial movement and prom. Instructed on post op precautions , sling wear and rom limitations to follow at this time.  Pt with decreased ability for full functional activities, reaching, lifting, carrying, and IADLs. He has inability for functional use of ue at this tim. Pt to benefit from skilled PT to improve deficits and pain. He will return for 1 more visit, then is going out of state for 1 month.    OBJECTIVE IMPAIRMENTS: decreased activity tolerance, decreased knowledge of use of DME, decreased mobility, decreased ROM, decreased strength, increased muscle spasms, impaired flexibility, impaired UE functional use, and pain.   ACTIVITY LIMITATIONS: carrying, lifting, bathing, toileting, dressing, self feeding, reach over head, hygiene/grooming, and locomotion level  PARTICIPATION LIMITATIONS: meal prep, cleaning, laundry, driving, shopping, community activity, and yard work  PERSONAL FACTORS: 1 comorbidity: none are also affecting patient's functional outcome.   REHAB POTENTIAL: Good  CLINICAL DECISION MAKING: Stable/uncomplicated  EVALUATION COMPLEXITY: Low  GOALS: Goals reviewed with patient? Yes   SHORT TERM GOALS: Target date: 03/08/2024   Pt to be independent with initial HEP  Goal status: INITIAL  2.  Pt to understand post op guidelines for next 4 weeks while is out of state.   Goal status: INITIAL    LONG TERM GOALS: Target date: 05/17/2024  Pt to be independent with final HEP  Goal status: INITIAL  2.  Pt to demo improved AROM to be [facility] and pain free, to improve ability for ADLs.   Goal status: INITIAL  3.  Pt to demo improved strength to be at least 4/5, to improve ability for reach, lift, and IADLs.   Goal status: INITIAL  4.  Pt to repots decreased pain to 0-2/10 with activity and use of R UE.   Goal status: INITIAL    PLAN: PT FREQUENCY: 1-2x/week  PT DURATION: 12 weeks  PLANNED INTERVENTIONS: Therapeutic exercises, Therapeutic activity, Neuromuscular re-education, Patient/Family education, Self Care, Joint mobilization, Joint manipulation, Stair training, DME  instructions,  Aquatic Therapy, Dry Needling, Electrical stimulation, Cryotherapy, Moist heat, Taping, Ultrasound, Ionotophoresis 4mg /ml Dexamethasone , Manual therapy,  Vasopneumatic device, Traction, Spinal manipulation, Spinal mobilization,    PLAN FOR NEXT SESSION:    Tinnie Don, PT, DPT 10:05 AM  04/02/24

## 2024-04-04 ENCOUNTER — Encounter: Payer: Self-pay | Admitting: Physical Therapy

## 2024-04-04 ENCOUNTER — Ambulatory Visit (INDEPENDENT_AMBULATORY_CARE_PROVIDER_SITE_OTHER): Admitting: Physical Therapy

## 2024-04-04 DIAGNOSIS — M6281 Muscle weakness (generalized): Secondary | ICD-10-CM | POA: Diagnosis not present

## 2024-04-04 DIAGNOSIS — M25511 Pain in right shoulder: Secondary | ICD-10-CM

## 2024-04-04 NOTE — Therapy (Signed)
 OUTPATIENT PHYSICAL THERAPY UPPER EXTREMITY TREATMENT   Patient Name: Kyle Lawson MRN: [id number] DOB:04/15/1956, 68 y.o., male Today's Date: 04/04/2024  END OF SESSION:  PT End of Session - 04/04/24 1010     Visit Number 4    Number of Visits 24    Date for PT Re-Evaluation 05/17/24    Authorization Type Medicare    PT Start Time 1011    PT Stop Time 1054    PT Time Calculation (min) 43 min    Activity Tolerance Patient tolerated treatment well    Behavior During Therapy [facility] for tasks assessed/performed           Past Medical History:  Diagnosis Date   Arthritis    Gout    History of kidney stones    Hyperlipidemia    Hypertension    Thoracic aortic aneurysm (HCC) 2022   stable 4cm by CTA 12/2022   Past Surgical History:  Procedure Laterality Date   Anterior Cervical Decompression Fusion  2019   BICEPT TENODESIS Right 02/19/2024   Procedure: SUBPECTORAL BICEPS TENODESIS AND COLLAGEN PATCH AUGMENTATION;  Surgeon: Kyle Standing, MD;  Location: [facility];  Service: Orthopedics;  Laterality: Right;   DISTAL CLAVICLE EXCISION Right 2020   Right Shoulder Distal Clavicle Excision   JOINT REPLACEMENT  2008, 2013   Bilateral hip replacements   LAMINECTOMY  1984   L4-L5   LAMINECTOMY  1996   L3-L4   LUMBAR FUSION  01/11/2021   may 2022- L3-L5   LUMBAR FUSION  09/29/2021   L2-L3   SHOULDER ARTHROSCOPY WITH ROTATOR CUFF REPAIR Right 02/19/2024   Procedure: RIGHT SHOULDER ARTHROSCOPY;  Surgeon: Kyle Standing, MD;  Location: [facility];  Service: Orthopedics;  Laterality: Right;   SPINE SURGERY  2019, 2022, 2023   C4-C6 fusion, L2-L5 fusion   TOTAL HIP ARTHROPLASTY Left    left hip 09/2006   TOTAL HIP ARTHROPLASTY  09/14/2011   Right. Procedure: TOTAL HIP ARTHROPLASTY;  Surgeon: Kyle JULIANNA Chancy, MD;  Location: [facility];  Service: Orthopedics;  Laterality: Right;   Patient Active Problem List   Diagnosis Date Noted   Biceps rupture,  proximal, right, initial encounter 02/19/2024   Nontraumatic incomplete tear of right rotator cuff 02/19/2024   Fatty liver 05/20/2022   Essential hypertension 11/04/2021   Hyperlipidemia, unspecified 11/04/2021   Ascending aorta dilation (HCC) 11/04/2021   Spinal stenosis of lumbar region 09/29/2021   HNP (herniated nucleus pulposus), lumbar 01/11/2021   Acromioclavicular joint pain 07/13/2019   Sternum pain 01/16/2019   Right anterior shoulder pain 11/08/2017   Neck pain 07/04/2017   Gout 02/05/2017   Lumbar spondylosis 01/19/2017   Acute bilateral low back pain with bilateral sciatica 01/19/2017    PCP: Kyle Lawson  REFERRING PROVIDER: Garnette Kyle   REFERRING DIAG: s/p R bicep tenodesis and collagen augmentation   THERAPY DIAG:  Acute pain of right shoulder  Muscle weakness (generalized)  Rationale for Evaluation and Treatment: Rehabilitation  ONSET DATE: 02/19/24  SUBJECTIVE:  SUBJECTIVE STATEMENT:  04/04/2024 Pt states a bit less pain today. Did not ice shoulders last night, and did sleep better. Seeing surgeon tomorrow.   6/16 DOS: SUBPECTORAL BICEPS TENODESIS AND COLLAGEN PATCH AUGMENTATION;  Doing well since surgery, eating well, trying to sleep, has been propped up in bed with pillow under arm. Wearing sling today. He re-dressed his incisions yesterday to shower. Pt is retired, but very active. He has been able to resume walking in the neighborhood.   Hand dominance: Right  PERTINENT HISTORY: HTN, Thoracic aortic aneurysm, Bil THA, Lumbar: 2 previous laminectomy and 2 fusions L2-5. Cervical ACDF/ fusion C4-6;  R shoulder DCE 2020;   PAIN:  Are you having pain? Yes: NPRS scale: 3-5/10 Pain location: R shoulder  Pain description: sore Aggravating factors: none  stated  Relieving factors: none stated   PRECAUTIONS: See protocol for surgical precautions   RED FLAGS: None   WEIGHT BEARING RESTRICTIONS: Yes, limited for R UE  FALLS:  Has patient fallen in last 6 months? No  PLOF: Independent  PATIENT GOALS: Decreased pain in R shoulder/arm, improved use and reaching.   NEXT MD VISIT:   OBJECTIVE:   DIAGNOSTIC FINDINGS:    PATIENT SURVEYS :    COGNITION: Overall cognitive status: Within functional limits for tasks assessed     SENSATION: WFL  POSTURE:   UPPER EXTREMITY ROM:   Active  ROM Right eval Left eval Right  04/02/24  Shoulder flexion Prom: up to 85 deg today, (bent elbow)  A: 140    Shoulder extension     Shoulder abduction   A: 140  Shoulder adduction     Shoulder internal rotation     Shoulder external rotation     Elbow flexion WFL  wfl  Elbow extension Not tested to full extension  wfl  Wrist flexion     Wrist extension     Wrist ulnar deviation     Wrist radial deviation     Wrist pronation     Wrist supination     (Blank rows = not tested)  UPPER EXTREMITY MMT:  not tested due to surgery   MMT Right eval Left eval  Shoulder flexion    Shoulder extension    Shoulder abduction    Shoulder adduction    Shoulder internal rotation    Shoulder external rotation    Middle trapezius    Lower trapezius    Elbow flexion    Elbow extension    Wrist flexion    Wrist extension    Wrist ulnar deviation    Wrist radial deviation    Wrist pronation    Wrist supination    Grip strength (lbs)    (Blank rows = not tested)  SHOULDER SPECIAL TESTS:   JOINT MOBILITY TESTING:  Mild hypomobility  PALPATION:      TODAY'S TREATMENT:  DATE:   04/04/2024 Therapeutic Exercise: Aerobic: Supine:  Shoulder /Aarom  stick , flexion and ER  x 15 ea,  prom R shoulder, all  motions. Light manual pec stretch for R shoulder;  Quadruped:  Serratus press x 15;  S/L;  shoulder ER 2 lb 2 x 10 bil;  Seated:  Lawson:  scap squeeze x 15;  Row GTB x 15;  ER Ytb 2 x 10;  Ball circles at wall x 15 cw/ccw. ; bicep curls 4lb regular grip and hammer curl x 12 ea;   supination 3lb x 15;  Stretches:  Neuromuscular Re-education: Manual Therapy: Therapeutic Activity: Self Care:    Therapeutic Exercise: Aerobic: Supine:  Shoulder flexion/Aarom  stick x 15, prom R shoulder, all motions.  S/L;  shoulder ER 1lb 2 x 10 bil;  Seated:  Lawson:  scap squeeze x 15; Row GTB x 15;  shoulder flexion/arom/scaption x 10;  wall slides 2 x 10;  Stretches:  Neuromuscular Re-education: Manual Therapy: Therapeutic Activity: Self Care:    PATIENT EDUCATION:  Education details: updated and reviewed HEP Person educated: Patient Education method: Explanation, Demonstration, Tactile cues, Verbal cues, and Handouts Education comprehension: verbalized understanding, returned demonstration, verbal cues required, tactile cues required, and needs further education   HOME EXERCISE PROGRAM: Access Code: [email or web address] URL: [email or web address] Date: 02/23/2024 Prepared by: Tinnie Don  Exercises - Supine Shoulder Flexion AAROM  - 2 x daily - 1 sets - 10-12 reps - Supported Elbow Flexion Extension PROM  - 2 x daily - 1 sets - 10-15 reps - Wrist Extension AROM  - 2 x daily - 1 sets - 10 reps  ASSESSMENT:  CLINICAL IMPRESSION: Pt with good tolerance for exercise today.  Will benefit from continued Rom and strength for ER and scapular muscles, to improve ability for elevation without pain. Reviewed not over doing repeated flexion if still painful. Plan to progress as tolerated, seeing MD tomorrow for f/u.   Eval:  Patient presents with deficits following R shoulder surgery on 6/16 for bicep tenodesis and collagen patch for rotator cuff tear. Pt with healing incisions,  dressed today. Pt with good tolerance for initial movement and prom. Instructed on post op precautions , sling wear and rom limitations to follow at this time. Pt with decreased ability for full functional activities, reaching, lifting, carrying, and IADLs. He has inability for functional use of ue at this tim. Pt to benefit from skilled PT to improve deficits and pain. He will return for 1 more visit, then is going out of state for 1 month.    OBJECTIVE IMPAIRMENTS: decreased activity tolerance, decreased knowledge of use of DME, decreased mobility, decreased ROM, decreased strength, increased muscle spasms, impaired flexibility, impaired UE functional use, and pain.   ACTIVITY LIMITATIONS: carrying, lifting, bathing, toileting, dressing, self feeding, reach over head, hygiene/grooming, and locomotion level  PARTICIPATION LIMITATIONS: meal prep, cleaning, laundry, driving, shopping, community activity, and yard work  PERSONAL FACTORS: 1 comorbidity: none are also affecting patient's functional outcome.   REHAB POTENTIAL: Good  CLINICAL DECISION MAKING: Stable/uncomplicated  EVALUATION COMPLEXITY: Low  GOALS: Goals reviewed with patient? Yes   SHORT TERM GOALS: Target date: 03/08/2024   Pt to be independent with initial HEP  Goal status: MET  2.  Pt to understand post op guidelines for next 4 weeks while is out of state.   Goal status: MET    LONG TERM GOALS: Target date: 05/17/2024  Pt to be independent with final HEP  Goal status: INITIAL  2.  Pt to demo improved AROM to be [facility] and pain free, to improve ability for ADLs.   Goal status: INITIAL  3.  Pt to demo improved strength to be at least 4/5, to improve ability for reach, lift, and IADLs.   Goal status: INITIAL  4.  Pt to repots decreased pain to 0-2/10 with activity and use of R UE.   Goal status: INITIAL    PLAN: PT FREQUENCY: 1-2x/week  PT DURATION: 12 weeks  PLANNED INTERVENTIONS: Therapeutic  exercises, Therapeutic activity, Neuromuscular re-education, Patient/Family education, Self Care, Joint mobilization, Joint manipulation, Stair training, DME instructions, Aquatic Therapy, Dry Needling, Electrical stimulation, Cryotherapy, Moist heat, Taping, Ultrasound, Ionotophoresis 4mg /ml Dexamethasone , Manual therapy,  Vasopneumatic device, Traction, Spinal manipulation, Spinal mobilization,    PLAN FOR NEXT SESSION:    Tinnie Don, PT, DPT 11:05 AM  04/04/24

## 2024-04-05 ENCOUNTER — Ambulatory Visit (HOSPITAL_BASED_OUTPATIENT_CLINIC_OR_DEPARTMENT_OTHER): Admitting: Orthopaedic Surgery

## 2024-04-05 ENCOUNTER — Ambulatory Visit (INDEPENDENT_AMBULATORY_CARE_PROVIDER_SITE_OTHER)

## 2024-04-05 DIAGNOSIS — M25512 Pain in left shoulder: Secondary | ICD-10-CM

## 2024-04-05 DIAGNOSIS — G8929 Other chronic pain: Secondary | ICD-10-CM

## 2024-04-05 DIAGNOSIS — M7582 Other shoulder lesions, left shoulder: Secondary | ICD-10-CM

## 2024-04-05 NOTE — Progress Notes (Signed)
 Post Operative Evaluation    Procedure/Date of Surgery: Right shoulder arthroscopy with collagen patch augmentation and biceps tenodesis subpectoral 6/16   Interval History:    Presents today 6 weeks status post the above procedure.  At this time he is continue to improve.  Overhead range of motion is coming along nicely.   PMH/PSH/Family History/Social History/Meds/Allergies:    Past Medical History:  Diagnosis Date   Arthritis    Gout    History of kidney stones    Hyperlipidemia    Hypertension    Thoracic aortic aneurysm (HCC) 2022   stable 4cm by CTA 12/2022   Past Surgical History:  Procedure Laterality Date   Anterior Cervical Decompression Fusion  2019   BICEPT TENODESIS Right 02/19/2024   Procedure: SUBPECTORAL BICEPS TENODESIS AND COLLAGEN PATCH AUGMENTATION;  Surgeon: Genelle Standing, MD;  Location: [facility];  Service: Orthopedics;  Laterality: Right;   DISTAL CLAVICLE EXCISION Right 2020   Right Shoulder Distal Clavicle Excision   JOINT REPLACEMENT  2008, 2013   Bilateral hip replacements   LAMINECTOMY  1984   L4-L5   LAMINECTOMY  1996   L3-L4   LUMBAR FUSION  01/11/2021   may 2022- L3-L5   LUMBAR FUSION  09/29/2021   L2-L3   SHOULDER ARTHROSCOPY WITH ROTATOR CUFF REPAIR Right 02/19/2024   Procedure: RIGHT SHOULDER ARTHROSCOPY;  Surgeon: Genelle Standing, MD;  Location: [facility];  Service: Orthopedics;  Laterality: Right;   SPINE SURGERY  2019, 2022, 2023   C4-C6 fusion, L2-L5 fusion   TOTAL HIP ARTHROPLASTY Left    left hip 09/2006   TOTAL HIP ARTHROPLASTY  09/14/2011   Right. Procedure: TOTAL HIP ARTHROPLASTY;  Surgeon: Toribio JULIANNA Chancy, MD;  [facility];  Service: Orthopedics;  Laterality: Right;   Social History   Socioeconomic History   Marital status: Married    Spouse name: Not on file   Number of children: 2   Years of education: Not on file   Highest education level: Not  on file  Occupational History   Not on file  Tobacco Use   Smoking status: Never   Smokeless tobacco: Never  Vaping Use   Vaping status: Never Used  Substance and Sexual Activity   Alcohol use: Yes    Alcohol/week: 4.0 - 5.0 standard drinks of alcohol    Types: 4 - 5 Cans of beer per week    Comment: social   Drug use: Yes    Types: Marijuana    Comment: for pain- very occasionally   Sexual activity: Yes    Partners: Female  Other Topics Concern   Not on file  Social History Narrative   Married 28 years in 2023. Daughter 30, son 4 in 2023.    From [place]       Retired from [employer]- enjoying retirement      Hobbies: traveling, hiking, walks daily, live music   Social Drivers of Health   Financial Resource Strain: Low Risk  (07/26/2023)   Overall Financial Resource Strain (CARDIA)    Difficulty of Paying Living Expenses: Not hard at all  Food Insecurity: No Food Insecurity (07/26/2023)   Hunger Vital Sign    Worried About Running Out of Food in the Last Year: Never true    Ran Out of Food in  the Last Year: Never true  Transportation Needs: No Transportation Needs (07/26/2023)   PRAPARE - Administrator, Civil Service (Medical): No    Lack of Transportation (Non-Medical): No  Physical Activity: Sufficiently Active (07/26/2023)   Exercise Vital Sign    Days of Exercise per Week: 5 days    Minutes of Exercise per Session: 90 min  Stress: No Stress Concern Present (07/26/2023)   [employer] of Occupational Health - Occupational Stress Questionnaire    Feeling of Stress : Not at all  Social Connections: Moderately Isolated (07/26/2023)   Social Connection and Isolation Panel    Frequency of Communication with Friends and Family: More than three times a week    Frequency of Social Gatherings with Friends and Family: More than three times a week    Attends Religious Services: Never    Database administrator or Organizations: No    Attends Museum/gallery exhibitions officer: Never    Marital Status: Married   Family History  Problem Relation Age of Onset   Diabetes Mother        passed at 62   Skin cancer Father    Gout Father    Hypertension Father        passed at 28   Hyperlipidemia Father    CAD Brother        CABG 62   Cancer Brother    Diabetes Brother    Healthy Brother    Healthy Brother    Alcoholism Brother    Depression Brother        suicide age 30   Healthy Maternal Grandmother        lived to 44   Brain cancer Maternal Grandfather        age 58- was an MD   CAD Paternal Grandmother        died in 47s   CAD Paternal Grandfather        died in 5s   Obesity Sister    Arthritis Sister    Healthy Sister    No Known Allergies Current Outpatient Medications  Medication Sig Dispense Refill   acetaminophen  (TYLENOL ) 500 MG tablet Take 500 mg by mouth every 6 (six) hours as needed (pain.).     allopurinol  (ZYLOPRIM ) 300 MG tablet TAKE 1 TABLET EVERY MORNING 90 tablet 3   aspirin  EC 325 MG tablet Take 1 tablet (325 mg total) by mouth daily. 14 tablet 0   Cholecalciferol (VITAMIN D -3 PO) Take 2,000 Units by mouth in the morning.     colchicine  0.6 MG tablet Take 1 tablet (0.6 mg total) by mouth daily. At start of flare- take 2 and then an hour later (max 3 day one) if not improved can take 1 more, then daily until gout flare resolves (Patient taking differently: Take 0.6 mg by mouth daily as needed (gout attacks).) 30 tablet 3   cyclobenzaprine  (FLEXERIL ) 10 MG tablet Take 1 tablet (10 mg total) by mouth 3 (three) times daily as needed for muscle spasms. 30 tablet 0   diclofenac  Sodium (PENNSAID ) 2 % SOLN Apply 1-2 Pump topically 2 (two) times daily as needed (pain.).     losartan  (COZAAR ) 100 MG tablet Take 1 tablet (100 mg total) by mouth daily. 90 tablet 3   meloxicam  (MOBIC ) 15 MG tablet Take 1 tablet (15 mg total) by mouth daily. 30 tablet 0   Multiple Vitamin (MULTIVITAMIN WITH MINERALS) TABS tablet Take 1  tablet by mouth in  the morning.     oxyCODONE  (ROXICODONE ) 5 MG immediate release tablet Take 1 tablet (5 mg total) by mouth every 4 (four) hours as needed for severe pain (pain score 7-10) or breakthrough pain. 10 tablet 0   simvastatin  (ZOCOR ) 40 MG tablet TAKE 1 TABLET DAILY 90 tablet 3   No current facility-administered medications for this visit.   No results found.  Review of Systems:   A ROS was performed including pertinent positives and negatives as documented in the HPI.   Musculoskeletal Exam:    There were no vitals taken for this visit.  Right shoulder incisions are well-appearing without erythema or drainage.  Active forward elevation is 165 degrees.  External rotation at the side is to 45 internal rotation is to L1.  Left shoulder with tenderness about the anterior lateral aspect of the shoulder with forward elevation to 165 degrees external rotation is to 45 and internal rotation is to L1.  Pain with Neer impingement Imaging:      I personally reviewed and interpreted the radiographs.   Assessment:   6 weeks status post right shoulder arthroscopy and subpectoral biceps tenodesis overall doing extremely well.  At this time we will continue to work on strengthening the side.  The left side he does appear to have rotator cuff tendinitis and impingement at as well as biceps tendinitis.  Given this I have recommended ultrasound-guided injection of the subacromial space which she has agreed to today.  I will plan to see her back in 6 weeks.  We will possibly consider an MRI of the left shoulder in the future should this injection not give him complete relief  Plan :    - Return to clinic 6 weeks for reassessment      I personally saw and evaluated the patient, and participated in the management and treatment plan.  Elspeth Parker, MD Attending Physician, Orthopedic Surgery  This document was dictated using Dragon voice recognition software. A reasonable attempt at  proof reading has been made to minimize errors.

## 2024-04-11 ENCOUNTER — Ambulatory Visit (INDEPENDENT_AMBULATORY_CARE_PROVIDER_SITE_OTHER): Admitting: Physical Therapy

## 2024-04-11 ENCOUNTER — Encounter: Payer: Self-pay | Admitting: Physical Therapy

## 2024-04-11 DIAGNOSIS — M25511 Pain in right shoulder: Secondary | ICD-10-CM

## 2024-04-11 DIAGNOSIS — M6281 Muscle weakness (generalized): Secondary | ICD-10-CM

## 2024-04-11 NOTE — Therapy (Signed)
 OUTPATIENT PHYSICAL THERAPY UPPER EXTREMITY TREATMENT   Patient Name: Kyle Lawson MRN: [id number] DOB:Jan 04, 1956, 68 y.o., male Today's Date: 04/11/2024  END OF SESSION:  PT End of Session - 04/11/24 1442     Visit Number 5    Number of Visits 24    Date for PT Re-Evaluation 05/17/24    Authorization Type Medicare    PT Start Time 1440    PT Stop Time 1520    PT Time Calculation (min) 40 min    Activity Tolerance Patient tolerated treatment well    Behavior During Therapy [facility] for tasks assessed/performed            Past Medical History:  Diagnosis Date   Arthritis    Gout    History of kidney stones    Hyperlipidemia    Hypertension    Thoracic aortic aneurysm (HCC) 2022   stable 4cm by CTA 12/2022   Past Surgical History:  Procedure Laterality Date   Anterior Cervical Decompression Fusion  2019   BICEPT TENODESIS Right 02/19/2024   Procedure: SUBPECTORAL BICEPS TENODESIS AND COLLAGEN PATCH AUGMENTATION;  Surgeon: Genelle Standing, MD;  Location: [facility];  Service: Orthopedics;  Laterality: Right;   DISTAL CLAVICLE EXCISION Right 2020   Right Shoulder Distal Clavicle Excision   JOINT REPLACEMENT  2008, 2013   Bilateral hip replacements   LAMINECTOMY  1984   L4-L5   LAMINECTOMY  1996   L3-L4   LUMBAR FUSION  01/11/2021   may 2022- L3-L5   LUMBAR FUSION  09/29/2021   L2-L3   SHOULDER ARTHROSCOPY WITH ROTATOR CUFF REPAIR Right 02/19/2024   Procedure: RIGHT SHOULDER ARTHROSCOPY;  Surgeon: Genelle Standing, MD;  Location: [facility];  Service: Orthopedics;  Laterality: Right;   SPINE SURGERY  2019, 2022, 2023   C4-C6 fusion, L2-L5 fusion   TOTAL HIP ARTHROPLASTY Left    left hip 09/2006   TOTAL HIP ARTHROPLASTY  09/14/2011   Right. Procedure: TOTAL HIP ARTHROPLASTY;  Surgeon: Toribio JULIANNA Chancy, MD;  Location: [facility];  Service: Orthopedics;  Laterality: Right;   Patient Active Problem List   Diagnosis Date Noted   Biceps rupture,  proximal, right, initial encounter 02/19/2024   Nontraumatic incomplete tear of right rotator cuff 02/19/2024   Fatty liver 05/20/2022   Essential hypertension 11/04/2021   Hyperlipidemia, unspecified 11/04/2021   Ascending aorta dilation (HCC) 11/04/2021   Spinal stenosis of lumbar region 09/29/2021   HNP (herniated nucleus pulposus), lumbar 01/11/2021   Acromioclavicular joint pain 07/13/2019   Sternum pain 01/16/2019   Right anterior shoulder pain 11/08/2017   Neck pain 07/04/2017   Gout 02/05/2017   Lumbar spondylosis 01/19/2017   Acute bilateral low back pain with bilateral sciatica 01/19/2017    PCP: [name]  REFERRING PROVIDER: Garnette Genelle   REFERRING DIAG: s/p R bicep tenodesis and collagen augmentation   THERAPY DIAG:  Acute pain of right shoulder  Muscle weakness (generalized)  Rationale for Evaluation and Treatment: Rehabilitation  ONSET DATE: 02/19/24  SUBJECTIVE:  SUBJECTIVE STATEMENT:  04/11/2024 Saw surgeon and things are on track. Got an injection on L shoulder so now has been able to sleep better. Reports Dr. Genelle didn't say much about his continued bicep muscle bundle.   6/16 DOS: SUBPECTORAL BICEPS TENODESIS AND COLLAGEN PATCH AUGMENTATION;  Doing well since surgery, eating well, trying to sleep, has been propped up in bed with pillow under arm. Wearing sling today. He re-dressed his incisions yesterday to shower. Pt is retired, but very active. He has been able to resume walking in the neighborhood.   Hand dominance: Right  PERTINENT HISTORY: HTN, Thoracic aortic aneurysm, Bil THA, Lumbar: 2 previous laminectomy and 2 fusions L2-5. Cervical ACDF/ fusion C4-6;  R shoulder DCE 2020;   PAIN:  Are you having pain? Yes: NPRS scale: 1-2/10 Pain location: R  shoulder  Pain description: sore Aggravating factors: none stated  Relieving factors: none stated   PRECAUTIONS: See protocol for surgical precautions   RED FLAGS: None   WEIGHT BEARING RESTRICTIONS: Yes, limited for R UE  FALLS:  Has patient fallen in last 6 months? No  PLOF: Independent  PATIENT GOALS: Decreased pain in R shoulder/arm, improved use and reaching.   NEXT MD VISIT:   OBJECTIVE:   DIAGNOSTIC FINDINGS:    PATIENT SURVEYS :    COGNITION: Overall cognitive status: Within functional limits for tasks assessed     SENSATION: WFL  POSTURE:   UPPER EXTREMITY ROM:   Active  ROM Right eval Left eval Right  04/02/24  Shoulder flexion Prom: up to 85 deg today, (bent elbow)  A: 140    Shoulder extension     Shoulder abduction   A: 140  Shoulder adduction     Shoulder internal rotation     Shoulder external rotation     Elbow flexion WFL  wfl  Elbow extension Not tested to full extension  wfl  Wrist flexion     Wrist extension     Wrist ulnar deviation     Wrist radial deviation     Wrist pronation     Wrist supination     (Blank rows = not tested)  UPPER EXTREMITY MMT:  not tested due to surgery   MMT Right eval Left eval  Shoulder flexion    Shoulder extension    Shoulder abduction    Shoulder adduction    Shoulder internal rotation    Shoulder external rotation    Middle trapezius    Lower trapezius    Elbow flexion    Elbow extension    Wrist flexion    Wrist extension    Wrist ulnar deviation    Wrist radial deviation    Wrist pronation    Wrist supination    Grip strength (lbs)    (Blank rows = not tested)  SHOULDER SPECIAL TESTS:   JOINT MOBILITY TESTING:  Mild hypomobility  PALPATION:      TODAY'S TREATMENT:  DATE:   04/11/2024 Therapeutic Exercise: Aerobic: Supine:  Seated:  Shoulder pulleys flexion, horizontal shoulder abd, and abd x 2 min each Standing:  Row GTB 2x10; shoulder ext GTB 2x10; shoulder external rotation GTB 2x10; shoulder internal rotation GTB 2x10; gentle bicep stretch 3x30; tricep GTB 2x10; bicep curl GTB 2x10; hammer curl GTB 2x10 Stretches:  Neuromuscular Re-education: Manual Therapy: Therapeutic Activity: Self Care:   PATIENT EDUCATION:  Education details: updated and reviewed HEP Person educated: Patient Education method: Explanation, Demonstration, Tactile cues, Verbal cues, and Handouts Education comprehension: verbalized understanding, returned demonstration, verbal cues required, tactile cues required, and needs further education   HOME EXERCISE PROGRAM: Access Code: [email or web address] URL: [email or web address] Date: 02/23/2024 Prepared by: Tinnie Don  Exercises - Supine Shoulder Flexion AAROM  - 2 x daily - 1 sets - 10-12 reps - Supported Elbow Flexion Extension PROM  - 2 x daily - 1 sets - 10-15 reps - Wrist Extension AROM  - 2 x daily - 1 sets - 10 reps  ASSESSMENT:  CLINICAL IMPRESSION: Pain continues to decrease. Focused on progressive strengthening for posterior shoulder, triceps and biceps. 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   Eval:  Patient presents with deficits following R shoulder surgery on 6/16 for bicep tenodesis and collagen patch for rotator cuff tear. Pt with healing incisions, dressed today. Pt with good tolerance for initial movement and prom. Instructed on post op precautions , sling wear and rom limitations to follow at this time. Pt with decreased ability for full functional activities, reaching, lifting, carrying, and IADLs. He has inability for functional use of ue at this tim. Pt to benefit from skilled PT to improve deficits and pain. He will return for 1 more visit, then is going out of state for 1 month.    OBJECTIVE IMPAIRMENTS: decreased activity tolerance, decreased  knowledge of use of DME, decreased mobility, decreased ROM, decreased strength, increased muscle spasms, impaired flexibility, impaired UE functional use, and pain.   ACTIVITY LIMITATIONS: carrying, lifting, bathing, toileting, dressing, self feeding, reach over head, hygiene/grooming, and locomotion level  PARTICIPATION LIMITATIONS: meal prep, cleaning, laundry, driving, shopping, community activity, and yard work  PERSONAL FACTORS: 1 comorbidity: none are also affecting patient's functional outcome.   REHAB POTENTIAL: Good  CLINICAL DECISION MAKING: Stable/uncomplicated  EVALUATION COMPLEXITY: Low  GOALS: Goals reviewed with patient? Yes   SHORT TERM GOALS: Target date: 03/08/2024   Pt to be independent with initial HEP  Goal status: MET  2.  Pt to understand post op guidelines for next 4 weeks while is out of state.   Goal status: MET    LONG TERM GOALS: Target date: 05/17/2024  Pt to be independent with final HEP  Goal status: INITIAL  2.  Pt to demo improved AROM to be [facility] and pain free, to improve ability for ADLs.   Goal status: INITIAL  3.  Pt to demo improved strength to be at least 4/5, to improve ability for reach, lift, and IADLs.   Goal status: INITIAL  4.  Pt to repots decreased pain to 0-2/10 with activity and use of R UE.   Goal status: INITIAL    PLAN: PT FREQUENCY: 1-2x/week  PT DURATION: 12 weeks  PLANNED INTERVENTIONS: Therapeutic exercises, Therapeutic activity, Neuromuscular re-education, Patient/Family education, Self Care, Joint mobilization, Joint manipulation, Stair training, DME instructions, Aquatic Therapy, Dry Needling, Electrical stimulation, Cryotherapy, Moist heat, Taping, Ultrasound, Ionotophoresis 4mg /ml Dexamethasone , Manual therapy,  Vasopneumatic device, Traction, Spinal manipulation, Spinal  mobilization,    PLAN FOR NEXT SESSION: Continue scapular, bicep and tricep strengthening   Zyire Eidson April Ma L Claron Rosencrans, PT DPT 2:42  PM  04/11/24

## 2024-04-16 ENCOUNTER — Encounter: Payer: Self-pay | Admitting: Physical Therapy

## 2024-04-16 ENCOUNTER — Ambulatory Visit (INDEPENDENT_AMBULATORY_CARE_PROVIDER_SITE_OTHER): Admitting: Physical Therapy

## 2024-04-16 DIAGNOSIS — M6281 Muscle weakness (generalized): Secondary | ICD-10-CM | POA: Diagnosis not present

## 2024-04-16 DIAGNOSIS — M25511 Pain in right shoulder: Secondary | ICD-10-CM

## 2024-04-16 NOTE — Therapy (Signed)
 OUTPATIENT PHYSICAL THERAPY UPPER EXTREMITY TREATMENT   Patient Name: Kyle Lawson MRN: [id number] DOB:01/13/1956, 68 y.o., male Today's Date: 04/16/2024  END OF SESSION:  PT End of Session - 04/16/24 0935     Visit Number 6    Number of Visits 24    Date for PT Re-Evaluation 05/17/24    Authorization Type Medicare    PT Start Time 0932    PT Stop Time 1015    PT Time Calculation (min) 43 min    Activity Tolerance Patient tolerated treatment well    Behavior During Therapy [facility] for tasks assessed/performed             Past Medical History:  Diagnosis Date   Arthritis    Gout    History of kidney stones    Hyperlipidemia    Hypertension    Thoracic aortic aneurysm (HCC) 2022   stable 4cm by CTA 12/2022   Past Surgical History:  Procedure Laterality Date   Anterior Cervical Decompression Fusion  2019   BICEPT TENODESIS Right 02/19/2024   Procedure: SUBPECTORAL BICEPS TENODESIS AND COLLAGEN PATCH AUGMENTATION;  Surgeon: Genelle Standing, MD;  Location: [facility];  Service: Orthopedics;  Laterality: Right;   DISTAL CLAVICLE EXCISION Right 2020   Right Shoulder Distal Clavicle Excision   JOINT REPLACEMENT  2008, 2013   Bilateral hip replacements   LAMINECTOMY  1984   L4-L5   LAMINECTOMY  1996   L3-L4   LUMBAR FUSION  01/11/2021   may 2022- L3-L5   LUMBAR FUSION  09/29/2021   L2-L3   SHOULDER ARTHROSCOPY WITH ROTATOR CUFF REPAIR Right 02/19/2024   Procedure: RIGHT SHOULDER ARTHROSCOPY;  Surgeon: Genelle Standing, MD;  Location: [facility];  Service: Orthopedics;  Laterality: Right;   SPINE SURGERY  2019, 2022, 2023   C4-C6 fusion, L2-L5 fusion   TOTAL HIP ARTHROPLASTY Left    left hip 09/2006   TOTAL HIP ARTHROPLASTY  09/14/2011   Right. Procedure: TOTAL HIP ARTHROPLASTY;  Surgeon: Toribio JULIANNA Chancy, MD;  Location: [facility];  Service: Orthopedics;  Laterality: Right;   Patient Active Problem List   Diagnosis Date Noted   Biceps rupture,  proximal, right, initial encounter 02/19/2024   Nontraumatic incomplete tear of right rotator cuff 02/19/2024   Fatty liver 05/20/2022   Essential hypertension 11/04/2021   Hyperlipidemia, unspecified 11/04/2021   Ascending aorta dilation (HCC) 11/04/2021   Spinal stenosis of lumbar region 09/29/2021   HNP (herniated nucleus pulposus), lumbar 01/11/2021   Acromioclavicular joint pain 07/13/2019   Sternum pain 01/16/2019   Right anterior shoulder pain 11/08/2017   Neck pain 07/04/2017   Gout 02/05/2017   Lumbar spondylosis 01/19/2017   Acute bilateral low back pain with bilateral sciatica 01/19/2017    PCP: [name]  REFERRING PROVIDER: Garnette Genelle   REFERRING DIAG: s/p R bicep tenodesis and collagen augmentation   THERAPY DIAG:  Acute pain of right shoulder  Muscle weakness (generalized)  Rationale for Evaluation and Treatment: Rehabilitation  ONSET DATE: 02/19/24  SUBJECTIVE:  SUBJECTIVE STATEMENT:  04/16/2024 Pt states doing well, improving in last 2 weeks.   6/16 DOS: SUBPECTORAL BICEPS TENODESIS AND COLLAGEN PATCH AUGMENTATION;  Doing well since surgery, eating well, trying to sleep, has been propped up in bed with pillow under arm. Wearing sling today. He re-dressed his incisions yesterday to shower. Pt is retired, but very active. He has been able to resume walking in the neighborhood.   Hand dominance: Right  PERTINENT HISTORY: HTN, Thoracic aortic aneurysm, Bil THA, Lumbar: 2 previous laminectomy and 2 fusions L2-5. Cervical ACDF/ fusion C4-6;  R shoulder DCE 2020;   PAIN:  Are you having pain? Yes: NPRS scale: 1-2/10 Pain location: R shoulder  Pain description: sore Aggravating factors: none stated  Relieving factors: none stated   PRECAUTIONS: See protocol for surgical  precautions   RED FLAGS: None   WEIGHT BEARING RESTRICTIONS: Yes, limited for R UE  FALLS:  Has patient fallen in last 6 months? No  PLOF: Independent  PATIENT GOALS: Decreased pain in R shoulder/arm, improved use and reaching.   NEXT MD VISIT:   OBJECTIVE:   DIAGNOSTIC FINDINGS:    PATIENT SURVEYS :    COGNITION: Overall cognitive status: Within functional limits for tasks assessed     SENSATION: WFL  POSTURE:   UPPER EXTREMITY ROM:   Active  ROM Right eval Left eval Right  04/02/24  Shoulder flexion Prom: up to 85 deg today, (bent elbow)  A: 140    Shoulder extension     Shoulder abduction   A: 140  Shoulder adduction     Shoulder internal rotation     Shoulder external rotation     Elbow flexion WFL  wfl  Elbow extension Not tested to full extension  wfl  Wrist flexion     Wrist extension     Wrist ulnar deviation     Wrist radial deviation     Wrist pronation     Wrist supination     (Blank rows = not tested)  UPPER EXTREMITY MMT:  not tested due to surgery   MMT Right eval Left eval  Shoulder flexion    Shoulder extension    Shoulder abduction    Shoulder adduction    Shoulder internal rotation    Shoulder external rotation    Middle trapezius    Lower trapezius    Elbow flexion    Elbow extension    Wrist flexion    Wrist extension    Wrist ulnar deviation    Wrist radial deviation    Wrist pronation    Wrist supination    Grip strength (lbs)    (Blank rows = not tested)  SHOULDER SPECIAL TESTS:   JOINT MOBILITY TESTING:  Mild hypomobility  PALPATION:      TODAY'S TREATMENT:  DATE:   04/16/2024 Therapeutic Exercise: Aerobic: Supine:  shoulder flexion/arom x 15;  Seated: Shoulder pulleys flexion, abd x 2 min each Standing:  wall slides/flexion x 10 bil;  Row BlueTB 2x10;  shoulder  ext GTB 2x10; shoulder external rotation RTB 2x10(attention to back posture) ;  tricep GTB 2x10;   bicep curl 6 lb  2x10;  hammer curl GTB 2x10 Shoulder flexion/arom x 10 Stretches:  Neuromuscular Re-education: Manual Therapy: Therapeutic Activity: Self Care:   PATIENT EDUCATION:  Education details: updated and reviewed HEP Person educated: Patient Education method: Explanation, Demonstration, Tactile cues, Verbal cues, and Handouts Education comprehension: verbalized understanding, returned demonstration, verbal cues required, tactile cues required, and needs further education   HOME EXERCISE PROGRAM: Access Code: [email or web address] URL: [email or web address] Date: 02/23/2024 Prepared by: Tinnie Don  Exercises - Supine Shoulder Flexion AAROM  - 2 x daily - 1 sets - 10-12 reps - Supported Elbow Flexion Extension PROM  - 2 x daily - 1 sets - 10-15 reps - Wrist Extension AROM  - 2 x daily - 1 sets - 10 reps  ASSESSMENT:  CLINICAL IMPRESSION: Pt progressing well. He has improving pain and function. He has improving ROM for flexion, near normal, but still lacking full end range motion. Will benefit from continued work on motion and progressive strengthening.   Eval:  Patient presents with deficits following R shoulder surgery on 6/16 for bicep tenodesis and collagen patch for rotator cuff tear. Pt with healing incisions, dressed today. Pt with good tolerance for initial movement and prom. Instructed on post op precautions , sling wear and rom limitations to follow at this time. Pt with decreased ability for full functional activities, reaching, lifting, carrying, and IADLs. He has inability for functional use of ue at this tim. Pt to benefit from skilled PT to improve deficits and pain. He will return for 1 more visit, then is going out of state for 1 month.    OBJECTIVE IMPAIRMENTS: decreased activity tolerance, decreased knowledge of use of DME, decreased mobility,  decreased ROM, decreased strength, increased muscle spasms, impaired flexibility, impaired UE functional use, and pain.   ACTIVITY LIMITATIONS: carrying, lifting, bathing, toileting, dressing, self feeding, reach over head, hygiene/grooming, and locomotion level  PARTICIPATION LIMITATIONS: meal prep, cleaning, laundry, driving, shopping, community activity, and yard work  PERSONAL FACTORS: 1 comorbidity: none are also affecting patient's functional outcome.   REHAB POTENTIAL: Good  CLINICAL DECISION MAKING: Stable/uncomplicated  EVALUATION COMPLEXITY: Low  GOALS: Goals reviewed with patient? Yes   SHORT TERM GOALS: Target date: 03/08/2024   Pt to be independent with initial HEP  Goal status: MET  2.  Pt to understand post op guidelines for next 4 weeks while is out of state.   Goal status: MET    LONG TERM GOALS: Target date: 05/17/2024  Pt to be independent with final HEP  Goal status: INITIAL  2.  Pt to demo improved AROM to be [facility] and pain free, to improve ability for ADLs.   Goal status: INITIAL  3.  Pt to demo improved strength to be at least 4/5, to improve ability for reach, lift, and IADLs.   Goal status: INITIAL  4.  Pt to repots decreased pain to 0-2/10 with activity and use of R UE.   Goal status: INITIAL    PLAN: PT FREQUENCY: 1-2x/week  PT DURATION: 12 weeks  PLANNED INTERVENTIONS: Therapeutic exercises, Therapeutic activity, Neuromuscular re-education, Patient/Family education, Self Care, Joint mobilization, Joint manipulation, Stair training, DME instructions, Aquatic Therapy,  Dry Needling, Electrical stimulation, Cryotherapy, Moist heat, Taping, Ultrasound, Ionotophoresis 4mg /ml Dexamethasone , Manual therapy,  Vasopneumatic device, Traction, Spinal manipulation, Spinal mobilization,    PLAN FOR NEXT SESSION: Continue scapular, bicep and tricep strengthening   Tinnie Don, PT DPT 9:36 AM  04/16/24

## 2024-04-18 ENCOUNTER — Encounter: Payer: Self-pay | Admitting: Physical Therapy

## 2024-04-18 ENCOUNTER — Ambulatory Visit (INDEPENDENT_AMBULATORY_CARE_PROVIDER_SITE_OTHER): Admitting: Physical Therapy

## 2024-04-18 ENCOUNTER — Telehealth: Payer: Self-pay | Admitting: Family Medicine

## 2024-04-18 ENCOUNTER — Other Ambulatory Visit: Payer: Self-pay | Admitting: Family Medicine

## 2024-04-18 DIAGNOSIS — I10 Essential (primary) hypertension: Secondary | ICD-10-CM

## 2024-04-18 DIAGNOSIS — Z131 Encounter for screening for diabetes mellitus: Secondary | ICD-10-CM

## 2024-04-18 DIAGNOSIS — Z125 Encounter for screening for malignant neoplasm of prostate: Secondary | ICD-10-CM

## 2024-04-18 DIAGNOSIS — E785 Hyperlipidemia, unspecified: Secondary | ICD-10-CM

## 2024-04-18 DIAGNOSIS — M6281 Muscle weakness (generalized): Secondary | ICD-10-CM | POA: Diagnosis not present

## 2024-04-18 DIAGNOSIS — M25511 Pain in right shoulder: Secondary | ICD-10-CM

## 2024-04-18 NOTE — Telephone Encounter (Signed)
 Will place orders and let patient know they can be done 2-3 days prior to cpe.

## 2024-04-18 NOTE — Telephone Encounter (Signed)
Pt requesting labs prior to CPE. Please advise.

## 2024-04-18 NOTE — Therapy (Signed)
 OUTPATIENT PHYSICAL THERAPY UPPER EXTREMITY TREATMENT   Patient Name: Kyle Lawson MRN: [id number] DOB:12/04/1955, 68 y.o., male Today's Date: 04/18/2024  END OF SESSION:  PT End of Session - 04/18/24 1021     Visit Number 7    Number of Visits 24    Date for PT Re-Evaluation 05/17/24    Authorization Type Medicare    PT Start Time 1020    PT Stop Time 1100    PT Time Calculation (min) 40 min    Activity Tolerance Patient tolerated treatment well    Behavior During Therapy [facility] for tasks assessed/performed              Past Medical History:  Diagnosis Date   Arthritis    Gout    History of kidney stones    Hyperlipidemia    Hypertension    Thoracic aortic aneurysm (HCC) 2022   stable 4cm by CTA 12/2022   Past Surgical History:  Procedure Laterality Date   Anterior Cervical Decompression Fusion  2019   BICEPT TENODESIS Right 02/19/2024   Procedure: SUBPECTORAL BICEPS TENODESIS AND COLLAGEN PATCH AUGMENTATION;  Surgeon: Genelle Standing, MD;  Location: [facility];  Service: Orthopedics;  Laterality: Right;   DISTAL CLAVICLE EXCISION Right 2020   Right Shoulder Distal Clavicle Excision   JOINT REPLACEMENT  2008, 2013   Bilateral hip replacements   LAMINECTOMY  1984   L4-L5   LAMINECTOMY  1996   L3-L4   LUMBAR FUSION  01/11/2021   may 2022- L3-L5   LUMBAR FUSION  09/29/2021   L2-L3   SHOULDER ARTHROSCOPY WITH ROTATOR CUFF REPAIR Right 02/19/2024   Procedure: RIGHT SHOULDER ARTHROSCOPY;  Surgeon: Genelle Standing, MD;  Location: [facility];  Service: Orthopedics;  Laterality: Right;   SPINE SURGERY  2019, 2022, 2023   C4-C6 fusion, L2-L5 fusion   TOTAL HIP ARTHROPLASTY Left    left hip 09/2006   TOTAL HIP ARTHROPLASTY  09/14/2011   Right. Procedure: TOTAL HIP ARTHROPLASTY;  Surgeon: Toribio JULIANNA Chancy, MD;  Location: [facility];  Service: Orthopedics;  Laterality: Right;   Patient Active Problem List   Diagnosis Date Noted   Biceps rupture,  proximal, right, initial encounter 02/19/2024   Nontraumatic incomplete tear of right rotator cuff 02/19/2024   Fatty liver 05/20/2022   Essential hypertension 11/04/2021   Hyperlipidemia, unspecified 11/04/2021   Ascending aorta dilation (HCC) 11/04/2021   Spinal stenosis of lumbar region 09/29/2021   HNP (herniated nucleus pulposus), lumbar 01/11/2021   Acromioclavicular joint pain 07/13/2019   Sternum pain 01/16/2019   Right anterior shoulder pain 11/08/2017   Neck pain 07/04/2017   Gout 02/05/2017   Lumbar spondylosis 01/19/2017   Acute bilateral low back pain with bilateral sciatica 01/19/2017    PCP: [name]  REFERRING PROVIDER: Garnette Genelle   REFERRING DIAG: s/p R bicep tenodesis and collagen augmentation   THERAPY DIAG:  Acute pain of right shoulder  Muscle weakness (generalized)  Rationale for Evaluation and Treatment: Rehabilitation  ONSET DATE: 02/19/24  SUBJECTIVE:  SUBJECTIVE STATEMENT:  04/18/2024 Pt states doing well.  6/16 DOS: SUBPECTORAL BICEPS TENODESIS AND COLLAGEN PATCH AUGMENTATION;  Doing well since surgery, eating well, trying to sleep, has been propped up in bed with pillow under arm. Wearing sling today. He re-dressed his incisions yesterday to shower. Pt is retired, but very active. He has been able to resume walking in the neighborhood.   Hand dominance: Right  PERTINENT HISTORY: HTN, Thoracic aortic aneurysm, Bil THA, Lumbar: 2 previous laminectomy and 2 fusions L2-5. Cervical ACDF/ fusion C4-6;  R shoulder DCE 2020;   PAIN:  Are you having pain? Yes: NPRS scale: 1-2/10 Pain location: R shoulder  Pain description: sore Aggravating factors: none stated  Relieving factors: none stated   PRECAUTIONS: See protocol for surgical precautions   RED  FLAGS: None   WEIGHT BEARING RESTRICTIONS: Yes, limited for R UE  FALLS:  Has patient fallen in last 6 months? No  PLOF: Independent  PATIENT GOALS: Decreased pain in R shoulder/arm, improved use and reaching.   NEXT MD VISIT:   OBJECTIVE:   DIAGNOSTIC FINDINGS:    PATIENT SURVEYS :    COGNITION: Overall cognitive status: Within functional limits for tasks assessed     SENSATION: WFL  POSTURE:   UPPER EXTREMITY ROM:   Active  ROM Right eval Left eval Right  04/02/24  Shoulder flexion Prom: up to 85 deg today, (bent elbow)  A: 140    Shoulder extension     Shoulder abduction   A: 140  Shoulder adduction     Shoulder internal rotation     Shoulder external rotation     Elbow flexion WFL  wfl  Elbow extension Not tested to full extension  wfl  Wrist flexion     Wrist extension     Wrist ulnar deviation     Wrist radial deviation     Wrist pronation     Wrist supination     (Blank rows = not tested)  UPPER EXTREMITY MMT:  not tested due to surgery   MMT Right eval Left eval  Shoulder flexion    Shoulder extension    Shoulder abduction    Shoulder adduction    Shoulder internal rotation    Shoulder external rotation    Middle trapezius    Lower trapezius    Elbow flexion    Elbow extension    Wrist flexion    Wrist extension    Wrist ulnar deviation    Wrist radial deviation    Wrist pronation    Wrist supination    Grip strength (lbs)    (Blank rows = not tested)  SHOULDER SPECIAL TESTS:   JOINT MOBILITY TESTING:  Mild hypomobility  PALPATION:      TODAY'S TREATMENT:  DATE:   04/18/2024 Therapeutic Exercise: Aerobic: UBE L1 x 4; fwd and bwd  Supine:  shoulder flexion, abd, ER prom Seated:  Standing:  flexion stretch at rail x 5;  Row BlueTB 2x10;  shoulder ext GTB 2x10; shoulder external  rotation GTB 2x10 (attention to back posture) ; tricep - kickback review for HEP/gym x10;    bicep curl 6 lb  x15;  hammer curl 6 lb GTB x15 Stretches:  Neuromuscular Re-education: Manual Therapy: ghj mobs post and inf, LAD on R;  Therapeutic Activity: progression for rom/strength for reaching Wall push ups 2 x 10;  Shoulder pulleys flexion, abd x 2 min each Arom/flexion x 10   Self Care:   PATIENT EDUCATION:  Education details: updated and reviewed HEP Person educated: Patient Education method: Explanation, Demonstration, Tactile cues, Verbal cues, and Handouts Education comprehension: verbalized understanding, returned demonstration, verbal cues required, tactile cues required, and needs further education   HOME EXERCISE PROGRAM: Access Code: [email or web address] URL: [email or web address] Date: 02/23/2024 Prepared by: Tinnie Don  Exercises - Supine Shoulder Flexion AAROM  - 2 x daily - 1 sets - 10-12 reps - Supported Elbow Flexion Extension PROM  - 2 x daily - 1 sets - 10-15 reps - Wrist Extension AROM  - 2 x daily - 1 sets - 10 reps  ASSESSMENT:  CLINICAL IMPRESSION: Pt progressing well. He has improving pain and function. He has improving ROM for flexion, near normal, but still lacking full end range motion. Will benefit from continued work on motion and progressive strengthening.   Eval:  Patient presents with deficits following R shoulder surgery on 6/16 for bicep tenodesis and collagen patch for rotator cuff tear. Pt with healing incisions, dressed today. Pt with good tolerance for initial movement and prom. Instructed on post op precautions , sling wear and rom limitations to follow at this time. Pt with decreased ability for full functional activities, reaching, lifting, carrying, and IADLs. He has inability for functional use of ue at this tim. Pt to benefit from skilled PT to improve deficits and pain. He will return for 1 more visit, then is going out of state  for 1 month.    OBJECTIVE IMPAIRMENTS: decreased activity tolerance, decreased knowledge of use of DME, decreased mobility, decreased ROM, decreased strength, increased muscle spasms, impaired flexibility, impaired UE functional use, and pain.   ACTIVITY LIMITATIONS: carrying, lifting, bathing, toileting, dressing, self feeding, reach over head, hygiene/grooming, and locomotion level  PARTICIPATION LIMITATIONS: meal prep, cleaning, laundry, driving, shopping, community activity, and yard work  PERSONAL FACTORS: 1 comorbidity: none are also affecting patient's functional outcome.   REHAB POTENTIAL: Good  CLINICAL DECISION MAKING: Stable/uncomplicated  EVALUATION COMPLEXITY: Low  GOALS: Goals reviewed with patient? Yes   SHORT TERM GOALS: Target date: 03/08/2024   Pt to be independent with initial HEP  Goal status: MET  2.  Pt to understand post op guidelines for next 4 weeks while is out of state.   Goal status: MET    LONG TERM GOALS: Target date: 05/17/2024  Pt to be independent with final HEP  Goal status: INITIAL  2.  Pt to demo improved AROM to be [facility] and pain free, to improve ability for ADLs.   Goal status: INITIAL  3.  Pt to demo improved strength to be at least 4/5, to improve ability for reach, lift, and IADLs.   Goal status: INITIAL  4.  Pt to repots decreased pain to 0-2/10 with activity and use of R UE.  Goal status: INITIAL    PLAN: PT FREQUENCY: 1-2x/week  PT DURATION: 12 weeks  PLANNED INTERVENTIONS: Therapeutic exercises, Therapeutic activity, Neuromuscular re-education, Patient/Family education, Self Care, Joint mobilization, Joint manipulation, Stair training, DME instructions, Aquatic Therapy, Dry Needling, Electrical stimulation, Cryotherapy, Moist heat, Taping, Ultrasound, Ionotophoresis 4mg /ml Dexamethasone , Manual therapy,  Vasopneumatic device, Traction, Spinal manipulation, Spinal mobilization,    PLAN FOR NEXT SESSION: Continue  scapular, bicep and tricep strengthening   Tinnie Don, PT DPT 10:21 AM  04/18/24

## 2024-04-23 ENCOUNTER — Ambulatory Visit (INDEPENDENT_AMBULATORY_CARE_PROVIDER_SITE_OTHER): Admitting: Physical Therapy

## 2024-04-23 ENCOUNTER — Encounter: Payer: Self-pay | Admitting: Physical Therapy

## 2024-04-23 DIAGNOSIS — M6281 Muscle weakness (generalized): Secondary | ICD-10-CM

## 2024-04-23 DIAGNOSIS — M25511 Pain in right shoulder: Secondary | ICD-10-CM | POA: Diagnosis not present

## 2024-04-23 NOTE — Therapy (Signed)
 OUTPATIENT PHYSICAL THERAPY UPPER EXTREMITY TREATMENT   Patient Name: Kyle Lawson MRN: [id number] DOB:13-Apr-1956, 68 y.o., male Today's Date: 04/23/2024  END OF SESSION:  PT End of Session - 04/23/24 1010     Visit Number 8    Number of Visits 24    Date for PT Re-Evaluation 05/17/24    Authorization Type Medicare    PT Start Time 1012    PT Stop Time 1055    PT Time Calculation (min) 43 min    Activity Tolerance Patient tolerated treatment well    Behavior During Therapy [facility] for tasks assessed/performed              Past Medical History:  Diagnosis Date   Arthritis    Gout    History of kidney stones    Hyperlipidemia    Hypertension    Thoracic aortic aneurysm (HCC) 2022   stable 4cm by CTA 12/2022   Past Surgical History:  Procedure Laterality Date   Anterior Cervical Decompression Fusion  2019   BICEPT TENODESIS Right 02/19/2024   Procedure: SUBPECTORAL BICEPS TENODESIS AND COLLAGEN PATCH AUGMENTATION;  Surgeon: Genelle Standing, MD;  Location: [facility];  Service: Orthopedics;  Laterality: Right;   DISTAL CLAVICLE EXCISION Right 2020   Right Shoulder Distal Clavicle Excision   JOINT REPLACEMENT  2008, 2013   Bilateral hip replacements   LAMINECTOMY  1984   L4-L5   LAMINECTOMY  1996   L3-L4   LUMBAR FUSION  01/11/2021   may 2022- L3-L5   LUMBAR FUSION  09/29/2021   L2-L3   SHOULDER ARTHROSCOPY WITH ROTATOR CUFF REPAIR Right 02/19/2024   Procedure: RIGHT SHOULDER ARTHROSCOPY;  Surgeon: Genelle Standing, MD;  Location: [facility];  Service: Orthopedics;  Laterality: Right;   SPINE SURGERY  2019, 2022, 2023   C4-C6 fusion, L2-L5 fusion   TOTAL HIP ARTHROPLASTY Left    left hip 09/2006   TOTAL HIP ARTHROPLASTY  09/14/2011   Right. Procedure: TOTAL HIP ARTHROPLASTY;  Surgeon: Toribio JULIANNA Chancy, MD;  Location: [facility] OR;  Service: Orthopedics;  Laterality: Right;   Patient Active Problem List   Diagnosis Date Noted   Biceps rupture,  proximal, right, initial encounter 02/19/2024   Nontraumatic incomplete tear of right rotator cuff 02/19/2024   Fatty liver 05/20/2022   Essential hypertension 11/04/2021   Hyperlipidemia, unspecified 11/04/2021   Ascending aorta dilation (HCC) 11/04/2021   Spinal stenosis of lumbar region 09/29/2021   HNP (herniated nucleus pulposus), lumbar 01/11/2021   Acromioclavicular joint pain 07/13/2019   Sternum pain 01/16/2019   Right anterior shoulder pain 11/08/2017   Neck pain 07/04/2017   Gout 02/05/2017   Lumbar spondylosis 01/19/2017   Acute bilateral low back pain with bilateral sciatica 01/19/2017    PCP: [name]  REFERRING PROVIDER: Garnette Genelle   REFERRING DIAG: s/p R bicep tenodesis and collagen augmentation   THERAPY DIAG:  Acute pain of right shoulder  Muscle weakness (generalized)  Rationale for Evaluation and Treatment: Rehabilitation  ONSET DATE: 02/19/24  SUBJECTIVE:  SUBJECTIVE STATEMENT:  04/23/2024 Pt states doing well.  6/16 DOS: SUBPECTORAL BICEPS TENODESIS AND COLLAGEN PATCH AUGMENTATION;  Doing well since surgery, eating well, trying to sleep, has been propped up in bed with pillow under arm. Wearing sling today. He re-dressed his incisions yesterday to shower. Pt is retired, but very active. He has been able to resume walking in the neighborhood.   Hand dominance: Right  PERTINENT HISTORY: HTN, Thoracic aortic aneurysm, Bil THA, Lumbar: 2 previous laminectomy and 2 fusions L2-5. Cervical ACDF/ fusion C4-6;  R shoulder DCE 2020;   PAIN:  Are you having pain? Yes: NPRS scale: 1-2/10 Pain location: R shoulder  Pain description: sore Aggravating factors: none stated  Relieving factors: none stated   PRECAUTIONS: See protocol for surgical precautions   RED  FLAGS: None   WEIGHT BEARING RESTRICTIONS: Yes, limited for R UE  FALLS:  Has patient fallen in last 6 months? No  PLOF: Independent  PATIENT GOALS: Decreased pain in R shoulder/arm, improved use and reaching.   NEXT MD VISIT:   OBJECTIVE:   DIAGNOSTIC FINDINGS:    PATIENT SURVEYS :    COGNITION: Overall cognitive status: Within functional limits for tasks assessed     SENSATION: WFL  POSTURE:   UPPER EXTREMITY ROM:   Active  ROM Right eval Left eval Right  04/02/24  Shoulder flexion Prom: up to 85 deg today, (bent elbow)  A: 140    Shoulder extension     Shoulder abduction   A: 140  Shoulder adduction     Shoulder internal rotation     Shoulder external rotation     Elbow flexion WFL  wfl  Elbow extension Not tested to full extension  wfl  Wrist flexion     Wrist extension     Wrist ulnar deviation     Wrist radial deviation     Wrist pronation     Wrist supination     (Blank rows = not tested)  UPPER EXTREMITY MMT:  not tested due to surgery   MMT Right eval Left eval  Shoulder flexion    Shoulder extension    Shoulder abduction    Shoulder adduction    Shoulder internal rotation    Shoulder external rotation    Middle trapezius    Lower trapezius    Elbow flexion    Elbow extension    Wrist flexion    Wrist extension    Wrist ulnar deviation    Wrist radial deviation    Wrist pronation    Wrist supination    Grip strength (lbs)    (Blank rows = not tested)  SHOULDER SPECIAL TESTS:   JOINT MOBILITY TESTING:  Mild hypomobility  PALPATION:      TODAY'S TREATMENT:  DATE:   04/23/2024 Therapeutic Exercise: Aerobic: UBE L1 x 4; fwd and bwd  Supine:  shoulder flexion, abd, ER prom,  Er butterfly rom x 15;  Seated:  Standing:  Row BlueTB 2x10;  shoulder external rotation GTB 2x10 (attention to  back posture) ;  bicep curl 6 lb  x15;  hammer curl 6 lb GTB x15 Stretches:  Neuromuscular Re-education: Manual Therapy: ghj mobs post and inf, LAD on R;  Therapeutic Activity: progression for rom/strength for reaching Wall push ups 2 x 10;  Shoulder pulleys flexion, abd x 2 min each Arom/flexion,  abd, 2 x 10   Self Care:    PATIENT EDUCATION:  Education details: updated and reviewed HEP Person educated: Patient Education method: Explanation, Demonstration, Tactile cues, Verbal cues, and Handouts Education comprehension: verbalized understanding, returned demonstration, verbal cues required, tactile cues required, and needs further education   HOME EXERCISE PROGRAM: Access Code: [email or web address] URL: [email or web address] Date: 02/23/2024 Prepared by: Tinnie Don  Exercises - Supine Shoulder Flexion AAROM  - 2 x daily - 1 sets - 10-12 reps - Supported Elbow Flexion Extension PROM  - 2 x daily - 1 sets - 10-15 reps - Wrist Extension AROM  - 2 x daily - 1 sets - 10 reps  ASSESSMENT:  CLINICAL IMPRESSION: Pt progressing well. He has improving pain and function. He has improving pROM and arom today,  near normal, with less pain. . Will benefit from continued work on motion and progressive strengthening.   Eval:  Patient presents with deficits following R shoulder surgery on 6/16 for bicep tenodesis and collagen patch for rotator cuff tear. Pt with healing incisions, dressed today. Pt with good tolerance for initial movement and prom. Instructed on post op precautions , sling wear and rom limitations to follow at this time. Pt with decreased ability for full functional activities, reaching, lifting, carrying, and IADLs. He has inability for functional use of ue at this tim. Pt to benefit from skilled PT to improve deficits and pain. He will return for 1 more visit, then is going out of state for 1 month.    OBJECTIVE IMPAIRMENTS: decreased activity tolerance, decreased  knowledge of use of DME, decreased mobility, decreased ROM, decreased strength, increased muscle spasms, impaired flexibility, impaired UE functional use, and pain.   ACTIVITY LIMITATIONS: carrying, lifting, bathing, toileting, dressing, self feeding, reach over head, hygiene/grooming, and locomotion level  PARTICIPATION LIMITATIONS: meal prep, cleaning, laundry, driving, shopping, community activity, and yard work  PERSONAL FACTORS: 1 comorbidity: none are also affecting patient's functional outcome.   REHAB POTENTIAL: Good  CLINICAL DECISION MAKING: Stable/uncomplicated  EVALUATION COMPLEXITY: Low  GOALS: Goals reviewed with patient? Yes   SHORT TERM GOALS: Target date: 03/08/2024   Pt to be independent with initial HEP  Goal status: MET  2.  Pt to understand post op guidelines for next 4 weeks while is out of state.   Goal status: MET    LONG TERM GOALS: Target date: 05/17/2024  Pt to be independent with final HEP  Goal status: INITIAL  2.  Pt to demo improved AROM to be [facility] and pain free, to improve ability for ADLs.   Goal status: INITIAL  3.  Pt to demo improved strength to be at least 4/5, to improve ability for reach, lift, and IADLs.   Goal status: INITIAL  4.  Pt to repots decreased pain to 0-2/10 with activity and use of R UE.   Goal status: INITIAL    PLAN: PT FREQUENCY:  1-2x/week  PT DURATION: 12 weeks  PLANNED INTERVENTIONS: Therapeutic exercises, Therapeutic activity, Neuromuscular re-education, Patient/Family education, Self Care, Joint mobilization, Joint manipulation, Stair training, DME instructions, Aquatic Therapy, Dry Needling, Electrical stimulation, Cryotherapy, Moist heat, Taping, Ultrasound, Ionotophoresis 4mg /ml Dexamethasone , Manual therapy,  Vasopneumatic device, Traction, Spinal manipulation, Spinal mobilization,    PLAN FOR NEXT SESSION: Continue scapular, bicep and tricep strengthening   Tinnie Don, PT DPT 10:10 AM   04/23/24

## 2024-04-25 ENCOUNTER — Encounter: Admitting: Physical Therapy

## 2024-04-30 ENCOUNTER — Ambulatory Visit (INDEPENDENT_AMBULATORY_CARE_PROVIDER_SITE_OTHER): Admitting: Physical Therapy

## 2024-04-30 ENCOUNTER — Encounter: Payer: Self-pay | Admitting: Physical Therapy

## 2024-04-30 DIAGNOSIS — M25511 Pain in right shoulder: Secondary | ICD-10-CM | POA: Diagnosis not present

## 2024-04-30 DIAGNOSIS — M6281 Muscle weakness (generalized): Secondary | ICD-10-CM | POA: Diagnosis not present

## 2024-04-30 NOTE — Therapy (Signed)
 OUTPATIENT PHYSICAL THERAPY UPPER EXTREMITY TREATMENT   Patient Name: Kyle Lawson MRN: [id number] DOB:1955-10-21, 68 y.o., male Today's Date: 04/30/2024  END OF SESSION:  PT End of Session - 04/30/24 1017     Visit Number 9    Number of Visits 24    Date for PT Re-Evaluation 05/17/24    Authorization Type Medicare    PT Start Time 1018    PT Stop Time 1100    PT Time Calculation (min) 42 min    Activity Tolerance Patient tolerated treatment well    Behavior During Therapy [facility] for tasks assessed/performed              Past Medical History:  Diagnosis Date   Arthritis    Gout    History of kidney stones    Hyperlipidemia    Hypertension    Thoracic aortic aneurysm (HCC) 2022   stable 4cm by CTA 12/2022   Past Surgical History:  Procedure Laterality Date   Anterior Cervical Decompression Fusion  2019   BICEPT TENODESIS Right 02/19/2024   Procedure: SUBPECTORAL BICEPS TENODESIS AND COLLAGEN PATCH AUGMENTATION;  Surgeon: Genelle Standing, MD;  Location: [facility];  Service: Orthopedics;  Laterality: Right;   DISTAL CLAVICLE EXCISION Right 2020   Right Shoulder Distal Clavicle Excision   JOINT REPLACEMENT  2008, 2013   Bilateral hip replacements   LAMINECTOMY  1984   L4-L5   LAMINECTOMY  1996   L3-L4   LUMBAR FUSION  01/11/2021   may 2022- L3-L5   LUMBAR FUSION  09/29/2021   L2-L3   SHOULDER ARTHROSCOPY WITH ROTATOR CUFF REPAIR Right 02/19/2024   Procedure: RIGHT SHOULDER ARTHROSCOPY;  Surgeon: Genelle Standing, MD;  Location: [facility];  Service: Orthopedics;  Laterality: Right;   SPINE SURGERY  2019, 2022, 2023   C4-C6 fusion, L2-L5 fusion   TOTAL HIP ARTHROPLASTY Left    left hip 09/2006   TOTAL HIP ARTHROPLASTY  09/14/2011   Right. Procedure: TOTAL HIP ARTHROPLASTY;  Surgeon: Toribio JULIANNA Chancy, MD;  Location: [facility];  Service: Orthopedics;  Laterality: Right;   Patient Active Problem List   Diagnosis Date Noted   Biceps rupture,  proximal, right, initial encounter 02/19/2024   Nontraumatic incomplete tear of right rotator cuff 02/19/2024   Fatty liver 05/20/2022   Essential hypertension 11/04/2021   Hyperlipidemia, unspecified 11/04/2021   Ascending aorta dilation (HCC) 11/04/2021   Spinal stenosis of lumbar region 09/29/2021   HNP (herniated nucleus pulposus), lumbar 01/11/2021   Acromioclavicular joint pain 07/13/2019   Sternum pain 01/16/2019   Right anterior shoulder pain 11/08/2017   Neck pain 07/04/2017   Gout 02/05/2017   Lumbar spondylosis 01/19/2017   Acute bilateral low back pain with bilateral sciatica 01/19/2017    PCP: [name]  REFERRING PROVIDER: Garnette Genelle   REFERRING DIAG: s/p R bicep tenodesis and collagen augmentation   THERAPY DIAG:  Acute pain of right shoulder  Muscle weakness (generalized)  Rationale for Evaluation and Treatment: Rehabilitation  ONSET DATE: 02/19/24  SUBJECTIVE:  SUBJECTIVE STATEMENT:  04/30/2024 Pt states doing well.Little pain, has been able to do HEP.   6/16 DOS: SUBPECTORAL BICEPS TENODESIS AND COLLAGEN PATCH AUGMENTATION;  Doing well since surgery, eating well, trying to sleep, has been propped up in bed with pillow under arm. Wearing sling today. He re-dressed his incisions yesterday to shower. Pt is retired, but very active. He has been able to resume walking in the neighborhood.   Hand dominance: Right  PERTINENT HISTORY: HTN, Thoracic aortic aneurysm, Bil THA, Lumbar: 2 previous laminectomy and 2 fusions L2-5. Cervical ACDF/ fusion C4-6;  R shoulder DCE 2020;   PAIN:  Are you having pain? Yes: NPRS scale: 1-2/10 Pain location: R shoulder  Pain description: sore Aggravating factors: none stated  Relieving factors: none stated   PRECAUTIONS: See protocol  for surgical precautions   RED FLAGS: None   WEIGHT BEARING RESTRICTIONS: Yes, limited for R UE  FALLS:  Has patient fallen in last 6 months? No  PLOF: Independent  PATIENT GOALS: Decreased pain in R shoulder/arm, improved use and reaching.   NEXT MD VISIT:   OBJECTIVE:   DIAGNOSTIC FINDINGS:    PATIENT SURVEYS :    COGNITION: Overall cognitive status: Within functional limits for tasks assessed     SENSATION: WFL  POSTURE:   UPPER EXTREMITY ROM:   Active  ROM Right eval Left eval Right  04/02/24  Shoulder flexion Prom: up to 85 deg today, (bent elbow)  A: 140    Shoulder extension     Shoulder abduction   A: 140  Shoulder adduction     Shoulder internal rotation     Shoulder external rotation     Elbow flexion WFL  wfl  Elbow extension Not tested to full extension  wfl  Wrist flexion     Wrist extension     Wrist ulnar deviation     Wrist radial deviation     Wrist pronation     Wrist supination     (Blank rows = not tested)  UPPER EXTREMITY MMT:  not tested due to surgery   MMT Right eval Left eval  Shoulder flexion    Shoulder extension    Shoulder abduction    Shoulder adduction    Shoulder internal rotation    Shoulder external rotation    Middle trapezius    Lower trapezius    Elbow flexion    Elbow extension    Wrist flexion    Wrist extension    Wrist ulnar deviation    Wrist radial deviation    Wrist pronation    Wrist supination    Grip strength (lbs)    (Blank rows = not tested)  SHOULDER SPECIAL TESTS:   JOINT MOBILITY TESTING:  Mild hypomobility  PALPATION:      TODAY'S TREATMENT:  DATE:   04/30/2024 Therapeutic Exercise: Aerobic: UBE L1 x 4; fwd and bwd  Supine:   Prone:  planks on elbows 20 sec x 2, knees x 1 , reviewed high plank position x 2;  Seated:  Standing:  shoulder  external rotation GTB 2x10 (attention to back posture) ;  bicep curl 6 lb  x20;   Stretches:  Neuromuscular Re-education: Manual Therapy:  Therapeutic Activity:  progression for rom/strength for reaching, push/pull:  Wall push ups 2 x 10;  Chest press Blue TB x 20;  Rev fly GTB x 15;  Fly BlueTB x 20;  Review of lat pull down for gym;   Self Care:    Previous: Therapeutic Exercise: Aerobic: UBE L1 x 4; fwd and bwd  Supine:  shoulder flexion, abd, ER prom,  Er butterfly rom x 15;  Seated:  Standing:  Row BlueTB 2x10;  shoulder external rotation GTB 2x10 (attention to back posture) ;  bicep curl 6 lb  x15;  hammer curl 6 lb GTB x15 Stretches:  Neuromuscular Re-education: Manual Therapy: ghj mobs post and inf, LAD on R;  Therapeutic Activity: progression for rom/strength for reaching Wall push ups 2 x 10;  Shoulder pulleys flexion, abd x 2 min each Arom/flexion,  abd, 2 x 10   Self Care:    PATIENT EDUCATION:  Education details: updated and reviewed HEP Person educated: Patient Education method: Explanation, Demonstration, Tactile cues, Verbal cues, and Handouts Education comprehension: verbalized understanding, returned demonstration, verbal cues required, tactile cues required, and needs further education   HOME EXERCISE PROGRAM: Access Code: [email or web address] URL: [email or web address] Date: 02/23/2024 Prepared by: Tinnie Don  Exercises - Supine Shoulder Flexion AAROM  - 2 x daily - 1 sets - 10-12 reps - Supported Elbow Flexion Extension PROM  - 2 x daily - 1 sets - 10-15 reps - Wrist Extension AROM  - 2 x daily - 1 sets - 10 reps  ASSESSMENT:  CLINICAL IMPRESSION: Pt progressing well. He has improving pain and function. Focus for strength today, with good tolerance, education on shoulder posture, safety and gym equipment. Pt progressing very well. Will return for a couple more visits, to ensure full ROM, and no pain, but likely d/c in next 2-3 visits.     Eval:  Patient presents with deficits following R shoulder surgery on 6/16 for bicep tenodesis and collagen patch for rotator cuff tear. Pt with healing incisions, dressed today. Pt with good tolerance for initial movement and prom. Instructed on post op precautions , sling wear and rom limitations to follow at this time. Pt with decreased ability for full functional activities, reaching, lifting, carrying, and IADLs. He has inability for functional use of ue at this tim. Pt to benefit from skilled PT to improve deficits and pain. He will return for 1 more visit, then is going out of state for 1 month.    OBJECTIVE IMPAIRMENTS: decreased activity tolerance, decreased knowledge of use of DME, decreased mobility, decreased ROM, decreased strength, increased muscle spasms, impaired flexibility, impaired UE functional use, and pain.   ACTIVITY LIMITATIONS: carrying, lifting, bathing, toileting, dressing, self feeding, reach over head, hygiene/grooming, and locomotion level  PARTICIPATION LIMITATIONS: meal prep, cleaning, laundry, driving, shopping, community activity, and yard work  PERSONAL FACTORS: 1 comorbidity: none are also affecting patient's functional outcome.   REHAB POTENTIAL: Good  CLINICAL DECISION MAKING: Stable/uncomplicated  EVALUATION COMPLEXITY: Low  GOALS: Goals reviewed with patient? Yes   SHORT TERM GOALS: Target date: 03/08/2024   Pt to  be independent with initial HEP  Goal status: MET  2.  Pt to understand post op guidelines for next 4 weeks while is out of state.   Goal status: MET    LONG TERM GOALS: Target date: 05/17/2024  Pt to be independent with final HEP  Goal status: INITIAL  2.  Pt to demo improved AROM to be [facility] and pain free, to improve ability for ADLs.   Goal status: INITIAL  3.  Pt to demo improved strength to be at least 4/5, to improve ability for reach, lift, and IADLs.   Goal status: INITIAL  4.  Pt to repots decreased pain to  0-2/10 with activity and use of R UE.   Goal status: INITIAL    PLAN: PT FREQUENCY: 1-2x/week  PT DURATION: 12 weeks  PLANNED INTERVENTIONS: Therapeutic exercises, Therapeutic activity, Neuromuscular re-education, Patient/Family education, Self Care, Joint mobilization, Joint manipulation, Stair training, DME instructions, Aquatic Therapy, Dry Needling, Electrical stimulation, Cryotherapy, Moist heat, Taping, Ultrasound, Ionotophoresis 4mg /ml Dexamethasone , Manual therapy,  Vasopneumatic device, Traction, Spinal manipulation, Spinal mobilization,    PLAN FOR NEXT SESSION: Continue scapular, bicep and tricep strengthening   Tinnie Don, PT DPT 11:45 AM  04/30/24

## 2024-05-02 ENCOUNTER — Encounter: Admitting: Physical Therapy

## 2024-05-14 ENCOUNTER — Ambulatory Visit (INDEPENDENT_AMBULATORY_CARE_PROVIDER_SITE_OTHER): Admitting: Physical Therapy

## 2024-05-14 ENCOUNTER — Encounter: Payer: Self-pay | Admitting: Physical Therapy

## 2024-05-14 DIAGNOSIS — M25511 Pain in right shoulder: Secondary | ICD-10-CM

## 2024-05-14 DIAGNOSIS — M6281 Muscle weakness (generalized): Secondary | ICD-10-CM | POA: Diagnosis not present

## 2024-05-14 NOTE — Therapy (Signed)
 OUTPATIENT PHYSICAL THERAPY UPPER EXTREMITY TREATMENT/ PN   Patient Name: Kyle Lawson MRN: [id number] DOB:07/01/1956, 68 y.o., male Today's Date: 05/14/2024  Physical Therapy Progress Note  Dates of Reporting Period: 02/23/24 to  05/14/24      END OF SESSION:  PT End of Session - 05/14/24 0846     Visit Number 10    Number of Visits 24    Date for PT Re-Evaluation 05/17/24    Authorization Type Medicare    PT Start Time 0847    PT Stop Time 0930    PT Time Calculation (min) 43 min    Activity Tolerance Patient tolerated treatment well    Behavior During Therapy [facility] for tasks assessed/performed              Past Medical History:  Diagnosis Date   Arthritis    Gout    History of kidney stones    Hyperlipidemia    Hypertension    Thoracic aortic aneurysm (HCC) 2022   stable 4cm by CTA 12/2022   Past Surgical History:  Procedure Laterality Date   Anterior Cervical Decompression Fusion  2019   BICEPT TENODESIS Right 02/19/2024   Procedure: SUBPECTORAL BICEPS TENODESIS AND COLLAGEN PATCH AUGMENTATION;  Surgeon: Genelle Standing, MD;  Location: [facility];  Service: Orthopedics;  Laterality: Right;   DISTAL CLAVICLE EXCISION Right 2020   Right Shoulder Distal Clavicle Excision   JOINT REPLACEMENT  2008, 2013   Bilateral hip replacements   LAMINECTOMY  1984   L4-L5   LAMINECTOMY  1996   L3-L4   LUMBAR FUSION  01/11/2021   may 2022- L3-L5   LUMBAR FUSION  09/29/2021   L2-L3   SHOULDER ARTHROSCOPY WITH ROTATOR CUFF REPAIR Right 02/19/2024   Procedure: RIGHT SHOULDER ARTHROSCOPY;  Surgeon: Genelle Standing, MD;  Location: [facility];  Service: Orthopedics;  Laterality: Right;   SPINE SURGERY  2019, 2022, 2023   C4-C6 fusion, L2-L5 fusion   TOTAL HIP ARTHROPLASTY Left    left hip 09/2006   TOTAL HIP ARTHROPLASTY  09/14/2011   Right. Procedure: TOTAL HIP ARTHROPLASTY;  Surgeon: Toribio JULIANNA Chancy, MD;  Location: [facility];  Service: Orthopedics;   Laterality: Right;   Patient Active Problem List   Diagnosis Date Noted   Biceps rupture, proximal, right, initial encounter 02/19/2024   Nontraumatic incomplete tear of right rotator cuff 02/19/2024   Fatty liver 05/20/2022   Essential hypertension 11/04/2021   Hyperlipidemia, unspecified 11/04/2021   Ascending aorta dilation (HCC) 11/04/2021   Spinal stenosis of lumbar region 09/29/2021   HNP (herniated nucleus pulposus), lumbar 01/11/2021   Acromioclavicular joint pain 07/13/2019   Sternum pain 01/16/2019   Right anterior shoulder pain 11/08/2017   Neck pain 07/04/2017   Gout 02/05/2017   Lumbar spondylosis 01/19/2017   Acute bilateral low back pain with bilateral sciatica 01/19/2017    PCP: [name]  REFERRING PROVIDER: Garnette Genelle   REFERRING DIAG: s/p R bicep tenodesis and collagen augmentation   THERAPY DIAG:  Acute pain of right shoulder  Muscle weakness (generalized)  Rationale for Evaluation and Treatment: Rehabilitation  ONSET DATE: 02/19/24  SUBJECTIVE:  SUBJECTIVE STATEMENT:  05/14/2024 Pt states doing well. He has had a bit more soreness in bil shoulders in the last week. Took a couple days off of doing exercise, but has not made a difference. He has MD f/u this week.   6/16 DOS: SUBPECTORAL BICEPS TENODESIS AND COLLAGEN PATCH AUGMENTATION;  Doing well since surgery, eating well, trying to sleep, has been propped up in bed with pillow under arm. Wearing sling today. He re-dressed his incisions yesterday to shower. Pt is retired, but very active. He has been able to resume walking in the neighborhood.   Hand dominance: Right  PERTINENT HISTORY: HTN, Thoracic aortic aneurysm, Bil THA, Lumbar: 2 previous laminectomy and 2 fusions L2-5. Cervical ACDF/ fusion C4-6;  R  shoulder DCE 2020;   PAIN:  Are you having pain? Yes: NPRS scale: 1-2/10 Pain location: R shoulder  Pain description: sore Aggravating factors: none stated  Relieving factors: none stated   PRECAUTIONS: See protocol for surgical precautions   RED FLAGS: None   WEIGHT BEARING RESTRICTIONS: Yes, limited for R UE  FALLS:  Has patient fallen in last 6 months? No  PLOF: Independent  PATIENT GOALS: Decreased pain in R shoulder/arm, improved use and reaching.   NEXT MD VISIT:   OBJECTIVE: updated 9/9   DIAGNOSTIC FINDINGS:    PATIENT SURVEYS :    COGNITION: Overall cognitive status: Within functional limits for tasks assessed     SENSATION: WFL  POSTURE:   UPPER EXTREMITY ROM:   Active  ROM Right eval Left eval Right  04/02/24 Right 05/14/24  Shoulder flexion Prom: up to 85 deg today, (bent elbow)  A: 140   Right: :140 Left:   140  Shoulder extension      Shoulder abduction   A: 140   Shoulder adduction      Shoulder internal rotation      Shoulder external rotation      Elbow flexion WFL  wfl   Elbow extension Not tested to full extension  wfl   Wrist flexion      Wrist extension      Wrist ulnar deviation      Wrist radial deviation      Wrist pronation      Wrist supination      (Blank rows = not tested)  UPPER EXTREMITY MMT:  MMT Right 05/14/24 Left 05/14/24   Shoulder flexion 5 4+  Shoulder extension    Shoulder abduction 5 4+  Shoulder adduction    Shoulder internal rotation    Shoulder external rotation 5 5  Middle trapezius    Lower trapezius    Elbow flexion 5 5  Elbow extension    Wrist flexion    Wrist extension    Wrist ulnar deviation    Wrist radial deviation    Wrist pronation    Wrist supination    Grip strength (lbs)    (Blank rows = not tested)  SHOULDER SPECIAL TESTS:   JOINT MOBILITY TESTING:    PALPATION:      TODAY'S TREATMENT:  DATE:   05/14/2024 Therapeutic Exercise: Aerobic: UBE L1 x 4; fwd and bwd  Supine:   Prone:   Seated:  Standing:   Shoulder external rotation and IR with GTB x 20 ea, education on form;  bicep curl 6 lb  x20;   Stretches:  Neuromuscular Re-education: Manual Therapy:  Therapeutic Activity:  progression for rom/strength for reaching, push/pull:  Wall push ups 2 x 10;  Chest press Blue TB x 20;   Low row GTB x 15;   Row black TB x 15;  Flexion/arom x 15 bil Self Care:    Previous: Therapeutic Exercise: Aerobic: UBE L1 x 4; fwd and bwd  Supine:  shoulder flexion, abd, ER prom,  Er butterfly rom x 15;  Seated:  Standing:  Row BlueTB 2x10;  shoulder external rotation GTB 2x10 (attention to back posture) ;  bicep curl 6 lb  x15;  hammer curl 6 lb GTB x15 Stretches:  Neuromuscular Re-education: Manual Therapy: ghj mobs post and inf, LAD on R;  Therapeutic Activity: progression for rom/strength for reaching Wall push ups 2 x 10;  Shoulder pulleys flexion, abd x 2 min each Arom/flexion,  abd, 2 x 10   Self Care:    PATIENT EDUCATION:  Education details: updated and reviewed HEP Person educated: Patient Education method: Explanation, Demonstration, Tactile cues, Verbal cues, and Handouts Education comprehension: verbalized understanding, returned demonstration, verbal cues required, tactile cues required, and needs further education   HOME EXERCISE PROGRAM: Access Code: [email or web address]   ASSESSMENT:  CLINICAL IMPRESSION: Pt has been seen for 10 visits. He is progressing very well. He has rom now WNL. He is doing very well with strengthening and HEP. Reviewed strength and gym activities . Pt will see MD this week. He will return for 1 more PT visit, likely d/c to HEP if still doing well at that time.    Eval:  Patient presents with deficits following R shoulder surgery on 6/16 for bicep tenodesis and collagen patch for  rotator cuff tear. Pt with healing incisions, dressed today. Pt with good tolerance for initial movement and prom. Instructed on post op precautions , sling wear and rom limitations to follow at this time. Pt with decreased ability for full functional activities, reaching, lifting, carrying, and IADLs. He has inability for functional use of ue at this tim. Pt to benefit from skilled PT to improve deficits and pain. He will return for 1 more visit, then is going out of state for 1 month.    OBJECTIVE IMPAIRMENTS: decreased activity tolerance, decreased knowledge of use of DME, decreased mobility, decreased ROM, decreased strength, increased muscle spasms, impaired flexibility, impaired UE functional use, and pain.   ACTIVITY LIMITATIONS: carrying, lifting, bathing, toileting, dressing, self feeding, reach over head, hygiene/grooming, and locomotion level  PARTICIPATION LIMITATIONS: meal prep, cleaning, laundry, driving, shopping, community activity, and yard work  PERSONAL FACTORS: 1 comorbidity: none are also affecting patient's functional outcome.   REHAB POTENTIAL: Good  CLINICAL DECISION MAKING: Stable/uncomplicated  EVALUATION COMPLEXITY: Low  GOALS: Goals reviewed with patient? Yes   SHORT TERM GOALS: Target date: 03/08/2024   Pt to be independent with initial HEP  Goal status: MET  2.  Pt to understand post op guidelines for next 4 weeks while is out of state.   Goal status: MET    LONG TERM GOALS: Target date: 05/17/2024  Pt to be independent with final HEP  Goal status: In progress  2.  Pt to demo improved AROM to be [facility]  and pain free, to improve ability for ADLs.   Goal status: MET  3.  Pt to demo improved strength to be at least 4/5, to improve ability for reach, lift, and IADLs.   Goal status: MET  4.  Pt to repots decreased pain to 0-2/10 with activity and use of R UE.   Goal status: In progress    PLAN: PT FREQUENCY: 1-2x/week  PT DURATION: 12  weeks  PLANNED INTERVENTIONS: Therapeutic exercises, Therapeutic activity, Neuromuscular re-education, Patient/Family education, Self Care, Joint mobilization, Joint manipulation, Stair training, DME instructions, Aquatic Therapy, Dry Needling, Electrical stimulation, Cryotherapy, Moist heat, Taping, Ultrasound, Ionotophoresis 4mg /ml Dexamethasone , Manual therapy,  Vasopneumatic device, Traction, Spinal manipulation, Spinal mobilization,    PLAN FOR NEXT SESSION:    Tinnie Don, PT DPT 11:57 AM  05/14/24

## 2024-05-15 ENCOUNTER — Encounter (HOSPITAL_BASED_OUTPATIENT_CLINIC_OR_DEPARTMENT_OTHER): Admitting: Orthopaedic Surgery

## 2024-05-16 ENCOUNTER — Ambulatory Visit (INDEPENDENT_AMBULATORY_CARE_PROVIDER_SITE_OTHER): Admitting: Orthopaedic Surgery

## 2024-05-16 DIAGNOSIS — M25512 Pain in left shoulder: Secondary | ICD-10-CM

## 2024-05-16 DIAGNOSIS — G8929 Other chronic pain: Secondary | ICD-10-CM

## 2024-05-16 NOTE — Progress Notes (Signed)
 Post Operative Evaluation    Procedure/Date of Surgery: Right shoulder arthroscopy with collagen patch augmentation and biceps tenodesis subpectoral 6/16   Interval History:    Presents today status post above procedure.  Overall he is doing very well.  With regard to the left shoulder he did get some temporary relief although this is now becoming more symptomatic   PMH/PSH/Family History/Social History/Meds/Allergies:    Past Medical History:  Diagnosis Date   Arthritis    Gout    History of kidney stones    Hyperlipidemia    Hypertension    Thoracic aortic aneurysm (HCC) 2022   stable 4cm by CTA 12/2022   Past Surgical History:  Procedure Laterality Date   Anterior Cervical Decompression Fusion  2019   BICEPT TENODESIS Right 02/19/2024   Procedure: SUBPECTORAL BICEPS TENODESIS AND COLLAGEN PATCH AUGMENTATION;  Surgeon: Genelle Standing, MD;  Location: [facility];  Service: Orthopedics;  Laterality: Right;   DISTAL CLAVICLE EXCISION Right 2020   Right Shoulder Distal Clavicle Excision   JOINT REPLACEMENT  2008, 2013   Bilateral hip replacements   LAMINECTOMY  1984   L4-L5   LAMINECTOMY  1996   L3-L4   LUMBAR FUSION  01/11/2021   may 2022- L3-L5   LUMBAR FUSION  09/29/2021   L2-L3   SHOULDER ARTHROSCOPY WITH ROTATOR CUFF REPAIR Right 02/19/2024   Procedure: RIGHT SHOULDER ARTHROSCOPY;  Surgeon: Genelle Standing, MD;  Location: [facility];  Service: Orthopedics;  Laterality: Right;   SPINE SURGERY  2019, 2022, 2023   C4-C6 fusion, L2-L5 fusion   TOTAL HIP ARTHROPLASTY Left    left hip 09/2006   TOTAL HIP ARTHROPLASTY  09/14/2011   Right. Procedure: TOTAL HIP ARTHROPLASTY;  Surgeon: Toribio JULIANNA Chancy, MD;  Location: [facility] OR;  Service: Orthopedics;  Laterality: Right;   Social History   Socioeconomic History   Marital status: Married    Spouse name: Not on file   Number of children: 2   Years of education:  Not on file   Highest education level: Not on file  Occupational History   Not on file  Tobacco Use   Smoking status: Never   Smokeless tobacco: Never  Vaping Use   Vaping status: Never Used  Substance and Sexual Activity   Alcohol use: Yes    Alcohol/week: 4.0 - 5.0 standard drinks of alcohol    Types: 4 - 5 Cans of beer per week    Comment: social   Drug use: Yes    Types: Marijuana    Comment: for pain- very occasionally   Sexual activity: Yes    Partners: Female  Other Topics Concern   Not on file  Social History Narrative   Married 28 years in 2023. Daughter 44, son 4 in 2023.    From [place]       Retired from [employer]- enjoying retirement      Hobbies: traveling, hiking, walks daily, live music   Social Drivers of Health   Financial Resource Strain: Low Risk  (07/26/2023)   Overall Financial Resource Strain (CARDIA)    Difficulty of Paying Living Expenses: Not hard at all  Food Insecurity: No Food Insecurity (07/26/2023)   Hunger Vital Sign    Worried About Running Out of Food in the Last Year: Never true  Ran Out of Food in the Last Year: Never true  Transportation Needs: No Transportation Needs (07/26/2023)   PRAPARE - Administrator, Civil Service (Medical): No    Lack of Transportation (Non-Medical): No  Physical Activity: Sufficiently Active (07/26/2023)   Exercise Vital Sign    Days of Exercise per Week: 5 days    Minutes of Exercise per Session: 90 min  Stress: No Stress Concern Present (07/26/2023)   [employer] of Occupational Health - Occupational Stress Questionnaire    Feeling of Stress : Not at all  Social Connections: Moderately Isolated (07/26/2023)   Social Connection and Isolation Panel    Frequency of Communication with Friends and Family: More than three times a week    Frequency of Social Gatherings with Friends and Family: More than three times a week    Attends Religious Services: Never    Database administrator  or Organizations: No    Attends Engineer, structural: Never    Marital Status: Married   Family History  Problem Relation Age of Onset   Diabetes Mother        passed at 76   Skin cancer Father    Gout Father    Hypertension Father        passed at 29   Hyperlipidemia Father    CAD Brother        CABG 30   Cancer Brother    Diabetes Brother    Healthy Brother    Healthy Brother    Alcoholism Brother    Depression Brother        suicide age 62   Healthy Maternal Grandmother        lived to 5   Brain cancer Maternal Grandfather        age 56- was an MD   CAD Paternal Grandmother        died in 64s   CAD Paternal Grandfather        died in 22s   Obesity Sister    Arthritis Sister    Healthy Sister    No Known Allergies Current Outpatient Medications  Medication Sig Dispense Refill   acetaminophen  (TYLENOL ) 500 MG tablet Take 500 mg by mouth every 6 (six) hours as needed (pain.).     allopurinol  (ZYLOPRIM ) 300 MG tablet TAKE 1 TABLET EVERY MORNING 90 tablet 3   aspirin  EC 325 MG tablet Take 1 tablet (325 mg total) by mouth daily. 14 tablet 0   Cholecalciferol (VITAMIN D -3 PO) Take 2,000 Units by mouth in the morning.     colchicine  0.6 MG tablet Take 1 tablet (0.6 mg total) by mouth daily. At start of flare- take 2 and then an hour later (max 3 day one) if not improved can take 1 more, then daily until gout flare resolves (Patient taking differently: Take 0.6 mg by mouth daily as needed (gout attacks).) 30 tablet 3   cyclobenzaprine  (FLEXERIL ) 10 MG tablet Take 1 tablet (10 mg total) by mouth 3 (three) times daily as needed for muscle spasms. 30 tablet 0   diclofenac  Sodium (PENNSAID ) 2 % SOLN Apply 1-2 Pump topically 2 (two) times daily as needed (pain.).     losartan  (COZAAR ) 100 MG tablet Take 1 tablet (100 mg total) by mouth daily. 90 tablet 3   meloxicam  (MOBIC ) 15 MG tablet Take 1 tablet (15 mg total) by mouth daily. 30 tablet 0   Multiple Vitamin  (MULTIVITAMIN WITH MINERALS) TABS tablet Take  1 tablet by mouth in the morning.     oxyCODONE  (ROXICODONE ) 5 MG immediate release tablet Take 1 tablet (5 mg total) by mouth every 4 (four) hours as needed for severe pain (pain score 7-10) or breakthrough pain. 10 tablet 0   simvastatin  (ZOCOR ) 40 MG tablet TAKE 1 TABLET DAILY 90 tablet 3   No current facility-administered medications for this visit.   No results found.  Review of Systems:   A ROS was performed including pertinent positives and negatives as documented in the HPI.   Musculoskeletal Exam:    There were no vitals taken for this visit.  Right shoulder incisions are well-appearing without erythema or drainage.  Active forward elevation is 165 degrees.  External rotation at the side is to 45 internal rotation is to L1.  Left shoulder with tenderness about the anterior lateral aspect of the shoulder with forward elevation to 165 degrees external rotation is to 45 and internal rotation is to L1.  Pain with Neer impingement Imaging:      I personally reviewed and interpreted the radiographs.   Assessment:   status post right shoulder arthroscopy and subpectoral biceps tenodesis overall doing extremely well.  At this time we will continue to work on strengthening the side.  The left side he does appear to have rotator cuff tendinitis and impingement at as well as biceps tendinitis.  He did get some relief from his left shoulder injection but this is not permanent.  This is still sore and keeping him awake at night limiting his ability to do ADLs.  As result we will plan for an MRI of the left shoulder and follow-up discuss results Plan :    - Plan for MRI left shoulder and follow-up discuss results      I personally saw and evaluated the patient, and participated in the management and treatment plan.  Elspeth Parker, MD Attending Physician, Orthopedic Surgery  This document was dictated using Dragon voice recognition  software. A reasonable attempt at proof reading has been made to minimize errors.

## 2024-05-20 ENCOUNTER — Other Ambulatory Visit (INDEPENDENT_AMBULATORY_CARE_PROVIDER_SITE_OTHER)

## 2024-05-20 ENCOUNTER — Ambulatory Visit: Payer: Self-pay | Admitting: Family Medicine

## 2024-05-20 ENCOUNTER — Other Ambulatory Visit (HOSPITAL_BASED_OUTPATIENT_CLINIC_OR_DEPARTMENT_OTHER): Payer: Self-pay | Admitting: Orthopaedic Surgery

## 2024-05-20 ENCOUNTER — Encounter (HOSPITAL_BASED_OUTPATIENT_CLINIC_OR_DEPARTMENT_OTHER): Payer: Self-pay | Admitting: Orthopaedic Surgery

## 2024-05-20 DIAGNOSIS — Z125 Encounter for screening for malignant neoplasm of prostate: Secondary | ICD-10-CM

## 2024-05-20 DIAGNOSIS — G8929 Other chronic pain: Secondary | ICD-10-CM

## 2024-05-20 DIAGNOSIS — E785 Hyperlipidemia, unspecified: Secondary | ICD-10-CM | POA: Diagnosis not present

## 2024-05-20 DIAGNOSIS — I10 Essential (primary) hypertension: Secondary | ICD-10-CM

## 2024-05-20 DIAGNOSIS — Z131 Encounter for screening for diabetes mellitus: Secondary | ICD-10-CM

## 2024-05-20 LAB — COMPREHENSIVE METABOLIC PANEL WITH GFR
ALT: 19 U/L (ref 0–53)
AST: 18 U/L (ref 0–37)
Albumin: 4 g/dL (ref 3.5–5.2)
Alkaline Phosphatase: 70 U/L (ref 39–117)
BUN: 17 mg/dL (ref 6–23)
CO2: 29 meq/L (ref 19–32)
Calcium: 9.3 mg/dL (ref 8.4–10.5)
Chloride: 102 meq/L (ref 96–112)
Creatinine, Ser: 0.74 mg/dL (ref 0.40–1.50)
GFR: 93.33 mL/min (ref >60.00)
Glucose, Bld: 92 mg/dL (ref 70–99)
Potassium: 4.5 meq/L (ref 3.5–5.1)
Sodium: 138 meq/L (ref 135–145)
Total Bilirubin: 0.8 mg/dL (ref 0.2–1.2)
Total Protein: 6.4 g/dL (ref 6.0–8.3)

## 2024-05-20 LAB — CBC WITH DIFFERENTIAL/PLATELET
Basophils Absolute: 0.1 K/uL (ref 0.0–0.1)
Basophils Relative: 0.7 % (ref 0.0–3.0)
Eosinophils Absolute: 0.2 K/uL (ref 0.0–0.7)
Eosinophils Relative: 2.5 % (ref 0.0–5.0)
HCT: 39.2 % (ref 39.0–52.0)
Hemoglobin: 13.1 g/dL (ref 13.0–17.0)
Lymphocytes Relative: 33 % (ref 12.0–46.0)
Lymphs Abs: 2.6 K/uL (ref 0.7–4.0)
MCHC: 33.5 g/dL (ref 30.0–36.0)
MCV: 94.9 fl (ref 78.0–100.0)
Monocytes Absolute: 0.7 K/uL (ref 0.1–1.0)
Monocytes Relative: 9.1 % (ref 3.0–12.0)
Neutro Abs: 4.3 K/uL (ref 1.4–7.7)
Neutrophils Relative %: 54.7 % (ref 43.0–77.0)
Platelets: 280 K/uL (ref 150.0–400.0)
RBC: 4.13 Mil/uL — ABNORMAL LOW (ref 4.22–5.81)
RDW: 13.2 % (ref 11.5–15.5)
WBC: 7.8 K/uL (ref 4.0–10.5)

## 2024-05-20 LAB — PSA, MEDICARE: PSA: 1.79 ng/mL (ref 0.10–4.00)

## 2024-05-21 ENCOUNTER — Ambulatory Visit (INDEPENDENT_AMBULATORY_CARE_PROVIDER_SITE_OTHER): Admitting: Physical Therapy

## 2024-05-21 ENCOUNTER — Encounter: Payer: Self-pay | Admitting: Physical Therapy

## 2024-05-21 ENCOUNTER — Inpatient Hospital Stay
Admission: RE | Admit: 2024-05-21 | Discharge: 2024-05-21 | Discharge status: Home / Self Care | Source: Ambulatory Visit | Attending: Orthopaedic Surgery

## 2024-05-21 DIAGNOSIS — M25511 Pain in right shoulder: Secondary | ICD-10-CM | POA: Diagnosis not present

## 2024-05-21 DIAGNOSIS — G8929 Other chronic pain: Secondary | ICD-10-CM

## 2024-05-21 DIAGNOSIS — M6281 Muscle weakness (generalized): Secondary | ICD-10-CM

## 2024-05-21 LAB — LIPID PANEL
Cholesterol: 197 mg/dL (ref <200)
HDL: 58 mg/dL (ref >=40)
LDL Cholesterol (Calc): 116 mg/dL — ABNORMAL HIGH
Non-HDL Cholesterol (Calc): 139 mg/dL — ABNORMAL HIGH (ref <130)
Total CHOL/HDL Ratio: 3.4 (calc) (ref <5.0)
Triglycerides: 123 mg/dL (ref <150)

## 2024-05-21 LAB — HEMOGLOBIN A1C
Hgb A1c MFr Bld: 5.7 % — ABNORMAL HIGH (ref <5.7)
Mean Plasma Glucose: 117 mg/dL
eAG (mmol/L): 6.5 mmol/L

## 2024-05-21 NOTE — Therapy (Signed)
 OUTPATIENT PHYSICAL THERAPY UPPER EXTREMITY TREATMENT/Re-cert   Patient Name: Kyle Lawson MRN: [id number] DOB:Jan 25, 1956, 68 y.o., male Today's Date: 05/21/2024    END OF SESSION:  PT End of Session - 05/21/24 1147     Visit Number 11    Number of Visits 24    Date for PT Re-Evaluation 06/18/24    Authorization Type Medicare    PT Start Time 1100    PT Stop Time 1141    PT Time Calculation (min) 41 min    Activity Tolerance Patient tolerated treatment well    Behavior During Therapy [facility] for tasks assessed/performed              Past Medical History:  Diagnosis Date   Arthritis    Gout    History of kidney stones    Hyperlipidemia    Hypertension    Thoracic aortic aneurysm (HCC) 2022   stable 4cm by CTA 12/2022   Past Surgical History:  Procedure Laterality Date   Anterior Cervical Decompression Fusion  2019   BICEPT TENODESIS Right 02/19/2024   Procedure: SUBPECTORAL BICEPS TENODESIS AND COLLAGEN PATCH AUGMENTATION;  Surgeon: Genelle Standing, MD;  Location: [facility];  Service: Orthopedics;  Laterality: Right;   DISTAL CLAVICLE EXCISION Right 2020   Right Shoulder Distal Clavicle Excision   JOINT REPLACEMENT  2008, 2013   Bilateral hip replacements   LAMINECTOMY  1984   L4-L5   LAMINECTOMY  1996   L3-L4   LUMBAR FUSION  01/11/2021   may 2022- L3-L5   LUMBAR FUSION  09/29/2021   L2-L3   SHOULDER ARTHROSCOPY WITH ROTATOR CUFF REPAIR Right 02/19/2024   Procedure: RIGHT SHOULDER ARTHROSCOPY;  Surgeon: Genelle Standing, MD;  Location: [facility];  Service: Orthopedics;  Laterality: Right;   SPINE SURGERY  2019, 2022, 2023   C4-C6 fusion, L2-L5 fusion   TOTAL HIP ARTHROPLASTY Left    left hip 09/2006   TOTAL HIP ARTHROPLASTY  09/14/2011   Right. Procedure: TOTAL HIP ARTHROPLASTY;  Surgeon: Toribio JULIANNA Chancy, MD;  Location: [facility];  Service: Orthopedics;  Laterality: Right;   Patient Active Problem List   Diagnosis Date Noted    Biceps rupture, proximal, right, initial encounter 02/19/2024   Nontraumatic incomplete tear of right rotator cuff 02/19/2024   Fatty liver 05/20/2022   Essential hypertension 11/04/2021   Hyperlipidemia, unspecified 11/04/2021   Ascending aorta dilation (HCC) 11/04/2021   Spinal stenosis of lumbar region 09/29/2021   HNP (herniated nucleus pulposus), lumbar 01/11/2021   Acromioclavicular joint pain 07/13/2019   Sternum pain 01/16/2019   Right anterior shoulder pain 11/08/2017   Neck pain 07/04/2017   Gout 02/05/2017   Lumbar spondylosis 01/19/2017   Acute bilateral low back pain with bilateral sciatica 01/19/2017    PCP: [name]  REFERRING PROVIDER: Garnette Genelle   REFERRING DIAG: s/p R bicep tenodesis and collagen augmentation   THERAPY DIAG:  Acute pain of right shoulder  Muscle weakness (generalized)  Rationale for Evaluation and Treatment: Rehabilitation  ONSET DATE: 02/19/24  SUBJECTIVE:  SUBJECTIVE STATEMENT:  05/21/2024 Pt states doing well. He has had a bit more soreness in bil shoulders , and continued in L.  Just had MRI for L shoulder.    6/16 DOS: SUBPECTORAL BICEPS TENODESIS AND COLLAGEN PATCH AUGMENTATION;  Doing well since surgery, eating well, trying to sleep, has been propped up in bed with pillow under arm. Wearing sling today. He re-dressed his incisions yesterday to shower. Pt is retired, but very active. He has been able to resume walking in the neighborhood.   Hand dominance: Right  PERTINENT HISTORY: HTN, Thoracic aortic aneurysm, Bil THA, Lumbar: 2 previous laminectomy and 2 fusions L2-5. Cervical ACDF/ fusion C4-6;  R shoulder DCE 2020;   PAIN:  Are you having pain? Yes: NPRS scale: 1-2/10 Pain location: R shoulder  Pain description: sore Aggravating  factors: none stated  Relieving factors: none stated   PRECAUTIONS: See protocol for surgical precautions   RED FLAGS: None   WEIGHT BEARING RESTRICTIONS: Yes, limited for R UE  FALLS:  Has patient fallen in last 6 months? No  PLOF: Independent  PATIENT GOALS: Decreased pain in R shoulder/arm, improved use and reaching.   NEXT MD VISIT:   OBJECTIVE: updated 9/9   DIAGNOSTIC FINDINGS:    PATIENT SURVEYS :    COGNITION: Overall cognitive status: Within functional limits for tasks assessed     SENSATION: WFL  POSTURE:   UPPER EXTREMITY ROM:   Active  ROM Right eval Left eval Right  04/02/24 Right 05/14/24  Shoulder flexion Prom: up to 85 deg today, (bent elbow)  A: 140   Right: :140 Left:   140  Shoulder extension      Shoulder abduction   A: 140   Shoulder adduction      Shoulder internal rotation      Shoulder external rotation      Elbow flexion WFL  wfl   Elbow extension Not tested to full extension  wfl   Wrist flexion      Wrist extension      Wrist ulnar deviation      Wrist radial deviation      Wrist pronation      Wrist supination      (Blank rows = not tested)  UPPER EXTREMITY MMT:  MMT Right 05/14/24 Left 05/14/24   Shoulder flexion 5 4+  Shoulder extension    Shoulder abduction 5 4+  Shoulder adduction    Shoulder internal rotation    Shoulder external rotation 5 5  Middle trapezius    Lower trapezius    Elbow flexion 5 5  Elbow extension    Wrist flexion    Wrist extension    Wrist ulnar deviation    Wrist radial deviation    Wrist pronation    Wrist supination    Grip strength (lbs)    (Blank rows = not tested)  SHOULDER SPECIAL TESTS:   JOINT MOBILITY TESTING:    PALPATION:      TODAY'S TREATMENT:  DATE:   05/21/2024 Therapeutic Exercise:   Aerobic: UBE L1 x 4; fwd and bwd   Supine:  shoulder ER /stick/90 deg x 15;  review for ER butterfly;   flexion/stick x 15;  Prom bil shoulders for flexion and ER;  Prone:   S/L:  ER 2 x 10 bil  2 lb;  Seated:  Standing:   doorway stretch x 3 min ;  Flexion/arom x 15 bil Stretches:  Neuromuscular Re-education: Manual Therapy:  Therapeutic Activity:  Self Care:    Previous: Therapeutic Exercise: Aerobic: UBE L1 x 4; fwd and bwd  Supine:  shoulder flexion, abd, ER prom,  Er butterfly rom x 15;  Seated:  Standing:  Row BlueTB 2x10;  shoulder external rotation GTB 2x10 (attention to back posture) ;  bicep curl 6 lb  x15;  hammer curl 6 lb GTB x15 Stretches:  Neuromuscular Re-education: Manual Therapy: ghj mobs post and inf, LAD on R;  Therapeutic Activity: progression for rom/strength for reaching Wall push ups 2 x 10;  Shoulder pulleys flexion, abd x 2 min each Arom/flexion,  abd, 2 x 10   Self Care:    PATIENT EDUCATION:  Education details: updated and reviewed HEP Person educated: Patient Education method: Explanation, Demonstration, Tactile cues, Verbal cues, and Handouts Education comprehension: verbalized understanding, returned demonstration, verbal cues required, tactile cues required, and needs further education   HOME EXERCISE PROGRAM: Access Code: [email or web address]   ASSESSMENT:  CLINICAL IMPRESSION: Pt has been seen for 11 visits. He is progressing very well. He has rom now WNL, but still has mild tightness and soreness at end range for full flexion as well as ER. ER more sore at 90 deg vs 45 deg. He is doing well with functional elevation. L shoulder increasingly sore lately, awaiting final MRI results. He is doing well functionally but still with mild soreness. Pt to benefit from likely 1 more appt, to finalize Hep and ensure low pain levels. He will f/u again in 2 weeks, possible d/c at that time.   Eval:  Patient presents with deficits following R shoulder surgery on 6/16 for bicep tenodesis and  collagen patch for rotator cuff tear. Pt with healing incisions, dressed today. Pt with good tolerance for initial movement and prom. Instructed on post op precautions , sling wear and rom limitations to follow at this time. Pt with decreased ability for full functional activities, reaching, lifting, carrying, and IADLs. He has inability for functional use of ue at this tim. Pt to benefit from skilled PT to improve deficits and pain. He will return for 1 more visit, then is going out of state for 1 month.    OBJECTIVE IMPAIRMENTS: decreased activity tolerance, decreased knowledge of use of DME, decreased mobility, decreased ROM, decreased strength, increased muscle spasms, impaired flexibility, impaired UE functional use, and pain.   ACTIVITY LIMITATIONS: carrying, lifting, bathing, toileting, dressing, self feeding, reach over head, hygiene/grooming, and locomotion level  PARTICIPATION LIMITATIONS: meal prep, cleaning, laundry, driving, shopping, community activity, and yard work  PERSONAL FACTORS: 1 comorbidity: none are also affecting patient's functional outcome.   REHAB POTENTIAL: Good  CLINICAL DECISION MAKING: Stable/uncomplicated  EVALUATION COMPLEXITY: Low  GOALS: Goals reviewed with patient? Yes   SHORT TERM GOALS: Target date: 03/08/2024   Pt to be independent with initial HEP  Goal status: MET  2.  Pt to understand post op guidelines for next 4 weeks while is out of state.   Goal status: MET    LONG TERM GOALS:  Target date: 06/18/2024  Pt to be independent with final HEP  Goal status: In progress  2.  Pt to demo improved AROM to be [facility] and pain free, to improve ability for ADLs.   Goal status: MET  3.  Pt to demo improved strength to be at least 4/5, to improve ability for reach, lift, and IADLs.   Goal status: MET  4.  Pt to repots decreased pain to 0-2/10 with activity and use of R UE.   Goal status: In progress    PLAN: PT FREQUENCY: 1-2x/week  PT  DURATION: 12 weeks  PLANNED INTERVENTIONS: Therapeutic exercises, Therapeutic activity, Neuromuscular re-education, Patient/Family education, Self Care, Joint mobilization, Joint manipulation, Stair training, DME instructions, Aquatic Therapy, Dry Needling, Electrical stimulation, Cryotherapy, Moist heat, Taping, Ultrasound, Ionotophoresis 4mg /ml Dexamethasone , Manual therapy,  Vasopneumatic device, Traction, Spinal manipulation, Spinal mobilization,    PLAN FOR NEXT SESSION:    Tinnie Don, PT DPT 11:48 AM  05/21/24

## 2024-05-22 ENCOUNTER — Encounter: Payer: Self-pay | Admitting: Family Medicine

## 2024-05-22 ENCOUNTER — Ambulatory Visit: Payer: Medicare Other | Admitting: Family Medicine

## 2024-05-22 VITALS — BP 114/68 | HR 98 | Temp 97.2°F | Ht 69.0 in | Wt 171.4 lb

## 2024-05-22 DIAGNOSIS — E785 Hyperlipidemia, unspecified: Secondary | ICD-10-CM | POA: Diagnosis not present

## 2024-05-22 DIAGNOSIS — M109 Gout, unspecified: Secondary | ICD-10-CM | POA: Diagnosis not present

## 2024-05-22 DIAGNOSIS — I77810 Thoracic aortic ectasia: Secondary | ICD-10-CM | POA: Diagnosis not present

## 2024-05-22 DIAGNOSIS — Z125 Encounter for screening for malignant neoplasm of prostate: Secondary | ICD-10-CM

## 2024-05-22 DIAGNOSIS — I10 Essential (primary) hypertension: Secondary | ICD-10-CM

## 2024-05-22 DIAGNOSIS — Z23 Encounter for immunization: Secondary | ICD-10-CM | POA: Diagnosis not present

## 2024-05-22 DIAGNOSIS — Z0000 Encounter for general adult medical examination without abnormal findings: Secondary | ICD-10-CM

## 2024-05-22 NOTE — Progress Notes (Signed)
 Phone: [phone]   Subjective:  Patient presents today for their annual VISIT- not physical. Chief complaint-noted.   See problem oriented charting  The following were reviewed and entered/updated in epic: Past Medical History:  Diagnosis Date   Arthritis    Gout    History of kidney stones    Hyperlipidemia    Hypertension    Thoracic aortic aneurysm (HCC) 2022   stable 4cm by CTA 12/2022   Patient Active Problem List   Diagnosis Date Noted   Ascending aorta dilation (HCC) 11/04/2021    Priority: High   Spinal stenosis of lumbar region 09/29/2021    Priority: High   Fatty liver 05/20/2022    Priority: Medium    Essential hypertension 11/04/2021    Priority: Medium    Hyperlipidemia, unspecified 11/04/2021    Priority: Medium    Gout 02/05/2017    Priority: Medium    Biceps rupture, proximal, right, initial encounter 02/19/2024    Priority: 1.   Nontraumatic incomplete tear of right rotator cuff 02/19/2024    Priority: 1.   HNP (herniated nucleus pulposus), lumbar 01/11/2021    Priority: 1.   Acromioclavicular joint pain 07/13/2019    Priority: 1.   Sternum pain 01/16/2019    Priority: 1.   Right anterior shoulder pain 11/08/2017    Priority: 1.   Neck pain 07/04/2017    Priority: 1.   Lumbar spondylosis 01/19/2017    Priority: 1.   Acute bilateral low back pain with bilateral sciatica 01/19/2017    Priority: 1.   Past Surgical History:  Procedure Laterality Date   Anterior Cervical Decompression Fusion  2019   BICEPT TENODESIS Right 02/19/2024   Procedure: SUBPECTORAL BICEPS TENODESIS AND COLLAGEN PATCH AUGMENTATION;  Surgeon: Genelle Standing, MD;  Location: [facility];  Service: Orthopedics;  Laterality: Right;   DISTAL CLAVICLE EXCISION Right 2020   Right Shoulder Distal Clavicle Excision   JOINT REPLACEMENT  2008, 2013   Bilateral hip replacements   LAMINECTOMY  1984   L4-L5   LAMINECTOMY  1996   L3-L4   LUMBAR FUSION  01/11/2021    may 2022- L3-L5   LUMBAR FUSION  09/29/2021   L2-L3   SHOULDER ARTHROSCOPY WITH ROTATOR CUFF REPAIR Right 02/19/2024   Procedure: RIGHT SHOULDER ARTHROSCOPY;  Surgeon: Genelle Standing, MD;  Location: [facility];  Service: Orthopedics;  Laterality: Right;   SPINE SURGERY  2019, 2022, 2023   C4-C6 fusion, L2-L5 fusion   TOTAL HIP ARTHROPLASTY Left    left hip 09/2006   TOTAL HIP ARTHROPLASTY  09/14/2011   Right. Procedure: TOTAL HIP ARTHROPLASTY;  Surgeon: Toribio JULIANNA Chancy, MD;  Location: [facility] OR;  Service: Orthopedics;  Laterality: Right;    Family History  Problem Relation Age of Onset   Diabetes Mother        passed at 11   Skin cancer Father    Gout Father    Hypertension Father        passed at 91   Hyperlipidemia Father    CAD Brother        CABG 26   Cancer Brother    Diabetes Brother    Healthy Brother    Healthy Brother    Alcoholism Brother    Depression Brother        suicide age 53   Healthy Maternal Grandmother        lived to 66   Brain cancer Maternal Grandfather  age 28- was an MD   CAD Paternal Grandmother        died in 75s   CAD Paternal Grandfather        died in 27s   Obesity Sister    Arthritis Sister    Healthy Sister     Medications- reviewed and updated Current Outpatient Medications  Medication Sig Dispense Refill   acetaminophen  (TYLENOL ) 500 MG tablet Take 500 mg by mouth every 6 (six) hours as needed (pain.).     allopurinol  (ZYLOPRIM ) 300 MG tablet TAKE 1 TABLET EVERY MORNING 90 tablet 3   aspirin  EC 325 MG tablet Take 1 tablet (325 mg total) by mouth daily. 14 tablet 0   Cholecalciferol (VITAMIN D -3 PO) Take 2,000 Units by mouth in the morning.     colchicine  0.6 MG tablet Take 1 tablet (0.6 mg total) by mouth daily. At start of flare- take 2 and then an hour later (max 3 day one) if not improved can take 1 more, then daily until gout flare resolves (Patient taking differently: Take 0.6 mg by mouth daily as needed  (gout attacks).) 30 tablet 3   cyclobenzaprine  (FLEXERIL ) 10 MG tablet Take 1 tablet (10 mg total) by mouth 3 (three) times daily as needed for muscle spasms. 30 tablet 0   diclofenac  Sodium (PENNSAID ) 2 % SOLN Apply 1-2 Pump topically 2 (two) times daily as needed (pain.).     losartan  (COZAAR ) 100 MG tablet Take 1 tablet (100 mg total) by mouth daily. 90 tablet 3   meloxicam  (MOBIC ) 15 MG tablet Take 1 tablet (15 mg total) by mouth daily. 30 tablet 0   Multiple Vitamin (MULTIVITAMIN WITH MINERALS) TABS tablet Take 1 tablet by mouth in the morning.     simvastatin  (ZOCOR ) 40 MG tablet TAKE 1 TABLET DAILY 90 tablet 3   No current facility-administered medications for this visit.    Allergies-reviewed and updated No Known Allergies  Social History   Social History Narrative   Married 28 years in 2023. Daughter 78, son 22 in 2023.    From [place]       Retired from [employer]- enjoying retirement      Hobbies: traveling, hiking, walks daily, live music   Objective  Objective:  BP 114/68   Pulse 98   Temp (!) 97.2 F (36.2 C) (Temporal)   Ht 5' 9 (1.753 m)   Wt 171 lb 6.4 oz (77.7 kg)   SpO2 97%   BMI 25.31 kg/m  Gen: NAD, resting comfortably HEENT: Mucous membranes are moist. Oropharynx normal Neck: no thyromegaly CV: RRR no murmurs rubs or gallops Lungs: CTAB no crackles, wheeze, rhonchi Abdomen: soft/nontender/nondistended/normal bowel sounds. No rebound or guarding.  Ext: no edema Skin: warm, dry Neuro: grossly normal, moves all extremities, PERRLA   Assessment and Plan   #Gout S: Medication: Allopurinol  300 mg-uric acid typically below 6- no recent flares . Avoids triggers- even cautious with shrimp As needed:  colchicine  and indomethacin  have helped  Lab Results  Component Value Date   LABURIC 4.4 05/24/2022  A/P:with no flares  hold off on checking uric acid- continue current medications   #hypertension S: medication: Losartan  100 mg Home readings #s:  115-120/75-80 BP Readings from Last 3 Encounters:  05/22/24 114/68  02/19/24 126/89  11/21/23 130/80  A/P: well controlled continue current medications    #hyperlipidemia-CT calcium scoring of 0 in 2022 # Aortic atherosclerosis S: Medication:Simvastatin  40 mg without myalgias Lab Results  Component Value Date  CHOL 197 05/20/2024   HDL 58 05/20/2024   LDLCALC 116 (H) 05/20/2024   TRIG 123 05/20/2024   CHOLHDL 3.4 05/20/2024   A/P: Hyperlipidemia-prefers to maintain current dose- may try to reduce peanut butter Aortic atherosclerosis - LDL above goal but with reassuring CT calcium we want to hold steady   # ascending aorta dilation S:Ascending aorta noted to be dilated at 43 mm on ct cardiac scoring but only 41 mm on echocardiogram** - plans to do imaging with Dr. Pietro on yearly basis  - most recently 4.2 cm   A/P: overall stable- continue current medications and continue blood pressure control   # Prediabetes-A1c 5.7 in 2024 and 2025. Tried to reduce chips/carbohydrates but no change on a1c. Doing almonds instead Lab Results  Component Value Date   HGBA1C 5.7 (H) 05/20/2024   HGBA1C 5.7 05/22/2023    # Orthopedic issues - Has worked with Dr. Genelle in 2025 most recently and required right shoulder arthroscopy and subpectoral biceps tenodesis after biceps rupture.  Just had shoulder MRI yesterday  on left side with some ongoing concern for rotator cuff tendinitis as well as impingement at biceps tendon. Has had injection on that side -History of lumbar spondylosis-history of 2 back surgeries in 1984 and 1996 with laminectomies as well as more recent surgery 2023 with fusion of L2-L3 and L1-L2 fusion around the turn of 2025 -Gabapentin  and cyclobenzaprine  have been helpful in the past but in 2024 reported gabapentin  not helpful and was off- sparing flexeril  right now at night  - neck good lately after prior left radiculopathy- prior injections and physical therapy  # Health  maintenance - Weight management including diet and exercise (fatty liver previously noted)-- around 170-175 on home scales. Doing great job with exercise daily including hiking if not at the gym. Intentional with his orthopedic issues -Alcohol intake 4 to 6/week  -Very sparing marijuana through [employer] for pain relief  -Cancer screening 1. Colon cancer screening- 01/06/2022 with [facility] and plan for 5-year follow-up 2. Lung Cancer screening- not a candidate-never smoker  3. Skin cancer screening- yearly with dermatology associates Dr. Tricia   4. Prostate cancer screening-baseline PSA around 1.2 per his report prior to seeing us  Lab Results  Component Value Date   PSA 1.79 05/20/2024   PSA 1.23 05/22/2023   PSA 1.19 05/24/2022  5. Immunizations today- had flu shot  Recommended follow up: Return in about 1 year (around 05/22/2025) for followup or sooner if needed.Schedule b4 you leave. Future Appointments  Date Time Provider Department Center  06/04/2024  8:00 AM Dow Maxwell, PT [facility] None  06/14/2024  9:15 AM Genelle Standing, MD [facility] [id number]  08/05/2024  3:00 PM [facility] ANNUAL WELLNESS VISIT 1 [facility]   Lab/Order associations: Already completed labs   ICD-10-CM   1. Need for influenza vaccination  Z23 Flu vaccine HIGH DOSE PF(Fluzone Trivalent)    2. Preventative health care  Z00.00     3. Essential hypertension  I10     4. Hyperlipidemia, unspecified hyperlipidemia type  E78.5     5. Gout, unspecified cause, unspecified chronicity, unspecified site  M10.9     6. Ascending aorta dilation (HCC)  I77.810     7. Screening for prostate cancer  Z12.5       No orders of the defined types were placed in this encounter.   Return precautions advised.  [name], MD

## 2024-05-22 NOTE — Patient Instructions (Addendum)
 Schedule lab visit in 2 months with no sex or vigorous exercisef for 2 days before  Otherwise glad you are doing so well other than structural issues!   Recommended follow up: Return in about 1 year (around 05/22/2025) for followup or sooner if needed.Schedule b4 you leave.

## 2024-06-04 ENCOUNTER — Encounter: Payer: Self-pay | Admitting: Physical Therapy

## 2024-06-04 ENCOUNTER — Ambulatory Visit (INDEPENDENT_AMBULATORY_CARE_PROVIDER_SITE_OTHER): Admitting: Physical Therapy

## 2024-06-04 DIAGNOSIS — M25511 Pain in right shoulder: Secondary | ICD-10-CM | POA: Diagnosis not present

## 2024-06-04 DIAGNOSIS — M6281 Muscle weakness (generalized): Secondary | ICD-10-CM

## 2024-06-04 NOTE — Therapy (Signed)
 OUTPATIENT PHYSICAL THERAPY UPPER EXTREMITY TREATMENT/D/C   Patient Name: Kyle Lawson MRN: [id number] DOB:Sep 03, 1956, 68 y.o., male Today's Date: 06/04/2024    END OF SESSION:  PT End of Session - 06/04/24 0759     Visit Number 12    Number of Visits 24    Date for Recertification  06/18/24    Authorization Type Medicare    PT Start Time 0800    PT Stop Time 0840    PT Time Calculation (min) 40 min    Activity Tolerance Patient tolerated treatment well    Behavior During Therapy [facility] for tasks assessed/performed              Past Medical History:  Diagnosis Date   Arthritis    Gout    History of kidney stones    Hyperlipidemia    Hypertension    Thoracic aortic aneurysm 2022   stable 4cm by CTA 12/2022   Past Surgical History:  Procedure Laterality Date   Anterior Cervical Decompression Fusion  2019   BICEPT TENODESIS Right 02/19/2024   Procedure: SUBPECTORAL BICEPS TENODESIS AND COLLAGEN PATCH AUGMENTATION;  Surgeon: Genelle Standing, MD;  Location: [facility];  Service: Orthopedics;  Laterality: Right;   DISTAL CLAVICLE EXCISION Right 2020   Right Shoulder Distal Clavicle Excision   JOINT REPLACEMENT  2008, 2013   Bilateral hip replacements   LAMINECTOMY  1984   L4-L5   LAMINECTOMY  1996   L3-L4   LUMBAR FUSION  01/11/2021   may 2022- L3-L5   LUMBAR FUSION  09/29/2021   L2-L3   SHOULDER ARTHROSCOPY WITH ROTATOR CUFF REPAIR Right 02/19/2024   Procedure: RIGHT SHOULDER ARTHROSCOPY;  Surgeon: Genelle Standing, MD;  Location: [facility];  Service: Orthopedics;  Laterality: Right;   SPINE SURGERY  2019, 2022, 2023   C4-C6 fusion, L2-L5 fusion   TOTAL HIP ARTHROPLASTY Left    left hip 09/2006   TOTAL HIP ARTHROPLASTY  09/14/2011   Right. Procedure: TOTAL HIP ARTHROPLASTY;  Surgeon: Toribio JULIANNA Chancy, MD;  Location: [facility];  Service: Orthopedics;  Laterality: Right;   Patient Active Problem List   Diagnosis Date Noted   Biceps  rupture, proximal, right, initial encounter 02/19/2024   Nontraumatic incomplete tear of right rotator cuff 02/19/2024   Fatty liver 05/20/2022   Essential hypertension 11/04/2021   Hyperlipidemia, unspecified 11/04/2021   Ascending aorta dilation 11/04/2021   Spinal stenosis of lumbar region 09/29/2021   HNP (herniated nucleus pulposus), lumbar 01/11/2021   Acromioclavicular joint pain 07/13/2019   Sternum pain 01/16/2019   Right anterior shoulder pain 11/08/2017   Neck pain 07/04/2017   Gout 02/05/2017   Lumbar spondylosis 01/19/2017   Acute bilateral low back pain with bilateral sciatica 01/19/2017    PCP: [name]  REFERRING PROVIDER: Garnette Genelle   REFERRING DIAG: s/p R bicep tenodesis and collagen augmentation   THERAPY DIAG:  Acute pain of right shoulder  Muscle weakness (generalized)  Rationale for Evaluation and Treatment: Rehabilitation  ONSET DATE: 02/19/24  SUBJECTIVE:  SUBJECTIVE STATEMENT:  06/04/2024 Pt states R shoulder doing well. L has been quite sore from time to time. He did get results of MRI for L, no tear seen.   6/16 DOS: SUBPECTORAL BICEPS TENODESIS AND COLLAGEN PATCH AUGMENTATION;  Doing well since surgery, eating well, trying to sleep, has been propped up in bed with pillow under arm. Wearing sling today. He re-dressed his incisions yesterday to shower. Pt is retired, but very active. He has been able to resume walking in the neighborhood.   Hand dominance: Right  PERTINENT HISTORY: HTN, Thoracic aortic aneurysm, Bil THA, Lumbar: 2 previous laminectomy and 2 fusions L2-5. Cervical ACDF/ fusion C4-6;  R shoulder DCE 2020;   PAIN:  Are you having pain? Yes: NPRS scale: 1-2/10 Pain location: R shoulder  Pain description: sore Aggravating factors: none  stated  Relieving factors: none stated   PRECAUTIONS: See protocol for surgical precautions   RED FLAGS: None   WEIGHT BEARING RESTRICTIONS: Yes, limited for R UE  FALLS:  Has patient fallen in last 6 months? No  PLOF: Independent  PATIENT GOALS: Decreased pain in R shoulder/arm, improved use and reaching.   NEXT MD VISIT:   OBJECTIVE: updated 9/9   DIAGNOSTIC FINDINGS:    PATIENT SURVEYS :    COGNITION: Overall cognitive status: Within functional limits for tasks assessed     SENSATION: WFL  POSTURE:   UPPER EXTREMITY ROM:   Active  ROM Right eval Left eval Right  04/02/24 Right 05/14/24  Shoulder flexion Prom: up to 85 deg today, (bent elbow)  A: 140   Right: :140 Left:   140  Shoulder extension      Shoulder abduction   A: 140   Shoulder adduction      Shoulder internal rotation      Shoulder external rotation      Elbow flexion WFL  wfl   Elbow extension Not tested to full extension  wfl   Wrist flexion      Wrist extension      Wrist ulnar deviation      Wrist radial deviation      Wrist pronation      Wrist supination      (Blank rows = not tested)  UPPER EXTREMITY MMT:  MMT Right 05/14/24 Left 05/14/24   Shoulder flexion 5 4+  Shoulder extension    Shoulder abduction 5 4+  Shoulder adduction    Shoulder internal rotation    Shoulder external rotation 5 5  Middle trapezius    Lower trapezius    Elbow flexion 5 5  Elbow extension    Wrist flexion    Wrist extension    Wrist ulnar deviation    Wrist radial deviation    Wrist pronation    Wrist supination    Grip strength (lbs)    (Blank rows = not tested)  SHOULDER SPECIAL TESTS:   JOINT MOBILITY TESTING:    PALPATION:      TODAY'S TREATMENT:  DATE:   06/04/2024 Therapeutic Exercise:   Aerobic: UBE L1 x 4; fwd and bwd  Supine:  review for  ER butterfly;   Prom bil shoulders for flexion and ER;  Prone:   S/L:  ER 2 x 10 bil  2 lb, then 3 lb ;  Seated:  Standing:  Flexion/arom x 15 bil Stretches:  Neuromuscular Re-education: Manual Therapy:  Therapeutic Activity:  Rows Blue TB x 20 Chest press Blue TB x 20 Flys blue TB x 20  Reviewed weight bearing, high plank on knees position - no pain.  Lat pull down [place] x 15;  Self Care:    Previous: Therapeutic Exercise:   Aerobic: UBE L1 x 4; fwd and bwd  Supine:  shoulder ER /stick/90 deg x 15;  review for ER butterfly;   flexion/stick x 15;  Prom bil shoulders for flexion and ER;  Prone:   S/L:  ER 2 x 10 bil  2 lb;  Seated:  Standing:   doorway stretch x 3 min ;  Flexion/arom x 15 bil Stretches:  Neuromuscular Re-education: Manual Therapy:  Therapeutic Activity:  Self Care:     Therapeutic Exercise: Aerobic: UBE L1 x 4; fwd and bwd  Supine:  shoulder flexion, abd, ER prom,  Er butterfly rom x 15;  Seated:  Standing:  Row BlueTB 2x10;  shoulder external rotation GTB 2x10 (attention to back posture) ;  bicep curl 6 lb  x15;  hammer curl 6 lb GTB x15 Stretches:  Neuromuscular Re-education: Manual Therapy: ghj mobs post and inf, LAD on R;  Therapeutic Activity: progression for rom/strength for reaching Wall push ups 2 x 10;  Shoulder pulleys flexion, abd x 2 min each Arom/flexion,  abd, 2 x 10   Self Care:    PATIENT EDUCATION:  Education details: updated and reviewed HEP Person educated: Patient Education method: Explanation, Demonstration, Tactile cues, Verbal cues, and Handouts Education comprehension: verbalized understanding, returned demonstration, verbal cues required, tactile cues required, and needs further education   HOME EXERCISE PROGRAM: Access Code: [email or web address]   ASSESSMENT:  CLINICAL IMPRESSION: Pt doing very well at this time. R shoulder has made good recovery, and he has met all goals at this time. L shoulder still with  variable soreness, he will f/u with MD in next couple weeks for this. Reviewed best ther ex to continue for L and R shoudlers. Pt ready for d/c to HEP, pt in agreement with plan.   Eval:  Patient presents with deficits following R shoulder surgery on 6/16 for bicep tenodesis and collagen patch for rotator cuff tear. Pt with healing incisions, dressed today. Pt with good tolerance for initial movement and prom. Instructed on post op precautions , sling wear and rom limitations to follow at this time. Pt with decreased ability for full functional activities, reaching, lifting, carrying, and IADLs. He has inability for functional use of ue at this tim. Pt to benefit from skilled PT to improve deficits and pain. He will return for 1 more visit, then is going out of state for 1 month.    OBJECTIVE IMPAIRMENTS: decreased activity tolerance, decreased knowledge of use of DME, decreased mobility, decreased ROM, decreased strength, increased muscle spasms, impaired flexibility, impaired UE functional use, and pain.   ACTIVITY LIMITATIONS: carrying, lifting, bathing, toileting, dressing, self feeding, reach over head, hygiene/grooming, and locomotion level  PARTICIPATION LIMITATIONS: meal prep, cleaning, laundry, driving, shopping, community activity, and yard work  PERSONAL FACTORS: 1 comorbidity: none are also affecting patient's functional  outcome.   REHAB POTENTIAL: Good  CLINICAL DECISION MAKING: Stable/uncomplicated  EVALUATION COMPLEXITY: Low  GOALS: Goals reviewed with patient? Yes   SHORT TERM GOALS: Target date: 03/08/2024   Pt to be independent with initial HEP  Goal status: MET  2.  Pt to understand post op guidelines for next 4 weeks while is out of state.   Goal status: MET    LONG TERM GOALS: Target date: 06/18/2024  Pt to be independent with final HEP  Goal status: In progress  2.  Pt to demo improved AROM to be [facility] and pain free, to improve ability for ADLs.   Goal  status: MET  3.  Pt to demo improved strength to be at least 4/5, to improve ability for reach, lift, and IADLs.   Goal status: MET  4.  Pt to repots decreased pain to 0-2/10 with activity and use of R UE.   Goal status: IMET    PLAN: PT FREQUENCY: 1-2x/week  PT DURATION: 12 weeks  PLANNED INTERVENTIONS: Therapeutic exercises, Therapeutic activity, Neuromuscular re-education, Patient/Family education, Self Care, Joint mobilization, Joint manipulation, Stair training, DME instructions, Aquatic Therapy, Dry Needling, Electrical stimulation, Cryotherapy, Moist heat, Taping, Ultrasound, Ionotophoresis 4mg /ml Dexamethasone , Manual therapy,  Vasopneumatic device, Traction, Spinal manipulation, Spinal mobilization,    PLAN FOR NEXT SESSION:    Tinnie Don, PT DPT 7:59 AM  06/04/24  PHYSICAL THERAPY DISCHARGE SUMMARY  Visits from Start of Care: 12  Plan: Patient agrees to discharge.  Patient goals were met. Patient is being discharged due to meeting the stated rehab goals.      Tinnie Don, PT, DPT 12:12 PM  06/04/24

## 2024-06-14 ENCOUNTER — Other Ambulatory Visit (HOSPITAL_BASED_OUTPATIENT_CLINIC_OR_DEPARTMENT_OTHER): Payer: Self-pay

## 2024-06-14 ENCOUNTER — Ambulatory Visit (INDEPENDENT_AMBULATORY_CARE_PROVIDER_SITE_OTHER): Admitting: Orthopaedic Surgery

## 2024-06-14 DIAGNOSIS — G8929 Other chronic pain: Secondary | ICD-10-CM | POA: Diagnosis not present

## 2024-06-14 DIAGNOSIS — M25512 Pain in left shoulder: Secondary | ICD-10-CM | POA: Diagnosis not present

## 2024-06-14 MED ORDER — CELECOXIB 200 MG PO CAPS
200.0000 mg | ORAL_CAPSULE | Freq: Two times a day (BID) | ORAL | 3 refills | Status: AC
  Filled 2024-06-14: qty 60, 30d supply, fill #0
  Filled 2024-07-23: qty 60, 30d supply, fill #1
  Filled 2024-08-24: qty 60, 30d supply, fill #2
  Filled 2024-10-03 (×2): qty 60, 30d supply, fill #3

## 2024-06-14 NOTE — Progress Notes (Signed)
 Post Operative Evaluation    Procedure/Date of Surgery: Right shoulder arthroscopy with collagen patch augmentation and biceps tenodesis subpectoral 6/16   Interval History:    Presents today status post above procedure.  Presents today for MRI of his left shoulder.  He is experiencing more symptoms now only on the left compared to the right   PMH/PSH/Family History/Social History/Meds/Allergies:    Past Medical History:  Diagnosis Date   Arthritis    Gout    History of kidney stones    Hyperlipidemia    Hypertension    Thoracic aortic aneurysm 2022   stable 4cm by CTA 12/2022   Past Surgical History:  Procedure Laterality Date   Anterior Cervical Decompression Fusion  2019   BICEPT TENODESIS Right 02/19/2024   Procedure: SUBPECTORAL BICEPS TENODESIS AND COLLAGEN PATCH AUGMENTATION;  Surgeon: Genelle Standing, MD;  Location: [facility];  Service: Orthopedics;  Laterality: Right;   DISTAL CLAVICLE EXCISION Right 2020   Right Shoulder Distal Clavicle Excision   JOINT REPLACEMENT  2008, 2013   Bilateral hip replacements   LAMINECTOMY  1984   L4-L5   LAMINECTOMY  1996   L3-L4   LUMBAR FUSION  01/11/2021   may 2022- L3-L5   LUMBAR FUSION  09/29/2021   L2-L3   SHOULDER ARTHROSCOPY WITH ROTATOR CUFF REPAIR Right 02/19/2024   Procedure: RIGHT SHOULDER ARTHROSCOPY;  Surgeon: Genelle Standing, MD;  Location: [facility];  Service: Orthopedics;  Laterality: Right;   SPINE SURGERY  2019, 2022, 2023   C4-C6 fusion, L2-L5 fusion   TOTAL HIP ARTHROPLASTY Left    left hip 09/2006   TOTAL HIP ARTHROPLASTY  09/14/2011   Right. Procedure: TOTAL HIP ARTHROPLASTY;  Surgeon: Toribio JULIANNA Chancy, MD;  Location: [facility];  Service: Orthopedics;  Laterality: Right;   Social History   Socioeconomic History   Marital status: Married    Spouse name: Not on file   Number of children: 2   Years of education: Not on file   Highest  education level: Not on file  Occupational History   Not on file  Tobacco Use   Smoking status: Never   Smokeless tobacco: Never  Vaping Use   Vaping status: Never Used  Substance and Sexual Activity   Alcohol use: Yes    Alcohol/week: 4.0 - 5.0 standard drinks of alcohol    Types: 4 - 5 Cans of beer per week    Comment: social   Drug use: Yes    Types: Marijuana    Comment: for pain- very occasionally   Sexual activity: Yes    Partners: Female  Other Topics Concern   Not on file  Social History Narrative   Married 28 years in 2023. Daughter 17, son 44 in 2023.    From [place]       Retired from [employer]- enjoying retirement      Hobbies: traveling, hiking, walks daily, live music   Social Drivers of Health   Financial Resource Strain: Low Risk  (07/26/2023)   Overall Financial Resource Strain (CARDIA)    Difficulty of Paying Living Expenses: Not hard at all  Food Insecurity: No Food Insecurity (07/26/2023)   Hunger Vital Sign    Worried About Running Out of Food in the Last Year: Never true    Ran Out of  Food in the Last Year: Never true  Transportation Needs: No Transportation Needs (07/26/2023)   PRAPARE - Administrator, Civil Service (Medical): No    Lack of Transportation (Non-Medical): No  Physical Activity: Sufficiently Active (07/26/2023)   Exercise Vital Sign    Days of Exercise per Week: 5 days    Minutes of Exercise per Session: 90 min  Stress: No Stress Concern Present (07/26/2023)   [employer] of Occupational Health - Occupational Stress Questionnaire    Feeling of Stress : Not at all  Social Connections: Moderately Isolated (07/26/2023)   Social Connection and Isolation Panel    Frequency of Communication with Friends and Family: More than three times a week    Frequency of Social Gatherings with Friends and Family: More than three times a week    Attends Religious Services: Never    Database administrator or Organizations: No     Attends Engineer, structural: Never    Marital Status: Married   Family History  Problem Relation Age of Onset   Diabetes Mother        passed at 14   Skin cancer Father    Gout Father    Hypertension Father        passed at 17   Hyperlipidemia Father    CAD Brother        CABG 39   Cancer Brother    Diabetes Brother    Healthy Brother    Healthy Brother    Alcoholism Brother    Depression Brother        suicide age 3   Healthy Maternal Grandmother        lived to 46   Brain cancer Maternal Grandfather        age 23- was an MD   CAD Paternal Grandmother        died in 34s   CAD Paternal Grandfather        died in 78s   Obesity Sister    Arthritis Sister    Healthy Sister    No Known Allergies Current Outpatient Medications  Medication Sig Dispense Refill   celecoxib (CELEBREX) 200 MG capsule Take 1 capsule (200 mg total) by mouth 2 (two) times daily. 60 capsule 3   acetaminophen  (TYLENOL ) 500 MG tablet Take 500 mg by mouth every 6 (six) hours as needed (pain.).     allopurinol  (ZYLOPRIM ) 300 MG tablet TAKE 1 TABLET EVERY MORNING 90 tablet 3   aspirin  EC 325 MG tablet Take 1 tablet (325 mg total) by mouth daily. 14 tablet 0   Cholecalciferol (VITAMIN D -3 PO) Take 2,000 Units by mouth in the morning.     colchicine  0.6 MG tablet Take 1 tablet (0.6 mg total) by mouth daily. At start of flare- take 2 and then an hour later (max 3 day one) if not improved can take 1 more, then daily until gout flare resolves (Patient taking differently: Take 0.6 mg by mouth daily as needed (gout attacks).) 30 tablet 3   cyclobenzaprine  (FLEXERIL ) 10 MG tablet Take 1 tablet (10 mg total) by mouth 3 (three) times daily as needed for muscle spasms. 30 tablet 0   diclofenac  Sodium (PENNSAID ) 2 % SOLN Apply 1-2 Pump topically 2 (two) times daily as needed (pain.).     losartan  (COZAAR ) 100 MG tablet Take 1 tablet (100 mg total) by mouth daily. 90 tablet 3   meloxicam  (MOBIC ) 15 MG tablet  Take 1 tablet (  15 mg total) by mouth daily. 30 tablet 0   Multiple Vitamin (MULTIVITAMIN WITH MINERALS) TABS tablet Take 1 tablet by mouth in the morning.     simvastatin  (ZOCOR ) 40 MG tablet TAKE 1 TABLET DAILY 90 tablet 3   No current facility-administered medications for this visit.   No results found.  Review of Systems:   A ROS was performed including pertinent positives and negatives as documented in the HPI.   Musculoskeletal Exam:    There were no vitals taken for this visit.  Right shoulder incisions are well-appearing without erythema or drainage.  Active forward elevation is 165 degrees.  External rotation at the side is to 45 internal rotation is to L1.  Left shoulder with tenderness about the anterior lateral aspect of the shoulder with forward elevation to 165 degrees external rotation is to 45 and internal rotation is to L1.  Pain with Neer impingement Imaging:    MRI left shoulder: Biceps appears to be intact.  There is evidence of anterior impingement with tendinitis involving the anterior rotator cuff/rotator cable  I personally reviewed and interpreted the radiographs.   Assessment:   status post right shoulder arthroscopy and subpectoral biceps tenodesis overall doing extremely well.  I did describe that his left shoulder does appear to be evidence of anterior shoulder impingement.  At this time given his history of rupture I would like to defer additional injections instead deferring to physical therapy for strengthening.  I did give him some advice about topical application.  I will plan to see him back as needed Plan :    - Return to clinic as needed      I personally saw and evaluated the patient, and participated in the management and treatment plan.  Elspeth Parker, MD Attending Physician, Orthopedic Surgery  This document was dictated using Dragon voice recognition software. A reasonable attempt at proof reading has been made to minimize errors.

## 2024-06-21 ENCOUNTER — Encounter (HOSPITAL_BASED_OUTPATIENT_CLINIC_OR_DEPARTMENT_OTHER): Payer: Self-pay | Admitting: Orthopaedic Surgery

## 2024-06-21 ENCOUNTER — Ambulatory Visit: Admitting: Family Medicine

## 2024-07-04 ENCOUNTER — Other Ambulatory Visit: Payer: Self-pay | Admitting: Medical Genetics

## 2024-07-04 DIAGNOSIS — Z006 Encounter for examination for normal comparison and control in clinical research program: Secondary | ICD-10-CM

## 2024-07-08 ENCOUNTER — Encounter: Payer: Self-pay | Admitting: Radiology

## 2024-07-16 ENCOUNTER — Telehealth: Payer: Self-pay | Admitting: Family Medicine

## 2024-07-16 NOTE — Telephone Encounter (Signed)
 [facility] at [facility] faxed Surgical Clearance, to be filled out by provider. Patient requested to send it back via Fax within ASAP. Document is located in providers tray at front office.Please advise at [phone].

## 2024-07-16 NOTE — Telephone Encounter (Signed)
Placed in provider basket for review.

## 2024-07-22 ENCOUNTER — Encounter: Payer: Self-pay | Admitting: Family Medicine

## 2024-07-23 ENCOUNTER — Encounter (HOSPITAL_BASED_OUTPATIENT_CLINIC_OR_DEPARTMENT_OTHER): Payer: Self-pay | Admitting: Orthopaedic Surgery

## 2024-07-23 ENCOUNTER — Other Ambulatory Visit (INDEPENDENT_AMBULATORY_CARE_PROVIDER_SITE_OTHER)

## 2024-07-23 ENCOUNTER — Other Ambulatory Visit: Payer: Self-pay

## 2024-07-23 ENCOUNTER — Ambulatory Visit: Payer: Self-pay | Admitting: Family Medicine

## 2024-07-23 DIAGNOSIS — Z125 Encounter for screening for malignant neoplasm of prostate: Secondary | ICD-10-CM

## 2024-07-23 LAB — PSA, MEDICARE: PSA: 1.84 ng/mL (ref 0.10–4.00)

## 2024-07-23 NOTE — Progress Notes (Signed)
   07/23/24 1650  PAT Phone Screen  Is the patient taking a GLP-1 receptor agonist? No  Do You Have Diabetes? No  Do You Have Hypertension? Yes  Have You Ever Been to the ER for Asthma? No  Have You Taken Oral Steroids in the Past 3 Months? No  Do you Take Phenteramine or any Other Diet Drugs? No  Recent  Lab Work, EKG, CXR? Yes  Where was this test performed? 09-01-23 EKG  Do you have a history of heart problems? (S)  Yes (04-24-21 ECHO EF 55-60%, aortic dilation 41mm, 12-2023 CT coronary aorta 42mm)  Cardiologist Name Dr Pietro  Have you ever had tests on your heart? Yes  What cardiac tests were performed? Echo  Results viewable: [facility] Media Tab  Any Recent Hospitalizations? No  Height 5' 9 (1.753 m)  Weight 79.4 kg  Pat Appointment Scheduled No  Reason for No Appointment Not Needed

## 2024-07-24 ENCOUNTER — Ambulatory Visit (HOSPITAL_BASED_OUTPATIENT_CLINIC_OR_DEPARTMENT_OTHER): Payer: Self-pay | Admitting: Orthopaedic Surgery

## 2024-07-24 ENCOUNTER — Other Ambulatory Visit (HOSPITAL_BASED_OUTPATIENT_CLINIC_OR_DEPARTMENT_OTHER): Payer: Self-pay

## 2024-07-24 ENCOUNTER — Ambulatory Visit (INDEPENDENT_AMBULATORY_CARE_PROVIDER_SITE_OTHER): Admitting: Orthopaedic Surgery

## 2024-07-24 DIAGNOSIS — M7542 Impingement syndrome of left shoulder: Secondary | ICD-10-CM

## 2024-07-24 MED ORDER — OXYCODONE HCL 5 MG PO TABS
5.0000 mg | ORAL_TABLET | ORAL | 0 refills | Status: AC | PRN
  Filled 2024-07-24: qty 25, 5d supply, fill #0

## 2024-07-24 MED ORDER — ASPIRIN 325 MG PO TBEC
325.0000 mg | DELAYED_RELEASE_TABLET | Freq: Every day | ORAL | 0 refills | Status: AC
  Filled 2024-07-24: qty 14, 14d supply, fill #0

## 2024-07-24 MED ORDER — IBUPROFEN 800 MG PO TABS
800.0000 mg | ORAL_TABLET | Freq: Three times a day (TID) | ORAL | 0 refills | Status: AC
  Filled 2024-07-24: qty 30, 10d supply, fill #0

## 2024-07-24 MED ORDER — ACETAMINOPHEN 500 MG PO TABS
500.0000 mg | ORAL_TABLET | Freq: Three times a day (TID) | ORAL | 0 refills | Status: DC
  Filled 2024-07-24: qty 30, 10d supply, fill #0

## 2024-07-24 NOTE — Addendum Note (Signed)
 Addended by: WOLFGANG CONLEY HERO on: 07/24/2024 12:27 PM   Modules accepted: Orders

## 2024-07-24 NOTE — Progress Notes (Signed)
 Post Operative Evaluation    Procedure/Date of Surgery: Right shoulder arthroscopy with collagen patch augmentation and biceps tenodesis subpectoral 6/16   Interval History:    Presents today status post above procedure.  Overall he is here today for discussion of the left shoulder.  Unfortunately he has been experiencing significant pain on the side more recently   PMH/PSH/Family History/Social History/Meds/Allergies:    Past Medical History:  Diagnosis Date   Arthritis    Gout    History of kidney stones    Hyperlipidemia    Hypertension    Thoracic aortic aneurysm 2022   stable 4cm by CTA 12/2022   Past Surgical History:  Procedure Laterality Date   Anterior Cervical Decompression Fusion  2019   BICEPT TENODESIS Right 02/19/2024   Procedure: SUBPECTORAL BICEPS TENODESIS AND COLLAGEN PATCH AUGMENTATION;  Surgeon: Genelle Standing, MD;  Location: [facility];  Service: Orthopedics;  Laterality: Right;   DISTAL CLAVICLE EXCISION Right 2020   Right Shoulder Distal Clavicle Excision   JOINT REPLACEMENT  2008, 2013   Bilateral hip replacements   LAMINECTOMY  1984   L4-L5   LAMINECTOMY  1996   L3-L4   LUMBAR FUSION  01/11/2021   may 2022- L3-L5   LUMBAR FUSION  09/29/2021   L2-L3   SHOULDER ARTHROSCOPY WITH ROTATOR CUFF REPAIR Right 02/19/2024   Procedure: RIGHT SHOULDER ARTHROSCOPY;  Surgeon: Genelle Standing, MD;  Location: [facility];  Service: Orthopedics;  Laterality: Right;   SPINE SURGERY  2019, 2022, 2023   C4-C6 fusion, L2-L5 fusion   TOTAL HIP ARTHROPLASTY Left    left hip 09/2006   TOTAL HIP ARTHROPLASTY  09/14/2011   Right. Procedure: TOTAL HIP ARTHROPLASTY;  Surgeon: Toribio JULIANNA Chancy, MD;  Location: [facility] OR;  Service: Orthopedics;  Laterality: Right;   Social History   Socioeconomic History   Marital status: Married    Spouse name: Not on file   Number of children: 2   Years of education: Not on  file   Highest education level: Not on file  Occupational History   Not on file  Tobacco Use   Smoking status: Never   Smokeless tobacco: Never  Vaping Use   Vaping status: Never Used  Substance and Sexual Activity   Alcohol use: Yes    Alcohol/week: 4.0 - 5.0 standard drinks of alcohol    Types: 4 - 5 Cans of beer per week    Comment: social   Drug use: Yes    Types: Marijuana    Comment: for pain- very occasionally   Sexual activity: Yes    Partners: Female  Other Topics Concern   Not on file  Social History Narrative   Married 28 years in 2023. Daughter 26, son 45 in 2023.    From [place]       Retired from [employer]- enjoying retirement      Hobbies: traveling, hiking, walks daily, live music   Social Drivers of Health   Financial Resource Strain: Low Risk  (07/26/2023)   Overall Financial Resource Strain (CARDIA)    Difficulty of Paying Living Expenses: Not hard at all  Food Insecurity: No Food Insecurity (07/26/2023)   Hunger Vital Sign    Worried About Running Out of Food in the Last Year: Never true    Ran Out  of Food in the Last Year: Never true  Transportation Needs: No Transportation Needs (07/26/2023)   PRAPARE - Administrator, Civil Service (Medical): No    Lack of Transportation (Non-Medical): No  Physical Activity: Sufficiently Active (07/26/2023)   Exercise Vital Sign    Days of Exercise per Week: 5 days    Minutes of Exercise per Session: 90 min  Stress: No Stress Concern Present (07/26/2023)   [employer] of Occupational Health - Occupational Stress Questionnaire    Feeling of Stress : Not at all  Social Connections: Moderately Isolated (07/26/2023)   Social Connection and Isolation Panel    Frequency of Communication with Friends and Family: More than three times a week    Frequency of Social Gatherings with Friends and Family: More than three times a week    Attends Religious Services: Never    Database Administrator or  Organizations: No    Attends Engineer, Structural: Never    Marital Status: Married   Family History  Problem Relation Age of Onset   Diabetes Mother        passed at 27   Skin cancer Father    Gout Father    Hypertension Father        passed at 60   Hyperlipidemia Father    CAD Brother        CABG 59   Cancer Brother    Diabetes Brother    Healthy Brother    Healthy Brother    Alcoholism Brother    Depression Brother        suicide age 29   Healthy Maternal Grandmother        lived to 69   Brain cancer Maternal Grandfather        age 33- was an MD   CAD Paternal Grandmother        died in 23s   CAD Paternal Grandfather        died in 35s   Obesity Sister    Arthritis Sister    Healthy Sister    No Known Allergies Current Outpatient Medications  Medication Sig Dispense Refill   acetaminophen  (TYLENOL ) 500 MG tablet Take 1 tablet (500 mg total) by mouth every 8 (eight) hours for 10 days. 30 tablet 0   aspirin  EC 325 MG tablet Take 1 tablet (325 mg total) by mouth daily. 14 tablet 0   ibuprofen  (ADVIL ) 800 MG tablet Take 1 tablet (800 mg total) by mouth every 8 (eight) hours for 10 days. Please take with food, please alternate with acetaminophen  30 tablet 0   oxyCODONE  (ROXICODONE ) 5 MG immediate release tablet Take 1 tablet (5 mg total) by mouth every 4 (four) hours as needed for severe pain (pain score 7-10) or breakthrough pain. 25 tablet 0   acetaminophen  (TYLENOL ) 500 MG tablet Take 500 mg by mouth every 6 (six) hours as needed (pain.).     allopurinol  (ZYLOPRIM ) 300 MG tablet TAKE 1 TABLET EVERY MORNING 90 tablet 3   aspirin  EC 325 MG tablet Take 1 tablet (325 mg total) by mouth daily. 14 tablet 0   celecoxib (CELEBREX) 200 MG capsule Take 1 capsule (200 mg total) by mouth 2 (two) times daily. 60 capsule 3   Cholecalciferol (VITAMIN D -3 PO) Take 2,000 Units by mouth in the morning.     colchicine  0.6 MG tablet Take 1 tablet (0.6 mg total) by mouth daily. At  start of flare- take 2 and then  an hour later (max 3 day one) if not improved can take 1 more, then daily until gout flare resolves (Patient taking differently: Take 0.6 mg by mouth daily as needed (gout attacks).) 30 tablet 3   cyclobenzaprine  (FLEXERIL ) 10 MG tablet Take 1 tablet (10 mg total) by mouth 3 (three) times daily as needed for muscle spasms. 30 tablet 0   diclofenac  Sodium (PENNSAID ) 2 % SOLN Apply 1-2 Pump topically 2 (two) times daily as needed (pain.).     losartan  (COZAAR ) 100 MG tablet Take 1 tablet (100 mg total) by mouth daily. 90 tablet 3   Multiple Vitamin (MULTIVITAMIN WITH MINERALS) TABS tablet Take 1 tablet by mouth in the morning.     simvastatin  (ZOCOR ) 40 MG tablet TAKE 1 TABLET DAILY 90 tablet 3   No current facility-administered medications for this visit.   No results found.  Review of Systems:   A ROS was performed including pertinent positives and negatives as documented in the HPI.   Musculoskeletal Exam:    There were no vitals taken for this visit.  Right shoulder incisions are well-appearing without erythema or drainage.  Active forward elevation is 165 degrees.  External rotation at the side is to 45 internal rotation is to L1.  Left shoulder with tenderness about the anterior lateral aspect of the shoulder with forward elevation to 165 degrees external rotation is to 45 and internal rotation is to L1.  Pain with Neer impingement Imaging:    MRI left shoulder: Biceps appears to be intact.  There is evidence of anterior impingement with tendinitis involving the anterior rotator cuff/rotator cable  I personally reviewed and interpreted the radiographs.   Assessment:   status post right shoulder arthroscopy and subpectoral biceps tenodesis overall doing extremely well.  I did describe that his left shoulder does appear to be evidence of anterior shoulder impingement.  At this time given his history of rupture I would like to defer additional  injections instead deferring to physical therapy for strengthening.  Unfortunately he has been having more recent significant pain and as a result we did discuss surgical treatment.  I do ultimately believe he would be a candidate for left shoulder arthroscopy with biceps tenodesis as well as collagen patch augmentation.  I did discuss risk limitation as well as associated recovery.  He would like to proceed Plan :    - Plan for left shoulder arthroscopy with biceps tenodesis and collagen patch augmentation   After a lengthy discussion of treatment options, including risks, benefits, alternatives, complications of surgical and nonsurgical conservative options, the patient elected surgical repair.   The patient  is aware of the material risks  and complications including, but not limited to injury to adjacent structures, neurovascular injury, infection, numbness, bleeding, implant failure, thermal burns, stiffness, persistent pain, failure to heal, disease transmission from allograft, need for further surgery, dislocation, anesthetic risks, blood clots, risks of death,and others. The probabilities of surgical success and failure discussed with patient given their particular co-morbidities.The time and nature of expected rehabilitation and recovery was discussed.The patient's questions were all answered preoperatively.  No barriers to understanding were noted. I explained the natural history of the disease process and Rx rationale.  I explained to the patient what I considered to be reasonable expectations given their personal situation.  The final treatment plan was arrived at through a shared patient decision making process model.       I personally saw and evaluated the patient, and participated in  the management and treatment plan.  Elspeth Parker, MD Attending Physician, Orthopedic Surgery  This document was dictated using Dragon voice recognition software. A reasonable attempt at proof  reading has been made to minimize errors.

## 2024-07-29 NOTE — Anesthesia Preprocedure Evaluation (Signed)
 Anesthesia Evaluation  Patient identified by MRN, date of birth, ID band Patient awake    Reviewed: Allergy & Precautions, NPO status , Patient's Chart, lab work & pertinent test results  History of Anesthesia Complications Negative for: history of anesthetic complications  Airway Mallampati: II  TM Distance: >3 FB Neck ROM: Full   Comment: Previous grade I view with MAC 4, easy mask Dental  (+) Dental Advisory Given   Pulmonary neg pulmonary ROS   Pulmonary exam normal breath sounds clear to auscultation       Cardiovascular hypertension (losartan ), Pt. on medications (-) angina (-) CAD (CAC 0 on 12/21/2020), (-) Past MI, (-) Cardiac Stents and (-) CABG (-) dysrhythmias  Rhythm:Regular Rate:Normal  HLD, thoracic aortic aneurysm  TTE 04/08/2021: IMPRESSIONS    1. Left ventricular ejection fraction, by estimation, is 55 to 60%. Left  ventricular ejection fraction by 3D volume is 56 %. The left ventricle has  normal function. The left ventricle has no regional wall motion  abnormalities. Left ventricular diastolic   parameters are consistent with Grade I diastolic dysfunction (impaired  relaxation). The average left ventricular global longitudinal strain is  -23.0 %. The global longitudinal strain is normal.   2. Right ventricular systolic function is normal. The right ventricular  size is normal. There is normal pulmonary artery systolic pressure.   3. The mitral valve is normal in structure. No evidence of mitral valve  regurgitation. No evidence of mitral stenosis.   4. The aortic valve is tricuspid. Aortic valve regurgitation is trivial.  No aortic stenosis is present.   5. Aortic dilatation noted. There is mild dilatation of the ascending  aorta, measuring 41 mm.   6. The inferior vena cava is normal in size with greater than 50%  respiratory variability, suggesting right atrial pressure of 3 mmHg.     Neuro/Psych neg  Seizures  Neuromuscular disease (bilateral low back pain with sciatica, lumbar spondylosis)    GI/Hepatic negative GI ROS,,,(+)     substance abuse  marijuana useFatty liver   Endo/Other  negative endocrine ROS    Renal/GU negative Renal ROS     Musculoskeletal  (+) Arthritis ,    Abdominal   Peds  Hematology negative hematology ROS (+) Lab Results      Component                Value               Date                      WBC                      7.8                 05/20/2024                HGB                      13.1                05/20/2024                HCT                      39.2                05/20/2024  MCV                      94.9                05/20/2024                PLT                      280.0               05/20/2024              Anesthesia Other Findings   Reproductive/Obstetrics                              Anesthesia Physical Anesthesia Plan  ASA: 2  Anesthesia Plan: General   Post-op Pain Management: Regional block* and Tylenol  PO (pre-op)*   Induction: Intravenous  PONV Risk Score and Plan: 2 and Ondansetron , Dexamethasone , Midazolam  and Treatment may vary due to age or medical condition  Airway Management Planned: Oral ETT  Additional Equipment:   Intra-op Plan:   Post-operative Plan: Extubation in OR  Informed Consent: I have reviewed the patients History and Physical, chart, labs and discussed the procedure including the risks, benefits and alternatives for the proposed anesthesia with the patient or authorized representative who has indicated his/her understanding and acceptance.     Dental advisory given  Plan Discussed with: CRNA and Anesthesiologist  Anesthesia Plan Comments: (Discussed potential risks of nerve blocks including, but not limited to, infection, bleeding, nerve damage, seizures, pneumothorax, respiratory depression, and potential failure of the block. Alternatives  to nerve blocks discussed. All questions answered.  Risks of general anesthesia discussed including, but not limited to, sore throat, hoarse voice, chipped/damaged teeth, injury to vocal cords, nausea and vomiting, allergic reactions, lung infection, heart attack, stroke, and death. All questions answered. )         Anesthesia Quick Evaluation

## 2024-07-30 ENCOUNTER — Encounter (HOSPITAL_BASED_OUTPATIENT_CLINIC_OR_DEPARTMENT_OTHER)
Admission: RE | Disposition: A | Discharge status: Home / Self Care | Payer: Self-pay | Source: Home / Self Care | Attending: Orthopaedic Surgery

## 2024-07-30 ENCOUNTER — Encounter (HOSPITAL_BASED_OUTPATIENT_CLINIC_OR_DEPARTMENT_OTHER): Payer: Self-pay | Admitting: Orthopaedic Surgery

## 2024-07-30 ENCOUNTER — Ambulatory Visit (HOSPITAL_BASED_OUTPATIENT_CLINIC_OR_DEPARTMENT_OTHER)
Admission: RE | Admit: 2024-07-30 | Discharge: 2024-07-30 | Disposition: A | Discharge status: Home / Self Care | Attending: Orthopaedic Surgery | Admitting: Orthopaedic Surgery

## 2024-07-30 ENCOUNTER — Encounter (HOSPITAL_BASED_OUTPATIENT_CLINIC_OR_DEPARTMENT_OTHER): Payer: Self-pay | Admitting: Anesthesiology

## 2024-07-30 ENCOUNTER — Other Ambulatory Visit: Payer: Self-pay

## 2024-07-30 ENCOUNTER — Ambulatory Visit (HOSPITAL_BASED_OUTPATIENT_CLINIC_OR_DEPARTMENT_OTHER): Payer: Self-pay | Admitting: Anesthesiology

## 2024-07-30 DIAGNOSIS — M25812 Other specified joint disorders, left shoulder: Secondary | ICD-10-CM | POA: Insufficient documentation

## 2024-07-30 DIAGNOSIS — E785 Hyperlipidemia, unspecified: Secondary | ICD-10-CM | POA: Insufficient documentation

## 2024-07-30 DIAGNOSIS — M7522 Bicipital tendinitis, left shoulder: Secondary | ICD-10-CM

## 2024-07-30 DIAGNOSIS — M75102 Unspecified rotator cuff tear or rupture of left shoulder, not specified as traumatic: Secondary | ICD-10-CM | POA: Diagnosis not present

## 2024-07-30 DIAGNOSIS — Z79899 Other long term (current) drug therapy: Secondary | ICD-10-CM | POA: Diagnosis not present

## 2024-07-30 DIAGNOSIS — M7542 Impingement syndrome of left shoulder: Secondary | ICD-10-CM

## 2024-07-30 DIAGNOSIS — I10 Essential (primary) hypertension: Secondary | ICD-10-CM | POA: Diagnosis not present

## 2024-07-30 DIAGNOSIS — Z01818 Encounter for other preprocedural examination: Secondary | ICD-10-CM

## 2024-07-30 DIAGNOSIS — M75112 Incomplete rotator cuff tear or rupture of left shoulder, not specified as traumatic: Secondary | ICD-10-CM | POA: Diagnosis not present

## 2024-07-30 HISTORY — PX: SHOULDER ARTHROSCOPY WITH ROTATOR CUFF REPAIR: SHX5685

## 2024-07-30 SURGERY — ARTHROSCOPY, SHOULDER, WITH ROTATOR CUFF REPAIR
Anesthesia: General | Site: Shoulder | Laterality: Left

## 2024-07-30 MED ORDER — LIDOCAINE HCL (CARDIAC) PF 100 MG/5ML IV SOSY
PREFILLED_SYRINGE | INTRAVENOUS | Status: DC | PRN
  Administered 2024-07-30: 100 mg via INTRAVENOUS

## 2024-07-30 MED ORDER — LIDOCAINE 2% (20 MG/ML) 5 ML SYRINGE
INTRAMUSCULAR | Status: AC
Start: 2024-07-30 — End: 2024-07-30
  Filled 2024-07-30: qty 5

## 2024-07-30 MED ORDER — PROPOFOL 500 MG/50ML IV EMUL
INTRAVENOUS | Status: AC
  Filled 2024-07-30: qty 50

## 2024-07-30 MED ORDER — SUGAMMADEX SODIUM 200 MG/2ML IV SOLN
INTRAVENOUS | Status: DC | PRN
  Administered 2024-07-30: 200 mg via INTRAVENOUS

## 2024-07-30 MED ORDER — ONDANSETRON HCL 4 MG/2ML IJ SOLN
INTRAMUSCULAR | Status: AC
  Filled 2024-07-30: qty 2

## 2024-07-30 MED ORDER — MIDAZOLAM HCL 2 MG/2ML IJ SOLN
INTRAMUSCULAR | Status: AC
  Filled 2024-07-30: qty 2

## 2024-07-30 MED ORDER — PROPOFOL 10 MG/ML IV BOLUS
INTRAVENOUS | Status: DC | PRN
  Administered 2024-07-30: 150 mg via INTRAVENOUS

## 2024-07-30 MED ORDER — ROCURONIUM BROMIDE 100 MG/10ML IV SOLN
INTRAVENOUS | Status: DC | PRN
  Administered 2024-07-30: 60 mg via INTRAVENOUS

## 2024-07-30 MED ORDER — PHENYLEPHRINE 80 MCG/ML (10ML) SYRINGE FOR IV PUSH (FOR BLOOD PRESSURE SUPPORT)
PREFILLED_SYRINGE | INTRAVENOUS | Status: AC
Start: 2024-07-30 — End: 2024-07-30
  Filled 2024-07-30: qty 10

## 2024-07-30 MED ORDER — CEFAZOLIN SODIUM-DEXTROSE 2-4 GM/100ML-% IV SOLN
2.0000 g | INTRAVENOUS | Status: AC
  Administered 2024-07-30: 2 g via INTRAVENOUS

## 2024-07-30 MED ORDER — BUPIVACAINE HCL (PF) 0.5 % IJ SOLN
INTRAMUSCULAR | Status: DC | PRN
  Administered 2024-07-30: 10 mL via PERINEURAL

## 2024-07-30 MED ORDER — EPHEDRINE SULFATE (PRESSORS) 25 MG/5ML IV SOSY
PREFILLED_SYRINGE | INTRAVENOUS | Status: DC | PRN
  Administered 2024-07-30 (×2): 10 mg via INTRAVENOUS
  Administered 2024-07-30: 5 mg via INTRAVENOUS

## 2024-07-30 MED ORDER — OXYCODONE HCL 5 MG/5ML PO SOLN: 5.0000 mg | Freq: Once | ORAL | Status: DC | PRN

## 2024-07-30 MED ORDER — FENTANYL CITRATE (PF) 100 MCG/2ML IJ SOLN
INTRAMUSCULAR | Status: DC | PRN
  Administered 2024-07-30 (×2): 50 ug via INTRAVENOUS

## 2024-07-30 MED ORDER — GABAPENTIN 300 MG PO CAPS
ORAL_CAPSULE | ORAL | Status: AC
Start: 2024-07-30 — End: 2024-07-30
  Filled 2024-07-30: qty 1

## 2024-07-30 MED ORDER — FENTANYL CITRATE (PF) 100 MCG/2ML IJ SOLN: 25.0000 ug | INTRAMUSCULAR | Status: DC | PRN

## 2024-07-30 MED ORDER — GLYCOPYRROLATE PF 0.2 MG/ML IJ SOSY
PREFILLED_SYRINGE | INTRAMUSCULAR | Status: AC
Start: 2024-07-30 — End: 2024-07-30
  Filled 2024-07-30: qty 1

## 2024-07-30 MED ORDER — FENTANYL CITRATE (PF) 100 MCG/2ML IJ SOLN
INTRAMUSCULAR | Status: AC
  Filled 2024-07-30: qty 2

## 2024-07-30 MED ORDER — TRANEXAMIC ACID-NACL 1000-0.7 MG/100ML-% IV SOLN
1000.0000 mg | INTRAVENOUS | Status: AC
  Administered 2024-07-30: 1000 mg via INTRAVENOUS

## 2024-07-30 MED ORDER — BUPIVACAINE HCL (PF) 0.25 % IJ SOLN
INTRAMUSCULAR | Status: AC
Start: 2024-07-30 — End: 2024-07-30
  Filled 2024-07-30: qty 60

## 2024-07-30 MED ORDER — GABAPENTIN 300 MG PO CAPS
300.0000 mg | ORAL_CAPSULE | Freq: Once | ORAL | Status: AC
  Administered 2024-07-30: 300 mg via ORAL

## 2024-07-30 MED ORDER — GLYCOPYRROLATE 0.2 MG/ML IJ SOLN
INTRAMUSCULAR | Status: DC | PRN
  Administered 2024-07-30: .2 mg via INTRAVENOUS

## 2024-07-30 MED ORDER — BUPIVACAINE LIPOSOME 1.3 % IJ SUSP
INTRAMUSCULAR | Status: DC | PRN
  Administered 2024-07-30: 10 mL via PERINEURAL

## 2024-07-30 MED ORDER — ACETAMINOPHEN 500 MG PO TABS
ORAL_TABLET | ORAL | Status: AC
Start: 2024-07-30 — End: 2024-07-30
  Filled 2024-07-30: qty 2

## 2024-07-30 MED ORDER — MIDAZOLAM HCL (PF) 2 MG/2ML IJ SOLN
2.0000 mg | Freq: Once | INTRAMUSCULAR | Status: AC
  Administered 2024-07-30: 2 mg via INTRAVENOUS

## 2024-07-30 MED ORDER — ONDANSETRON HCL 4 MG/2ML IJ SOLN
INTRAMUSCULAR | Status: DC | PRN
  Administered 2024-07-30: 4 mg via INTRAVENOUS

## 2024-07-30 MED ORDER — EPHEDRINE 5 MG/ML INJ
INTRAVENOUS | Status: AC
  Filled 2024-07-30: qty 5

## 2024-07-30 MED ORDER — MIDAZOLAM HCL 5 MG/5ML IJ SOLN
INTRAMUSCULAR | Status: DC | PRN
  Administered 2024-07-30: 2 mg via INTRAVENOUS

## 2024-07-30 MED ORDER — SODIUM CHLORIDE 0.9 % IR SOLN
Status: DC | PRN
  Administered 2024-07-30 (×2): 1
  Administered 2024-07-30: 1000 mL
  Administered 2024-07-30 (×2): 1

## 2024-07-30 MED ORDER — CEFAZOLIN SODIUM-DEXTROSE 2-4 GM/100ML-% IV SOLN
INTRAVENOUS | Status: AC
  Filled 2024-07-30: qty 100

## 2024-07-30 MED ORDER — PHENYLEPHRINE HCL (PRESSORS) 10 MG/ML IV SOLN
INTRAVENOUS | Status: DC | PRN
  Administered 2024-07-30 (×2): 80 ug via INTRAVENOUS
  Administered 2024-07-30: 240 ug via INTRAVENOUS
  Administered 2024-07-30: 80 ug via INTRAVENOUS

## 2024-07-30 MED ORDER — LACTATED RINGERS IV SOLN: INTRAVENOUS | Status: DC

## 2024-07-30 MED ORDER — ACETAMINOPHEN 500 MG PO TABS
1000.0000 mg | ORAL_TABLET | Freq: Once | ORAL | Status: AC
  Administered 2024-07-30: 1000 mg via ORAL

## 2024-07-30 MED ORDER — OXYCODONE HCL 5 MG PO TABS: 5.0000 mg | ORAL_TABLET | Freq: Once | ORAL | Status: DC | PRN

## 2024-07-30 MED ORDER — DEXAMETHASONE SODIUM PHOSPHATE 4 MG/ML IJ SOLN
INTRAMUSCULAR | Status: DC | PRN
  Administered 2024-07-30: 5 mg via INTRAVENOUS

## 2024-07-30 MED ORDER — PHENYLEPHRINE HCL-NACL 20-0.9 MG/250ML-% IV SOLN
INTRAVENOUS | Status: DC | PRN
  Administered 2024-07-30: 40 ug/min via INTRAVENOUS

## 2024-07-30 MED ORDER — TRANEXAMIC ACID-NACL 1000-0.7 MG/100ML-% IV SOLN
INTRAVENOUS | Status: AC
Start: 2024-07-30 — End: 2024-07-30
  Filled 2024-07-30: qty 100

## 2024-07-30 MED ORDER — METHYLPREDNISOLONE ACETATE 40 MG/ML IJ SUSP
INTRAMUSCULAR | Status: AC
  Filled 2024-07-30: qty 1

## 2024-07-30 MED ORDER — AMISULPRIDE (ANTIEMETIC) 5 MG/2ML IV SOLN: 10.0000 mg | Freq: Once | INTRAVENOUS | Status: DC | PRN

## 2024-07-30 SURGICAL SUPPLY — 53 items
ANCHOR DUAL INSERTER (Anchor) IMPLANT
ANCHOR QUATTRO LINK KNTLS 2.9 (Anchor) IMPLANT
BLADE EXCALIBUR 4.0X13 (MISCELLANEOUS) ×2 IMPLANT
BURR OVAL 8 FLU 4.0X13 (MISCELLANEOUS) ×2 IMPLANT
CANNULA 5.75X71 LONG (CANNULA) IMPLANT
CANNULA PASSPORT 5 (CANNULA) IMPLANT
CANNULA PASSPORT BUTTON 10-40 (CANNULA) IMPLANT
CANNULA TWIST IN 8.25X7CM (CANNULA) IMPLANT
CHLORAPREP W/TINT 26 (MISCELLANEOUS) ×4 IMPLANT
COOLER ICEMAN CLASSIC (MISCELLANEOUS) ×2 IMPLANT
DRAPE IMP U-DRAPE 54X76 (DRAPES) ×2 IMPLANT
DRAPE INCISE IOBAN 66X45 STRL (DRAPES) ×2 IMPLANT
DRAPE SHOULDER BEACH CHAIR (DRAPES) ×2 IMPLANT
DRAPE U-SHAPE 47X51 STRL (DRAPES) ×4 IMPLANT
DW OUTFLOW CASSETTE/TUBE SET (MISCELLANEOUS) ×2 IMPLANT
GAUZE PAD ABD 8X10 STRL (GAUZE/BANDAGES/DRESSINGS) ×2 IMPLANT
GAUZE SPONGE 4X4 12PLY STRL (GAUZE/BANDAGES/DRESSINGS) ×2 IMPLANT
GAUZE XEROFORM 1X8 LF (GAUZE/BANDAGES/DRESSINGS) ×2 IMPLANT
GLOVE BIO SURGEON STRL SZ 6 (GLOVE) ×2 IMPLANT
GLOVE BIO SURGEON STRL SZ7.5 (GLOVE) ×2 IMPLANT
GLOVE BIOGEL PI IND STRL 6.5 (GLOVE) ×2 IMPLANT
GLOVE BIOGEL PI IND STRL 8 (GLOVE) ×2 IMPLANT
GOWN STRL REUS W/ TWL LRG LVL3 (GOWN DISPOSABLE) ×4 IMPLANT
GOWN STRL REUS W/ TWL XL LVL3 (GOWN DISPOSABLE) ×2 IMPLANT
IMPL TAPESTRY BIOINTEGR 30X30 (Orthopedic Implant) IMPLANT
INTRODUCER SURGICAL GRASPING (INSTRUMENTS) IMPLANT
KIT STABILIZATION SHOULDER (MISCELLANEOUS) ×2 IMPLANT
KIT STR SPEAR 1.8 FBRTK DISP (KITS) IMPLANT
LASSO 90 CVE QUICKPAS (DISPOSABLE) IMPLANT
LASSO CRESCENT QUICKPASS (SUTURE) IMPLANT
MANIFOLD NEPTUNE II (INSTRUMENTS) ×2 IMPLANT
NDL HD SCORPION MEGA LOADER (NEEDLE) IMPLANT
NDL SUT PASSER RTC (NEEDLE) IMPLANT
PACK ARTHROSCOPY DSU (CUSTOM PROCEDURE TRAY) ×2 IMPLANT
PACK BASIN DAY SURGERY FS (CUSTOM PROCEDURE TRAY) ×2 IMPLANT
PAD COLD SHLDR WRAP-ON (PAD) ×2 IMPLANT
RESTRAINT HEAD UNIVERSAL NS (MISCELLANEOUS) ×2 IMPLANT
SHEET MEDIUM DRAPE 40X70 STRL (DRAPES) ×2 IMPLANT
SLED ARTHRO TAP HALF PIPE CANN (INSTRUMENTS) IMPLANT
SLEEVE SCD COMPRESS KNEE MED (STOCKING) ×2 IMPLANT
SUT BROADBAND TAPE 2PK 1.5 (SUTURE) IMPLANT
SUT ETHILON 3 0 PS 1 (SUTURE) ×2 IMPLANT
SUT PDS AB 1 CT 36 (SUTURE) IMPLANT
SUT TIGER TAPE 7 IN WHITE (SUTURE) IMPLANT
SUT VIC AB 0 CT2 27 (SUTURE) IMPLANT
SUTURE FIBERWR #2 38 T-5 BLUE (SUTURE) IMPLANT
SUTURE TAPE 1.3 40 TPR END (SUTURE) IMPLANT
SUTURE TAPE TIGERLINK 1.3MM BL (SUTURE) IMPLANT
TAPE FIBER 2MM 7IN #2 BLUE (SUTURE) IMPLANT
TOWEL GREEN STERILE FF (TOWEL DISPOSABLE) ×4 IMPLANT
TUBE CONNECTING 20X1/4 (TUBING) ×2 IMPLANT
TUBING ARTHROSCOPY IRRIG 16FT (MISCELLANEOUS) ×2 IMPLANT
WAND ABLATOR APOLLO I90 (BUR) ×2 IMPLANT

## 2024-07-30 NOTE — Transfer of Care (Signed)
 Immediate Anesthesia Transfer of Care Note  Patient: Kyle Lawson  Procedure(s) Performed: ARTHROSCOPY, SHOULDER, WITH ROTATOR CUFF REPAIR (Left)  Patient Location: PACU  Anesthesia Type:General  Level of Consciousness: awake and alert   Airway & Oxygen Therapy: Patient Spontanous Breathing and Patient connected to face mask oxygen  Post-op Assessment: Report given to RN and Post -op Vital signs reviewed and stable  Post vital signs: Reviewed and stable  Last Vitals:  Vitals Value Taken Time  BP 134/80 07/30/24 09:00  Temp 36.3 C 07/30/24 09:00  Pulse 80 07/30/24 09:05  Resp 21 07/30/24 09:05  SpO2 99 % 07/30/24 09:05  Vitals shown include unfiled device data.  Last Pain:  Vitals:   07/30/24 0651  TempSrc: Temporal  PainSc: 2          Complications: No notable events documented.

## 2024-07-30 NOTE — Progress Notes (Signed)
Assisted Dr. Jennifer Allan with left, interscalene , ultrasound guided block. Side rails up, monitors on throughout procedure. See vital signs in flow sheet. Tolerated Procedure well. 

## 2024-07-30 NOTE — Anesthesia Procedure Notes (Signed)
 Procedure Name: Intubation Date/Time: 07/30/2024 7:38 AM  Performed by: Pam Macario BROCKS, CRNAPre-anesthesia Checklist: Patient identified, Emergency Drugs available, Suction available, Patient being monitored and Timeout performed Patient Re-evaluated:Patient Re-evaluated prior to induction Oxygen Delivery Method: Circle system utilized Preoxygenation: Pre-oxygenation with 100% oxygen Induction Type: IV induction Ventilation: Mask ventilation without difficulty Laryngoscope Size: Mac and 4 Grade View: Grade II Tube type: Oral Tube size: 7.5 mm Number of attempts: 1 Airway Equipment and Method: Stylet and Oral airway Placement Confirmation: ETT inserted through vocal cords under direct vision, positive ETCO2, breath sounds checked- equal and bilateral and CO2 detector Secured at: 23 cm Tube secured with: Tape Dental Injury: Teeth and Oropharynx as per pre-operative assessment

## 2024-07-30 NOTE — Op Note (Signed)
 Date of Surgery: 07/30/2024  INDICATIONS: [name] is a 68 y.o.-year-old male with left shoulder anterior shoulder impingement with biceps tendinitis.  The risk and benefits of the procedure were discussed in detail and documented in the pre-operative evaluation.   PREOPERATIVE DIAGNOSES: Left shoulder, chronic rotator cuff tear, biceps tendinitis, and subacromial impingement.  POSTOPERATIVE DIAGNOSIS: Same.  PROCEDURE: Arthroscopic subacromial decompression - 70173 Arthroscopic rotator cuff repair - 70172 Arthroscopic biceps tenodesis - 70171  SURGEON: Kyle LITTIE Parker MD  ASSISTANT: Conley Funk, ATC  ANESTHESIA:  general  IV FLUIDS AND URINE: See anesthesia record.  ANTIBIOTICS: Ancef   ESTIMATED BLOOD LOSS: 5 mL.  IMPLANTS:  Implant Name Type Inv. Item Serial No. Manufacturer Lot No. LRB No. Used Action  ANCHOR QUATTRO LINK KNTLS 2.9 - [id number] Anchor ANCHOR QUATTRO LINK KNTLS 2.9  [employer](ORTH,TRAU,BIO,SG) [id number] Left 1 Implanted  IMPL TAPESTRY BIOINTEGR 30X30 - [id number] Orthopedic Implant IMPL TAPESTRY BIOINTEGR 30X30  [employer](ORTH,TRAU,BIO,SG) [id number] Left 1 Implanted  ANCHOR DUAL INSERTER - [id number] Anchor ANCHOR DUAL INSERTER  ZIMMER [employer]ON(ORTH,TRAU,BIO,SG) [id number] Left 3 Implanted  ANCHOR DUAL INSERTER - [id number] Anchor ANCHOR DUAL INSERTER  Z[employer]MER [employer]ON(ORTH,TRAU,BIO,SG) [id number] Left 1 Implanted    DRAINS: None  CULTURES: None  COMPLICATIONS: none  PROCEDURE:    OPERATIVE FINDING: Exam under anesthesia:   Examination under anesthesia revealed forward elevation of 150 degrees.  With the arm at the side, there was 65 degrees of external rotation.  There is a 1+ anterior load shift and a 1+ posterior load shift.    Arthroscopic findings demonstrated: Articular space: Rotator interval redness, injection Chondral surfaces: Normal Biceps: Redness, fraying at the insertion of the labral Subscapularis: Intact Supraspinatus: Bursal sided  tearing Infraspinatus: Intact    I identified the patient in the pre-operative holding area.  I marked the operative right shoulder with my initials. I reviewed the risks and benefits of the proposed surgical intervention and the patient wished to proceed.  Anesthesia was then performed with regional block.  The patient was transferred to the operative suite and placed in the beach chair position with all bony prominences padded.     SCDs were placed on bilateral lower extremity. Appropriate antibiotics was administered within 1 hour before incision.  Anesthesia was induced.  The operative extremity was then prepped and draped in standard fashion. A time out was performed confirming the correct extremity, correct patient and correct procedure.   The arthroscope was introduced in the glenohumeral joint from a posterior portal.  An anterior portal was created.  The shoulder was examined and the above findings were noted.     With an arthroscopic shaver and a wand ablator, synovitis throughout the  shoulder was resected.  The arthroscopic shaver was used to excise torn portions of the labrum back to a stable margin. Specifically this was done for the anterior and superior labrum. The rotator interval was debrided using shaver and electrocautery.  At this time the biceps was tagged with a self passing device with a #2 nonabsorbable suture twice.  This was done through the anterior portal.  This was placed in a single 2.9 Quattro link anchor.   Through visualization from intra-articular, the footprint of the rotator cuff was debrided of soft tissues back to bleeding bone.  This was done with an arthroscopic shaver.     The rotator cuff was then approached through the subacromial space. Anterior, lateral, and posterior portals were used.  Bursectomy was performed with an arthroscopic shaver and [employer]Care wand.  The soft tissues and 1 mm of bone on the undersurface of the acromion and clavicle were  resected with the arthroscopic shaver and wand.   Arthroscopic collagen patch augmentation was then performed.  This was introduced via the anterior portal.  Collagen fibers were backed in line with the rotator cuff.  Using the lateral portal 4 staples were then placed in each corner of the patch.   The shoulder was irrigated.  The arthroscopic instruments were removed.  Wounds were closed with 3-0 nylon sutures.  A sterile dressing was applied with xeroform, 4x8s, abdominal pad, and tape. An Rosalita was placed and the upper extremity was placed in a shoulder immobilizer.  The patient tolerated the procedure well and was taken to the recovery room in stable condition.  All counts were correct in the case. The patient tolerated the procedure well and was taken to the recovery room in stable condition.    POSTOPERATIVE PLAN: He will follow the biceps tenodesis protocol.  He will be placed on aspirin  for blood clot prevention.  I will see him back in 2 weeks for suture removal  Kyle LITTIE Parker, MD 9:01 AM

## 2024-07-30 NOTE — Interval H&P Note (Signed)
 History and Physical Interval Note:  07/30/2024 7:00 AM  Kyle Lawson  has presented today for surgery, with the diagnosis of LEFT ANTERIOR SHOULDER IMPINGEMENT.  The various methods of treatment have been discussed with the patient and family. After consideration of risks, benefits and other options for treatment, the patient has consented to  Procedure(s) with comments: ARTHROSCOPY, SHOULDER, WITH ROTATOR CUFF REPAIR (Left) - LEFT SHOULDER ARTHROSCOPY WITH ACROMIOPLASTY, BICEPS TENODESIS AND COLLAGEN PATCH AUGMENTATION as a surgical intervention.  The patient's history has been reviewed, patient examined, no change in status, stable for surgery.  I have reviewed the patient's chart and labs.  Questions were answered to the patient's satisfaction.     Garland Smouse

## 2024-07-30 NOTE — Anesthesia Postprocedure Evaluation (Signed)
 Anesthesia Post Note  Patient: Dionte Blaustein  Procedure(s) Performed: ARTHROSCOPY, SHOULDER, WITH ROTATOR CUFF REPAIR (Left: Shoulder)     Patient location during evaluation: PACU Anesthesia Type: General Level of consciousness: awake Pain management: pain level controlled Vital Signs Assessment: post-procedure vital signs reviewed and stable Respiratory status: spontaneous breathing, nonlabored ventilation and respiratory function stable Cardiovascular status: blood pressure returned to baseline and stable Postop Assessment: no apparent nausea or vomiting Anesthetic complications: no   No notable events documented.  Last Vitals:  Vitals:   07/30/24 0915 07/30/24 0935  BP: 126/89 (!) 133/92  Pulse: 80 84  Resp: (!) 21 18  Temp:  (!) 36.2 C  SpO2: 96% 97%    Last Pain:  Vitals:   07/30/24 0935  TempSrc:   PainSc: 0-No pain                 Delon Aisha Arch

## 2024-07-30 NOTE — Discharge Instructions (Addendum)
 Discharge Instructions    Attending Surgeon: Elspeth Parker, MD Office Phone Number: [phone]   Diagnosis and Procedures:    Surgeries Performed: Left shoulder arthroscopy with biceps tenodesis collagen patch augmentation  Discharge Plan:    Diet: Resume usual diet. Begin with light or bland foods.  Drink plenty of fluids.  Activity:  Keep sling and dressing in place until your follow up visit in Physical Therapy You are advised to go home directly from the hospital or surgical center. Restrict your activities.  GENERAL INSTRUCTIONS: 1.  Keep your surgical site elevated above your heart for at least 5-7 days or longer to prevent swelling. This will improve your comfort and your overall recovery following surgery.     2. Please call Dr. Danetta office at [phone] with questions Monday-Friday during business hours. If no one answers, please leave a message and someone should get back to the patient within 24 hours. For emergencies please call 911 or proceed to the emergency room.   3. Patient to notify surgical team if experiences any of the following: Bowel/Bladder dysfunction, uncontrolled pain, nerve/muscle weakness, incision with increased drainage or redness, nausea/vomiting and Fever greater than 101.0 F.  Be alert for signs of infection including redness, streaking, odor, fever or chills. Be alert for excessive pain or bleeding and notify your surgeon immediately.  WOUND INSTRUCTIONS:   Leave your dressing/cast/splint in place until your post operative visit.  Keep it clean and dry.  Always keep the incision clean and dry until the staples/sutures are removed. If there is no drainage from the incision you should keep it open to air. If there is drainage from the incision you must keep it covered at all times until the drainage stops  Do not soak in a bath tub, hot tub, pool, lake or other body of water until 21 days after your surgery and your incision is  completely dry and healed.  If you have removable sutures (or staples) they must be removed 10-14 days (unless otherwise instructed) from the day of your surgery.     1)  Elevate the extremity as much as possible.  2)  Keep the dressing clean and dry.  3)  Please call us  if the dressing becomes wet or dirty.  4)  If you are experiencing worsening pain or worsening swelling, please call.     MEDICATIONS: Resume all previous home medications at the previous prescribed dose and frequency unless otherwise noted Start taking the  pain medications on an as-needed basis as prescribed  Please taper down pain medication over the next week following surgery.  Ideally you should not require a refill of any narcotic pain medication.  Take pain medication with food to minimize nausea. In addition to the prescribed pain medication, you may take over-the-counter pain relievers such as Tylenol .  Do NOT take additional tylenol  if your pain medication already has tylenol  in it.  Aspirin  325mg  daily per bottle instructions. Narcotic Policy: Per [facility] clinic policy, our goal is ensure optimal postoperative pain control with a multimodal pain management strategy. For all [facility] patients, our goal is to wean post-operative narcotic medications by 6 weeks post-operatively, and many times sooner. If this is not possible due to utilization of pain medication prior to surgery, your [facility] doctor will support your acute post-operative pain control for the first 6 weeks postoperatively, with a plan to transition you back to your primary pain team following that. Maralee will work to ensure a therapist, occupational.  FOLLOWUP INSTRUCTIONS: 1. Follow up at the Physical Therapy Clinic 3-4 days following surgery. This appointment should be scheduled unless other arrangements have been made.The Physical Therapy scheduling number is [phone] if an appointment has not already been arranged.  2. Contact Dr.  Danetta office during office hours at [phone] or the practice after hours line at [phone] for non-emergencies. For medical emergencies call 911.   Discharge Location: Home   Post Anesthesia Home Care Instructions  Activity: Get plenty of rest for the remainder of the day. A responsible individual must stay with you for 24 hours following the procedure.  For the next 24 hours, DO NOT: -Drive a car -Advertising copywriter -Drink alcoholic beverages -Take any medication unless instructed by your physician -Make any legal decisions or sign important papers.  Meals: Start with liquid foods such as gelatin or soup. Progress to regular foods as tolerated. Avoid greasy, spicy, heavy foods. If nausea and/or vomiting occur, drink only clear liquids until the nausea and/or vomiting subsides. Call your physician if vomiting continues.  Special Instructions/Symptoms: Your throat may feel dry or sore from the anesthesia or the breathing tube placed in your throat during surgery. If this causes discomfort, gargle with warm salt water. The discomfort should disappear within 24 hours.  May have Tylenol  after 1pm if needed.      Regional Anesthesia Blocks  1. You may not be able to move or feel the blocked extremity after a regional anesthetic block. This may last may last from 3-48 hours after placement, but it will go away. The length of time depends on the medication injected and your individual response to the medication. As the nerves start to wake up, you may experience tingling as the movement and feeling returns to your extremity. If the numbness and inability to move your extremity has not gone away after 48 hours, please call your surgeon.   2. The extremity that is blocked will need to be protected until the numbness is gone and the strength has returned. Because you cannot feel it, you will need to take extra care to avoid injury. Because it may be weak, you may have difficulty  moving it or using it. You may not know what position it is in without looking at it while the block is in effect.  3. For blocks in the legs and feet, returning to weight bearing and walking needs to be done carefully. You will need to wait until the numbness is entirely gone and the strength has returned. You should be able to move your leg and foot normally before you try and bear weight or walk. You will need someone to be with you when you first try to ensure you do not fall and possibly risk injury.  4. Bruising and tenderness at the needle site are common side effects and will resolve in a few days.  5. Persistent numbness or new problems with movement should be communicated to the surgeon or the [facility] [phone] [facility] [phone]).Information for Discharge Teaching: EXPAREL  (bupivacaine  liposome injectable suspension)   Pain relief is important to your recovery. The goal is to control your pain so you can move easier and return to your normal activities as soon as possible after your procedure. Your physician may use several types of medicines to manage pain, swelling, and more.  Your surgeon or anesthesiologist gave you EXPAREL (bupivacaine ) to help control your pain after surgery.  EXPAREL  is a local anesthetic designed to release  slowly over an extended period of time to provide pain relief by numbing the tissue around the surgical site. EXPAREL  is designed to release pain medication over time and can control pain for up to 72 hours. Depending on how you respond to EXPAREL , you may require less pain medication during your recovery. EXPAREL  can help reduce or eliminate the need for opioids during the first few days after surgery when pain relief is needed the most. EXPAREL  is not an opioid and is not addictive. It does not cause sleepiness or sedation.   Important! A teal colored band has been placed on your arm with the date, time and amount of  EXPAREL  you have received. Please leave this armband in place for the full 96 hours following administration, and then you may remove the band. If you return to the hospital for any reason within 96 hours following the administration of EXPAREL , the armband provides important information that your health care providers to know, and alerts them that you have received this anesthetic.    Possible side effects of EXPAREL : Temporary loss of sensation or ability to move in the area where medication was injected. Nausea, vomiting, constipation Rarely, numbness and tingling in your mouth or lips, lightheadedness, or anxiety may occur. Call your doctor right away if you think you may be experiencing any of these sensations, or if you have other questions regarding possible side effects.  Follow all other discharge instructions given to you by your surgeon or nurse. Eat a healthy diet and drink plenty of water or other fluids.Donjoy Ultrasling III (Red ball):  Please contact your surgeon if you have questions or concerns about your sling.

## 2024-07-30 NOTE — H&P (Signed)
 Post Operative Evaluation      Procedure/Date of Surgery: Right shoulder arthroscopy with collagen patch augmentation and biceps tenodesis subpectoral 6/16    Interval History:      Presents today status post above procedure.  Overall he is here today for discussion of the left shoulder.  Unfortunately he has been experiencing significant pain on the side more recently     PMH/PSH/Family History/Social History/Meds/Allergies:         Past Medical History:  Diagnosis Date   Arthritis     Gout     History of kidney stones     Hyperlipidemia     Hypertension     Thoracic aortic aneurysm 2022    stable 4cm by CTA 12/2022             Past Surgical History:  Procedure Laterality Date   Anterior Cervical Decompression Fusion   2019   BICEPT TENODESIS Right 02/19/2024    Procedure: SUBPECTORAL BICEPS TENODESIS AND COLLAGEN PATCH AUGMENTATION;  Surgeon: Genelle Standing, MD;  Location: [facility];  Service: Orthopedics;  Laterality: Right;   DISTAL CLAVICLE EXCISION Right 2020    Right Shoulder Distal Clavicle Excision   JOINT REPLACEMENT   2008, 2013    Bilateral hip replacements   LAMINECTOMY   1984    L4-L5   LAMINECTOMY   1996    L3-L4   LUMBAR FUSION   01/11/2021    may 2022- L3-L5   LUMBAR FUSION   09/29/2021    L2-L3   SHOULDER ARTHROSCOPY WITH ROTATOR CUFF REPAIR Right 02/19/2024    Procedure: RIGHT SHOULDER ARTHROSCOPY;  Surgeon: Genelle Standing, MD;  Location: [facility];  Service: Orthopedics;  Laterality: Right;   SPINE SURGERY   2019, 2022, 2023    C4-C6 fusion, L2-L5 fusion   TOTAL HIP ARTHROPLASTY Left      left hip 09/2006   TOTAL HIP ARTHROPLASTY   09/14/2011    Right. Procedure: TOTAL HIP ARTHROPLASTY;  Surgeon: Toribio JULIANNA Chancy, MD;  Location: [facility] OR;  Service: Orthopedics;  Laterality: Right;        Social History         Socioeconomic History   Marital status: Married      Spouse name:  Not on file   Number of children: 2   Years of education: Not on file   Highest education level: Not on file  Occupational History   Not on file  Tobacco Use   Smoking status: Never   Smokeless tobacco: Never  Vaping Use   Vaping status: Never Used  Substance and Sexual Activity   Alcohol use: Yes      Alcohol/week: 4.0 - 5.0 standard drinks of alcohol      Types: 4 - 5 Cans of beer per week      Comment: social   Drug use: Yes      Types: Marijuana      Comment: for pain- very occasionally   Sexual activity: Yes      Partners: Female  Other Topics Concern   Not on file  Social History Narrative    Married 28 years in 2023. Daughter 55, son 41 in 2023.     From [place]          Retired from [employer]- enjoying  retirement         Hobbies: traveling, hiking, walks daily, live music    Social Drivers of Health        Financial Resource Strain: Low Risk  (07/26/2023)    Overall Financial Resource Strain (CARDIA)     Difficulty of Paying Living Expenses: Not hard at all  Food Insecurity: No Food Insecurity (07/26/2023)    Hunger Vital Sign     Worried About Running Out of Food in the Last Year: Never true     Ran Out of Food in the Last Year: Never true  Transportation Needs: No Transportation Needs (07/26/2023)    PRAPARE - Therapist, Art (Medical): No     Lack of Transportation (Non-Medical): No  Physical Activity: Sufficiently Active (07/26/2023)    Exercise Vital Sign     Days of Exercise per Week: 5 days     Minutes of Exercise per Session: 90 min  Stress: No Stress Concern Present (07/26/2023)    [employer] of Occupational Health - Occupational Stress Questionnaire     Feeling of Stress : Not at all  Social Connections: Moderately Isolated (07/26/2023)    Social Connection and Isolation Panel     Frequency of Communication with Friends and Family: More than three times a week     Frequency of Social Gatherings with Friends and  Family: More than three times a week     Attends Religious Services: Never     Database Administrator or Organizations: No     Attends Engineer, Structural: Never     Marital Status: Married         Family History  Problem Relation Age of Onset   Diabetes Mother          passed at 79   Skin cancer Father     Gout Father     Hypertension Father          passed at 16   Hyperlipidemia Father     CAD Brother          CABG 87   Cancer Brother     Diabetes Brother     Healthy Brother     Healthy Brother     Alcoholism Brother     Depression Brother          suicide age 80   Healthy Maternal Grandmother          lived to 73   Brain cancer Maternal Grandfather          age 50- was an MD   CAD Paternal Grandmother          died in 43s   CAD Paternal Grandfather          died in 65s   Obesity Sister     Arthritis Sister     Healthy Sister          Allergies  No Known Allergies         Current Outpatient Medications  Medication Sig Dispense Refill   acetaminophen  (TYLENOL ) 500 MG tablet Take 1 tablet (500 mg total) by mouth every 8 (eight) hours for 10 days. 30 tablet 0   aspirin  EC 325 MG tablet Take 1 tablet (325 mg total) by mouth daily. 14 tablet 0   ibuprofen  (ADVIL ) 800 MG tablet Take 1 tablet (800 mg total) by mouth every 8 (eight) hours for 10 days. Please take with food, please alternate with  acetaminophen  30 tablet 0   oxyCODONE  (ROXICODONE ) 5 MG immediate release tablet Take 1 tablet (5 mg total) by mouth every 4 (four) hours as needed for severe pain (pain score 7-10) or breakthrough pain. 25 tablet 0   acetaminophen  (TYLENOL ) 500 MG tablet Take 500 mg by mouth every 6 (six) hours as needed (pain.).       allopurinol  (ZYLOPRIM ) 300 MG tablet TAKE 1 TABLET EVERY MORNING 90 tablet 3   aspirin  EC 325 MG tablet Take 1 tablet (325 mg total) by mouth daily. 14 tablet 0   celecoxib  (CELEBREX ) 200 MG capsule Take 1 capsule (200 mg total) by mouth 2 (two) times  daily. 60 capsule 3   Cholecalciferol (VITAMIN D -3 PO) Take 2,000 Units by mouth in the morning.       colchicine  0.6 MG tablet Take 1 tablet (0.6 mg total) by mouth daily. At start of flare- take 2 and then an hour later (max 3 day one) if not improved can take 1 more, then daily until gout flare resolves (Patient taking differently: Take 0.6 mg by mouth daily as needed (gout attacks).) 30 tablet 3   cyclobenzaprine  (FLEXERIL ) 10 MG tablet Take 1 tablet (10 mg total) by mouth 3 (three) times daily as needed for muscle spasms. 30 tablet 0   diclofenac  Sodium (PENNSAID ) 2 % SOLN Apply 1-2 Pump topically 2 (two) times daily as needed (pain.).       losartan  (COZAAR ) 100 MG tablet Take 1 tablet (100 mg total) by mouth daily. 90 tablet 3   Multiple Vitamin (MULTIVITAMIN WITH MINERALS) TABS tablet Take 1 tablet by mouth in the morning.       simvastatin  (ZOCOR ) 40 MG tablet TAKE 1 TABLET DAILY 90 tablet 3      No current facility-administered medications for this visit.      Imaging Results (Last 48 hours)  No results found.     Review of Systems:   A ROS was performed including pertinent positives and negatives as documented in the HPI.     Musculoskeletal Exam:     There were no vitals taken for this visit.   Right shoulder incisions are well-appearing without erythema or drainage.  Active forward elevation is 165 degrees.  External rotation at the side is to 45 internal rotation is to L1.   Left shoulder with tenderness about the anterior lateral aspect of the shoulder with forward elevation to 165 degrees external rotation is to 45 and internal rotation is to L1.  Pain with Neer impingement Imaging:     MRI left shoulder: Biceps appears to be intact.  There is evidence of anterior impingement with tendinitis involving the anterior rotator cuff/rotator cable   I personally reviewed and interpreted the radiographs.     Assessment:   status post right shoulder arthroscopy and  subpectoral biceps tenodesis overall doing extremely well.  I did describe that his left shoulder does appear to be evidence of anterior shoulder impingement.  At this time given his history of rupture I would like to defer additional injections instead deferring to physical therapy for strengthening.  Unfortunately he has been having more recent significant pain and as a result we did discuss surgical treatment.  I do ultimately believe he would be a candidate for left shoulder arthroscopy with biceps tenodesis as well as collagen patch augmentation.  I did discuss risk limitation as well as associated recovery.  He would like to proceed Plan :     - Plan  for left shoulder arthroscopy with biceps tenodesis and collagen patch augmentation     After a lengthy discussion of treatment options, including risks, benefits, alternatives, complications of surgical and nonsurgical conservative options, the patient elected surgical repair.    The patient  is aware of the material risks  and complications including, but not limited to injury to adjacent structures, neurovascular injury, infection, numbness, bleeding, implant failure, thermal burns, stiffness, persistent pain, failure to heal, disease transmission from allograft, need for further surgery, dislocation, anesthetic risks, blood clots, risks of death,and others. The probabilities of surgical success and failure discussed with patient given their particular co-morbidities.The time and nature of expected rehabilitation and recovery was discussed.The patient's questions were all answered preoperatively.  No barriers to understanding were noted. I explained the natural history of the disease process and Rx rationale.  I explained to the patient what I considered to be reasonable expectations given their personal situation.  The final treatment plan was arrived at through a shared patient decision making process model.             I personally saw and  evaluated the patient, and participated in the management and treatment plan.   Elspeth Parker, MD Attending Physician, Orthopedic Surgery   This document was dictated using Dragon voice recognition software. A reasonable attempt at proof reading has been made to minimize errors.

## 2024-07-30 NOTE — Anesthesia Procedure Notes (Signed)
 Anesthesia Regional Block: Interscalene brachial plexus block   Pre-Anesthetic Checklist: , timeout performed,  Correct Patient, Correct Site, Correct Laterality,  Correct Procedure, Correct Position, site marked,  Risks and benefits discussed,  Surgical consent,  Pre-op evaluation,  At surgeon's request and post-op pain management  Laterality: Left  Prep: chloraprep       Needles:  Injection technique: Single-shot  Needle Type: Echogenic Stimulator Needle     Needle Length: 9cm  Needle Gauge: 21     Additional Needles:   Procedures:,,,, ultrasound used (permanent image in chart),,    Narrative:  Start time: 07/30/2024 7:09 AM End time: 07/30/2024 7:14 AM Injection made incrementally with aspirations every 5 mL.  Performed by: Personally  Anesthesiologist: Peggye Delon Brunswick, MD  Additional Notes: Discussed risks and benefits of nerve block including, but not limited to, prolonged and/or permanent nerve injury involving sensory and/or motor function. Monitors were applied and a time-out was performed. The nerve and associated structures were visualized under ultrasound guidance. After negative aspiration, local anesthetic was slowly injected around the nerve. There was no evidence of high pressure during the procedure. There were no paresthesias. VSS remained stable and the patient tolerated the procedure well.

## 2024-07-30 NOTE — Brief Op Note (Signed)
   Brief Op Note  Date of Surgery: 07/30/2024  Preoperative Diagnosis: LEFT ANTERIOR SHOULDER IMPINGEMENT  Postoperative Diagnosis: same  Procedure: Procedure(s): ARTHROSCOPY, SHOULDER, WITH ROTATOR CUFF REPAIR  Implants: Implant Name Type Inv. Item Serial No. Manufacturer Lot No. LRB No. Used Action  ANCHOR QUATTRO LINK KNTLS 2.9 - [id number] Anchor ANCHOR QUATTRO LINK KNTLS 2.9  [employer](ORTH,TRAU,BIO,SG) [id number] Left 1 Implanted  IMPL TAPESTRY BIOINTEGR 30X30 - [id number] Orthopedic Implant IMPL TAPESTRY BIOINTEGR 30X30  [employer]ON(ORTH,TRAU,BIO,SG) [id number] Left 1 Implanted  ANCHOR DUAL INSERTER - [id number] Anchor ANCHOR DUAL INSERTER  ZIMMER RECON(ORTH,TRAU,BIO,SG) [id number] Left 3 Implanted  ANCHOR DUAL INSERTER - [id number] Anchor ANCHOR DUAL INSERTER  ZIMMER [employer]ON(ORTH,TRAU,BIO,SG) [id number] Left 1 Implanted    Surgeons: Surgeon(s): Genelle Standing, MD  Anesthesia: General    Estimated Blood Loss: See anesthesia record  Complications: None  Condition to PACU: Stable  Standing LITTIE Genelle, MD 07/30/2024 8:59 AM

## 2024-07-31 ENCOUNTER — Ambulatory Visit (HOSPITAL_BASED_OUTPATIENT_CLINIC_OR_DEPARTMENT_OTHER): Admitting: Orthopaedic Surgery

## 2024-07-31 ENCOUNTER — Encounter (HOSPITAL_BASED_OUTPATIENT_CLINIC_OR_DEPARTMENT_OTHER): Payer: Self-pay | Admitting: Orthopaedic Surgery

## 2024-08-02 LAB — GENECONNECT MOLECULAR SCREEN: Genetic Analysis Overall Interpretation: NEGATIVE

## 2024-08-06 ENCOUNTER — Ambulatory Visit: Admitting: Sports Medicine

## 2024-08-07 ENCOUNTER — Ambulatory Visit (HOSPITAL_BASED_OUTPATIENT_CLINIC_OR_DEPARTMENT_OTHER): Admitting: Orthopaedic Surgery

## 2024-08-09 ENCOUNTER — Ambulatory Visit (INDEPENDENT_AMBULATORY_CARE_PROVIDER_SITE_OTHER): Admitting: Orthopaedic Surgery

## 2024-08-09 ENCOUNTER — Other Ambulatory Visit (HOSPITAL_BASED_OUTPATIENT_CLINIC_OR_DEPARTMENT_OTHER): Payer: Self-pay

## 2024-08-09 ENCOUNTER — Other Ambulatory Visit (HOSPITAL_BASED_OUTPATIENT_CLINIC_OR_DEPARTMENT_OTHER): Payer: Self-pay | Admitting: Orthopaedic Surgery

## 2024-08-09 DIAGNOSIS — M7542 Impingement syndrome of left shoulder: Secondary | ICD-10-CM

## 2024-08-09 MED ORDER — METHYLPREDNISOLONE 4 MG PO TBPK
ORAL_TABLET | ORAL | 0 refills | Status: DC
  Filled 2024-08-09: qty 21, 6d supply, fill #0

## 2024-08-09 NOTE — Progress Notes (Signed)
 Post Operative Evaluation    Procedure/Date of Surgery: Left shoulder arthroscopy with biceps tenodesis and collagen patch augmentation 11/25  Interval History:    Presents 2 weeks status post above procedure.  Overall he is still having some soreness and pain.  He has not been able to work with physical therapy at this point   PMH/PSH/Family History/Social History/Meds/Allergies:    Past Medical History:  Diagnosis Date   Arthritis    Gout    History of kidney stones    Hyperlipidemia    Hypertension    Thoracic aortic aneurysm 2022   stable 4cm by CTA 12/2022   Past Surgical History:  Procedure Laterality Date   Anterior Cervical Decompression Fusion  2019   BICEPT TENODESIS Right 02/19/2024   Procedure: SUBPECTORAL BICEPS TENODESIS AND COLLAGEN PATCH AUGMENTATION;  Surgeon: Genelle Standing, MD;  Location: [facility];  Service: Orthopedics;  Laterality: Right;   DISTAL CLAVICLE EXCISION Right 2020   Right Shoulder Distal Clavicle Excision   JOINT REPLACEMENT  2008, 2013   Bilateral hip replacements   LAMINECTOMY  1984   L4-L5   LAMINECTOMY  1996   L3-L4   LUMBAR FUSION  01/11/2021   may 2022- L3-L5   LUMBAR FUSION  09/29/2021   L2-L3   SHOULDER ARTHROSCOPY WITH ROTATOR CUFF REPAIR Right 02/19/2024   Procedure: RIGHT SHOULDER ARTHROSCOPY;  Surgeon: Genelle Standing, MD;  Location: [facility];  Service: Orthopedics;  Laterality: Right;   SHOULDER ARTHROSCOPY WITH ROTATOR CUFF REPAIR Left 07/30/2024   Procedure: ARTHROSCOPY, SHOULDER, WITH ROTATOR CUFF REPAIR;  Surgeon: Genelle Standing, MD;  Location: [facility];  Service: Orthopedics;  Laterality: Left;  LEFT SHOULDER ARTHROSCOPY WITH ACROMIOPLASTY, BICEPS TENODESIS AND COLLAGEN PATCH AUGMENTATION   SPINE SURGERY  2019, 2022, 2023   C4-C6 fusion, L2-L5 fusion   TOTAL HIP ARTHROPLASTY Left    left hip 09/2006   TOTAL HIP ARTHROPLASTY   09/14/2011   Right. Procedure: TOTAL HIP ARTHROPLASTY;  Surgeon: Toribio JULIANNA Chancy, MD;  Location: [facility] OR;  Service: Orthopedics;  Laterality: Right;   Social History   Socioeconomic History   Marital status: Married    Spouse name: Not on file   Number of children: 2   Years of education: Not on file   Highest education level: Not on file  Occupational History   Not on file  Tobacco Use   Smoking status: Never   Smokeless tobacco: Never  Vaping Use   Vaping status: Never Used  Substance and Sexual Activity   Alcohol use: Yes    Alcohol/week: 4.0 - 5.0 standard drinks of alcohol    Types: 4 - 5 Cans of beer per week    Comment: social   Drug use: Yes    Types: Marijuana    Comment: for pain- very occasionally   Sexual activity: Yes    Partners: Female  Other Topics Concern   Not on file  Social History Narrative   Married 28 years in 2023. Daughter 68, son 68 in 2023.    From [place]       Retired from [employer]- enjoying retirement      Hobbies: traveling, hiking, walks daily, live music   Social Drivers of Health   Financial Resource Strain: Low Risk  (07/26/2023)   Overall Physicist, Medical  Strain (CARDIA)    Difficulty of Paying Living Expenses: Not hard at all  Food Insecurity: No Food Insecurity (07/26/2023)   Hunger Vital Sign    Worried About Running Out of Food in the Last Year: Never true    Ran Out of Food in the Last Year: Never true  Transportation Needs: No Transportation Needs (07/26/2023)   PRAPARE - Administrator, Civil Service (Medical): No    Lack of Transportation (Non-Medical): No  Physical Activity: Sufficiently Active (07/26/2023)   Exercise Vital Sign    Days of Exercise per Week: 5 days    Minutes of Exercise per Session: 90 min  Stress: No Stress Concern Present (07/26/2023)   [employer] of Occupational Health - Occupational Stress Questionnaire    Feeling of Stress : Not at all  Social Connections: Moderately  Isolated (07/26/2023)   Social Connection and Isolation Panel    Frequency of Communication with Friends and Family: More than three times a week    Frequency of Social Gatherings with Friends and Family: More than three times a week    Attends Religious Services: Never    Database Administrator or Organizations: No    Attends Engineer, Structural: Never    Marital Status: Married   Family History  Problem Relation Age of Onset   Diabetes Mother        passed at 33   Skin cancer Father    Gout Father    Hypertension Father        passed at 84   Hyperlipidemia Father    CAD Brother        CABG 44   Cancer Brother    Diabetes Brother    Healthy Brother    Healthy Brother    Alcoholism Brother    Depression Brother        suicide age 28   Healthy Maternal Grandmother        lived to 33   Brain cancer Maternal Grandfather        age 5- was an MD   CAD Paternal Grandmother        died in 21s   CAD Paternal Grandfather        died in 23s   Obesity Sister    Arthritis Sister    Healthy Sister    No Known Allergies Current Outpatient Medications  Medication Sig Dispense Refill   methylPREDNISolone  (MEDROL  DOSEPAK) 4 MG TBPK tablet Take per packet instructions 21 each 0   acetaminophen  (TYLENOL ) 500 MG tablet Take 500 mg by mouth every 6 (six) hours as needed (pain.).     allopurinol  (ZYLOPRIM ) 300 MG tablet TAKE 1 TABLET EVERY MORNING 90 tablet 3   aspirin  EC 325 MG tablet Take 1 tablet (325 mg total) by mouth daily. 14 tablet 0   aspirin  EC 325 MG tablet Take 1 tablet (325 mg total) by mouth daily. 14 tablet 0   celecoxib  (CELEBREX ) 200 MG capsule Take 1 capsule (200 mg total) by mouth 2 (two) times daily. 60 capsule 3   Cholecalciferol (VITAMIN D -3 PO) Take 2,000 Units by mouth in the morning.     colchicine  0.6 MG tablet Take 1 tablet (0.6 mg total) by mouth daily. At start of flare- take 2 and then an hour later (max 3 day one) if not improved can take 1 more,  then daily until gout flare resolves (Patient taking differently: Take 0.6 mg by mouth daily as needed (  gout attacks).) 30 tablet 3   cyclobenzaprine  (FLEXERIL ) 10 MG tablet Take 1 tablet (10 mg total) by mouth 3 (three) times daily as needed for muscle spasms. 30 tablet 0   diclofenac  Sodium (PENNSAID ) 2 % SOLN Apply 1-2 Pump topically 2 (two) times daily as needed (pain.).     losartan  (COZAAR ) 100 MG tablet Take 1 tablet (100 mg total) by mouth daily. 90 tablet 3   Multiple Vitamin (MULTIVITAMIN WITH MINERALS) TABS tablet Take 1 tablet by mouth in the morning.     oxyCODONE  (ROXICODONE ) 5 MG immediate release tablet Take 1 tablet (5 mg total) by mouth every 4 (four) hours as needed for severe pain (pain score 7-10) or breakthrough pain. 25 tablet 0   simvastatin  (ZOCOR ) 40 MG tablet TAKE 1 TABLET DAILY 90 tablet 3   No current facility-administered medications for this visit.   No results found.  Review of Systems:   A ROS was performed including pertinent positives and negatives as documented in the HPI.   Musculoskeletal Exam:    There were no vitals taken for this visit.  Left shoulder incisions are well-appearing without erythema or drainage.  Active forward elevation is to 90 degrees external Tatian at side is to 20 degrees internal rotation deferred today  Imaging:      I personally reviewed and interpreted the radiographs.   Assessment:   2 weeks status post left shoulder arthroscopy with rotator cuff collagen patch augmentation and biceps.  At this time he is continuing to work through the biceps tenodesis protocol I will see him back in 4 weeks for reassessment.  Medrol  Dosepak provided to help with acute post inflammation  Plan :    - Return to clinic 4 weeks for reassessment      I personally saw and evaluated the patient, and participated in the management and treatment plan.  Elspeth Parker, MD Attending Physician, Orthopedic Surgery  This document was  dictated using Dragon voice recognition software. A reasonable attempt at proof reading has been made to minimize errors.

## 2024-08-12 ENCOUNTER — Ambulatory Visit: Admitting: Sports Medicine

## 2024-08-12 VITALS — BP 132/78 | HR 39 | Ht 69.0 in | Wt 168.0 lb

## 2024-08-12 DIAGNOSIS — S76312A Strain of muscle, fascia and tendon of the posterior muscle group at thigh level, left thigh, initial encounter: Secondary | ICD-10-CM

## 2024-08-12 DIAGNOSIS — S76302A Unspecified injury of muscle, fascia and tendon of the posterior muscle group at thigh level, left thigh, initial encounter: Secondary | ICD-10-CM

## 2024-08-12 DIAGNOSIS — M76892 Other specified enthesopathies of left lower limb, excluding foot: Secondary | ICD-10-CM

## 2024-08-12 NOTE — Patient Instructions (Signed)
 Hamstring HEP   8 week follow up   PT referral

## 2024-08-12 NOTE — Progress Notes (Signed)
 Ben Pennye Beeghly D.[facility]MENT[facility] [address] [place] [address] Phone: [phone]   Assessment and Plan:     1. Hamstring tendinitis of left thigh (Primary) 2. Strain of left hamstring muscle, initial encounter -Chronic with exacerbation, initial visit - Most consistent with recurrent hamstring strain and tendinitis primarily of left hamstring, likely from overuse, cycling and hiking activities - Start HEP and physical therapy for hamstring - Patient has been intermittently using Celebrex , ibuprofen , prednisone  Dosepak after recent left shoulder procedure.  Will not prescribe additional anti-inflammatories, but could use Celebrex  200 mg as needed for pain relief  15 additional minutes spent for educating Therapeutic Home Exercise Program.  This included exercises focusing on stretching, strengthening, with focus on eccentric aspects.   Long term goals include an improvement in range of motion, strength, endurance as well as avoiding reinjury. Patient's frequency would include in 1-2 times a day, 3-5 times a week for a duration of 6-12 weeks. Proper technique shown and discussed handout in great detail with ATC.  All questions were discussed and answered.      Pertinent previous records reviewed include none   Follow Up: 8 weeks for reevaluation.  If still experiencing symptoms, could perform ultrasound versus advanced imaging   Subjective:   I, Kyle Lawson, am serving as a neurosurgeon for Doctor Morene Mace  Chief Complaint: hamstring pain  HPI:   08/12/24 Patient is a 68 year old male with hamstring pain. Patient states left hamstring pain started a month ago. Pain starts behind the knee and goes up to the glutes. Decreased ROM. Pain going up and down steps. Intermittent numbness and tingling left foot. Does ride a stationary bike often. Prednisone  dos pak has been helping.   Relevant Historical Information: Hypertension, fatty liver,  gout  Additional pertinent review of systems negative.   Current Outpatient Medications:    acetaminophen  (TYLENOL ) 500 MG tablet, Take 500 mg by mouth every 6 (six) hours as needed (pain.)., Disp: , Rfl:    allopurinol  (ZYLOPRIM ) 300 MG tablet, TAKE 1 TABLET EVERY MORNING, Disp: 90 tablet, Rfl: 3   aspirin  EC 325 MG tablet, Take 1 tablet (325 mg total) by mouth daily., Disp: 14 tablet, Rfl: 0   aspirin  EC 325 MG tablet, Take 1 tablet (325 mg total) by mouth daily., Disp: 14 tablet, Rfl: 0   celecoxib  (CELEBREX ) 200 MG capsule, Take 1 capsule (200 mg total) by mouth 2 (two) times daily., Disp: 60 capsule, Rfl: 3   Cholecalciferol (VITAMIN D -3 PO), Take 2,000 Units by mouth in the morning., Disp: , Rfl:    colchicine  0.6 MG tablet, Take 1 tablet (0.6 mg total) by mouth daily. At start of flare- take 2 and then an hour later (max 3 day one) if not improved can take 1 more, then daily until gout flare resolves (Patient taking differently: Take 0.6 mg by mouth daily as needed (gout attacks).), Disp: 30 tablet, Rfl: 3   cyclobenzaprine  (FLEXERIL ) 10 MG tablet, Take 1 tablet (10 mg total) by mouth 3 (three) times daily as needed for muscle spasms., Disp: 30 tablet, Rfl: 0   diclofenac  Sodium (PENNSAID ) 2 % SOLN, Apply 1-2 Pump topically 2 (two) times daily as needed (pain.)., Disp: , Rfl:    losartan  (COZAAR ) 100 MG tablet, Take 1 tablet (100 mg total) by mouth daily., Disp: 90 tablet, Rfl: 3   methylPREDNISolone  (MEDROL  DOSEPAK) 4 MG TBPK tablet, Take per packet instructions, Disp: 21 each, Rfl: 0  Multiple Vitamin (MULTIVITAMIN WITH MINERALS) TABS tablet, Take 1 tablet by mouth in the morning., Disp: , Rfl:    oxyCODONE  (ROXICODONE ) 5 MG immediate release tablet, Take 1 tablet (5 mg total) by mouth every 4 (four) hours as needed for severe pain (pain score 7-10) or breakthrough pain., Disp: 25 tablet, Rfl: 0   simvastatin  (ZOCOR ) 40 MG tablet, TAKE 1 TABLET DAILY, Disp: 90 tablet, Rfl: 3    Objective:     Vitals:   08/12/24 1033  BP: 132/78  Pulse: (!) 39  SpO2: 100%  Weight: 168 lb (76.2 kg)  Height: 5' 9 (1.753 m)      Body mass index is 24.81 kg/m.    Physical Exam:    General: awake, alert, and oriented no acute distress, nontoxic Skin: no suspicious lesions or rashes Neuro:sensation intact distally with no deficits, normal muscle tone, no atrophy, strength 5/5 in all tested lower ext groups Psych: normal mood and affect, speech clear   Left thigh/hip: No deformity, swelling or wasting ROM Flexion 90, ext 30, IR 45, ER 45 TTP middle hamstring musculature Tenderness and mild weakness with resisted flexion of knee at 180 degrees, 90 degrees, 30 degrees when compared to right leg.  Less pain with resisted knee flexion and internal and external rotation NTTP over the hip flexors, greater trochanter, gluteal musculature, si joint, lumbar spine Negative log roll with FROM Negative FABER Negative FADIR Negative Piriformis test Negative trendelenberg Gait normal  Straight leg negative bilaterally, though   hamstring tightness bilaterally  Electronically signed by:  Odis Mace D.[facility] 11:00 AM 08/12/24

## 2024-08-14 ENCOUNTER — Ambulatory Visit: Admitting: Sports Medicine

## 2024-08-14 ENCOUNTER — Encounter (HOSPITAL_BASED_OUTPATIENT_CLINIC_OR_DEPARTMENT_OTHER): Payer: Self-pay | Admitting: Physical Therapy

## 2024-08-14 ENCOUNTER — Other Ambulatory Visit: Payer: Self-pay

## 2024-08-14 ENCOUNTER — Ambulatory Visit (HOSPITAL_BASED_OUTPATIENT_CLINIC_OR_DEPARTMENT_OTHER): Admitting: Physical Therapy

## 2024-08-14 DIAGNOSIS — M79605 Pain in left leg: Secondary | ICD-10-CM

## 2024-08-14 DIAGNOSIS — G8929 Other chronic pain: Secondary | ICD-10-CM | POA: Insufficient documentation

## 2024-08-14 DIAGNOSIS — M25512 Pain in left shoulder: Secondary | ICD-10-CM

## 2024-08-14 DIAGNOSIS — M25612 Stiffness of left shoulder, not elsewhere classified: Secondary | ICD-10-CM | POA: Diagnosis present

## 2024-08-14 DIAGNOSIS — M6281 Muscle weakness (generalized): Secondary | ICD-10-CM | POA: Diagnosis present

## 2024-08-14 DIAGNOSIS — S76312A Strain of muscle, fascia and tendon of the posterior muscle group at thigh level, left thigh, initial encounter: Secondary | ICD-10-CM | POA: Diagnosis not present

## 2024-08-14 DIAGNOSIS — M25511 Pain in right shoulder: Secondary | ICD-10-CM | POA: Diagnosis present

## 2024-08-14 DIAGNOSIS — M7542 Impingement syndrome of left shoulder: Secondary | ICD-10-CM | POA: Insufficient documentation

## 2024-08-14 DIAGNOSIS — M76892 Other specified enthesopathies of left lower limb, excluding foot: Secondary | ICD-10-CM | POA: Insufficient documentation

## 2024-08-14 NOTE — Therapy (Signed)
 OUTPATIENT PHYSICAL THERAPY SHOULDER EVALUATION   Patient Name: Kyle Lawson MRN: [id number] DOB:Feb 26, 1956, 68 y.o., male Today's Date: 08/14/2024  END OF SESSION:  PT End of Session - 08/14/24 1500     Visit Number 1    Number of Visits 17    Date for Recertification  11/06/24    Authorization Type MCR and BCBS    Authorization Time Period 08/14/24 to 11/07/23    Progress Note Due on Visit 10    PT Start Time 1458    PT Stop Time 1540    PT Time Calculation (min) 42 min    Activity Tolerance Patient tolerated treatment well    Behavior During Therapy [facility] for tasks assessed/performed          Past Medical History:  Diagnosis Date   Arthritis    Gout    History of kidney stones    Hyperlipidemia    Hypertension    Thoracic aortic aneurysm 2022   stable 4cm by CTA 12/2022   Past Surgical History:  Procedure Laterality Date   Anterior Cervical Decompression Fusion  2019   BICEPT TENODESIS Right 02/19/2024   Procedure: SUBPECTORAL BICEPS TENODESIS AND COLLAGEN PATCH AUGMENTATION;  Surgeon: Genelle Standing, MD;  Location: [facility];  Service: Orthopedics;  Laterality: Right;   DISTAL CLAVICLE EXCISION Right 2020   Right Shoulder Distal Clavicle Excision   JOINT REPLACEMENT  2008, 2013   Bilateral hip replacements   LAMINECTOMY  1984   L4-L5   LAMINECTOMY  1996   L3-L4   LUMBAR FUSION  01/11/2021   may 2022- L3-L5   LUMBAR FUSION  09/29/2021   L2-L3   SHOULDER ARTHROSCOPY WITH ROTATOR CUFF REPAIR Right 02/19/2024   Procedure: RIGHT SHOULDER ARTHROSCOPY;  Surgeon: Genelle Standing, MD;  Location: [facility];  Service: Orthopedics;  Laterality: Right;   SHOULDER ARTHROSCOPY WITH ROTATOR CUFF REPAIR Left 07/30/2024   Procedure: ARTHROSCOPY, SHOULDER, WITH ROTATOR CUFF REPAIR;  Surgeon: Genelle Standing, MD;  Location: [facility];  Service: Orthopedics;  Laterality: Left;  LEFT SHOULDER ARTHROSCOPY WITH ACROMIOPLASTY, BICEPS  TENODESIS AND COLLAGEN PATCH AUGMENTATION   SPINE SURGERY  2019, 2022, 2023   C4-C6 fusion, L2-L5 fusion   TOTAL HIP ARTHROPLASTY Left    left hip 09/2006   TOTAL HIP ARTHROPLASTY  09/14/2011   Right. Procedure: TOTAL HIP ARTHROPLASTY;  Surgeon: Toribio JULIANNA Chancy, MD;  Location: [facility] OR;  Service: Orthopedics;  Laterality: Right;   Patient Active Problem List   Diagnosis Date Noted   Nontraumatic incomplete tear of left rotator cuff 07/30/2024   Impingement syndrome of left shoulder 07/30/2024   Biceps tendinitis of left upper extremity 07/30/2024   Nontraumatic incomplete tear of right rotator cuff 02/19/2024   Fatty liver 05/20/2022   Essential hypertension 11/04/2021   Hyperlipidemia, unspecified 11/04/2021   Ascending aorta dilation 11/04/2021   Spinal stenosis of lumbar region 09/29/2021   HNP (herniated nucleus pulposus), lumbar 01/11/2021   Acromioclavicular joint pain 07/13/2019   Sternum pain 01/16/2019   Right anterior shoulder pain 11/08/2017   Neck pain 07/04/2017   Gout 02/05/2017   Lumbar spondylosis 01/19/2017   Acute bilateral low back pain with bilateral sciatica 01/19/2017    PCP: Katrinka Senior MD   REFERRING PROVIDER: Genelle Standing, MD for shoulder, [name], DO for HS   REFERRING DIAG: Diagnosis M75.42 (ICD-10-CM) - Impingement syndrome of left shoulder Diagnosis  M76.892 (ICD-10-CM) - Hamstring tendinitis of left thigh  S76.312A (ICD-10-CM) - Strain  of left hamstring muscle, initial encounter    THERAPY DIAG:  Acute pain of left shoulder  Stiffness of left shoulder, not elsewhere classified  Muscle weakness (generalized)  Pain in left leg  Rationale for Evaluation and Treatment: Rehabilitation  ONSET DATE: s/p L shoulder arthroscopy with rotator cuff repair, SAD, biceps tenodesis 07/30/24  SUBJECTIVE:                                                                                                                                                                                       SUBJECTIVE STATEMENT:  Had this same surgery on the other side, saw [name] over at [facility] but schedules were busy and I hadn't been able get in to see her soon enough. First week after surgery was tough but its been feeling better, have been doing some basics. Surgeon took me out of the sling on Friday. Very active at baseline and looking forward to being able to get back into the gym. Doctor told me as 15# limit for lifting. Have been riding my bike a bit, saw Dr. Leonce and he diagnosed me with a badly sprained HS and gave me some exercises to try. HS has gotten better, took 3 weeks off from the bike and focused on walking instead of cycling/cut back on hiking.   Hand dominance: Right  PERTINENT HISTORY: See above   PAIN:  Are you having pain? Pain isn't bad, took ibuprofen  today and was on prednisone /no number given on NPRS  PRECAUTIONS: Shoulder  RED FLAGS: None   WEIGHT BEARING RESTRICTIONS: Yes NWB shoulder at eval   FALLS:  Has patient fallen in last 6 months? No  LIVING ENVIRONMENT: Lives with: lives with their family Lives in: House/apartment   OCCUPATION: Retired- [employer]   PLOF: Independent, Independent with basic ADLs, Independent with gait, and Independent with transfers  PATIENT GOALS: get use of arm back, be able to play golf in the spring, be able to exercise   NEXT MD VISIT:   OBJECTIVE:  Note: Objective measures were completed at Evaluation unless otherwise noted.    PATIENT SURVEYS:  PSFS: THE PATIENT SPECIFIC FUNCTIONAL SCALE  Place score of 0-10 (0 = unable to perform activity and 10 = able to perform activity at the same level as before injury or problem)  Activity Date: 08/14/24    Sleeping/finding good sleeping position 3    2. Getting non button shirts on/off 2    3. Reaching to open car door  1    4. Gym (upper body) 0    Total Score 1.5      Total Score = Sum of activity  scores/number  of activities  Minimally Detectable Change: 3 points (for single activity); 2 points (for average score)  [employer] (nd). The Patient Specific Functional Scale . Retrieved from [email or web address]   COGNITION: Overall cognitive status: Within functional limits for tasks assessed     SENSATION: Not tested  POSTURE: Rounded shoulders, forward head   UPPER EXTREMITY ROM:   ROM  Right eval Left Eval supine   Shoulder flexion  PROM 110*, AAROM 110*  Shoulder extension    Shoulder abduction  PROM 90*  Shoulder adduction    Shoulder internal rotation    Shoulder external rotation  PROM 30* by his side   Elbow flexion    Elbow extension    Wrist flexion    Wrist extension    Wrist ulnar deviation    Wrist radial deviation    Wrist pronation    Wrist supination    (Blank rows = not tested)  UPPER EXTREMITY MMT:  MMT Right eval Left eval  Shoulder flexion    Shoulder extension    Shoulder abduction    Shoulder adduction    Shoulder internal rotation    Shoulder external rotation    Middle trapezius    Lower trapezius    Elbow flexion    Elbow extension    Wrist flexion    Wrist extension    Wrist ulnar deviation    Wrist radial deviation    Wrist pronation    Wrist supination    Grip strength (lbs)    (Blank rows = not tested)  MMT not tested at eval                                                                                                                              TREATMENT DATE:   08/14/24  Eval, POC, HEP  All incisions C/D/I, well healed at this point with no signs of infection   ROM/stretching as appropriate, HEP review    PATIENT EDUCATION: Education details: exam findings, POC, HEP, biceps tenodesis precautions  Person educated: Patient Education method: Programmer, Multimedia, Demonstration, and Handouts Education comprehension: verbalized understanding, returned  demonstration, and needs further education  HOME EXERCISE PROGRAM:  Oked him for supine aarom shoulder flexion, rows and shoulder extension to neutral with yellow TB plus the following  Access Code: [email or web address] URL: [email or web address] Date: 08/14/2024 Prepared by: Josette Rough  Exercises - Seated Shoulder External Rotation AAROM with Cane and Hand in Neutral  - 1 x daily - 7 x weekly - 2 sets - 10 reps - 2 seconds  hold - Seated Shoulder Abduction Towel Slide at Table Top  - 1 x daily - 7 x weekly - 2 sets - 10 reps - 2 seconds  hold    ASSESSMENT:  CLINICAL IMPRESSION:  Patient is a 68 y.o. M who was seen today for physical therapy evaluation and treatment for skilled PT care following L shoulder arthroscopy with rotator cuff repair, SAD, biceps tenodesis  07/30/24. Of note he did have an additional order placed for HS strain, we were only able to address his post-op shoulder today however will plan on working on HS pain as time/reasonable efforts to recover from shoulder surgery allow.   OBJECTIVE IMPAIRMENTS: decreased ROM, decreased strength, increased fascial restrictions, impaired UE functional use, postural dysfunction, and pain.   ACTIVITY LIMITATIONS: carrying, lifting, sleeping, bathing, toileting, dressing, reach over head, and hygiene/grooming  PARTICIPATION LIMITATIONS: meal prep, cleaning, laundry, driving, shopping, community activity, and yard work  PERSONAL FACTORS: Past/current experiences and Time since onset of injury/illness/exacerbation are also affecting patient's functional outcome.   REHAB POTENTIAL: Good  CLINICAL DECISION MAKING: Stable/uncomplicated  EVALUATION COMPLEXITY: Low   GOALS: Goals reviewed with patient? No  SHORT TERM GOALS: Target date: 09/25/2024    Patient will be compliant with appropriate progressive HEP        GOAL STATUS: Initial  2. L shoulder flexion and ABD A/PROM to be at least 150*         GOAL STATUS:  Initial    3. L shoulder IR and IR A/PROM to be at least 60* at 90* ABD           GOAL STATUS: Initial    4. Will be more aware of posture with all functional tasks with use of ergonomic aides PRN/as desired      GOAL STATUS: Initial   5. Hamstring/LE pain PT evaluation to have been completed/appropriate goals written as time/shoulder surgery rehab allows GOAL STATUS: Initial    LONG TERM GOALS: Target date: 11/06/2024   MMT to have improved by at least one grade in all weak groups       GOAL STATUS: Initial    2. AROM to have normalized and will be pain free all planes of motion      GOAL STATUS: Initial    3. Pain to be no more than 2/10 with all functional tasks L shoulder      GOAL STATUS: Initial   4. Will be able to perform all functional household and work based tasks without increase from resting pain levels     GOAL STATUS: Initial   5. PSFS to have improved by at least 3 points to show improved QOL and subjective perception of condition      GOAL STATUS: Initial   6. Lower extremity LTGs to have been further established and addressed as time/rehab from shoulder surgery allows  GOAL STATUS: Initial     PLAN:  PT FREQUENCY: 2x/week  PT DURATION: 12 weeks  PLANNED INTERVENTIONS: 97164- PT Re-evaluation, 97750- Physical Performance Testing, 97110-Therapeutic exercises, 97530- Therapeutic activity, [id number]- Neuromuscular re-education, 97535- Self Care, [id number]- Manual therapy, [id number]- Gait training, [id number]- Aquatic Therapy, 97016- Vasopneumatic device, [id number]- Ultrasound, and 97033- Ionotophoresis 4mg /ml Dexamethasone   PLAN FOR NEXT SESSION: progress post-op shoulder rehab as appropriate per protocol (biceps tenodesis protocol per op notes), evaluate and set goals for hamstring/LE pain when able. Pt reports he was told 15# lifting restriction. Eval and treat HS as time allows, prioritize post-op shoulder   Josette Rough, PT, DPT 08/14/24 3:52 PM

## 2024-08-20 ENCOUNTER — Ambulatory Visit: Admitting: Physical Therapy

## 2024-08-20 ENCOUNTER — Ambulatory Visit

## 2024-08-20 VITALS — BP 132/82 | HR 97 | Temp 97.9°F | Ht 69.0 in | Wt 180.2 lb

## 2024-08-20 DIAGNOSIS — Z0000 Encounter for general adult medical examination without abnormal findings: Secondary | ICD-10-CM

## 2024-08-20 NOTE — Patient Instructions (Signed)
 Kyle Lawson,  Thank you for taking the time for your Medicare Wellness Visit. I appreciate your continued commitment to your health goals. Please review the care plan we discussed, and feel free to reach out if I can assist you further.  Please note that Annual Wellness Visits do not include a physical exam. Some assessments may be limited, especially if the visit was conducted virtually. If needed, we may recommend an in-person follow-up with your provider.  Ongoing Care Seeing your primary care provider every 3 to 6 months helps us  monitor your health and provide consistent, personalized care.   Referrals If a referral was made during today's visit and you haven't received any updates within two weeks, please contact the referred provider directly to check on the status.  Recommended Screenings:  Health Maintenance  Topic Date Due   COVID-19 Vaccine (7 - 2025-26 season) 05/06/2024   Medicare Annual Wellness Visit  08/20/2025   DTaP/Tdap/Td vaccine (3 - Td or Tdap) 12/17/2028   Colon Cancer Screening  01/07/2032   Pneumococcal Vaccine for age over 69  Completed   Flu Shot  Completed   Hepatitis C Screening  Completed   Zoster (Shingles) Vaccine  Completed   Meningitis B Vaccine  Aged Out       08/14/2024    3:00 PM  Advanced Directives  Does Patient Have a Medical Advance Directive? Yes  Type of Estate Agent of [place];Living will  Does patient want to make changes to medical advance directive? No - Patient declined  Copy of Healthcare Power of Attorney in Chart? No - copy requested    Vision: Annual vision screenings are recommended for early detection of glaucoma, cataracts, and diabetic retinopathy. These exams can also reveal signs of chronic conditions such as diabetes and high blood pressure.  Dental: Annual dental screenings help detect early signs of oral cancer, gum disease, and other conditions linked to overall health, including heart disease  and diabetes.  Please see the attached documents for additional preventive care recommendations.

## 2024-08-20 NOTE — Progress Notes (Signed)
 Chief Complaint  Patient presents with   Medicare Wellness     Subjective:   Kyle Lawson is a 68 y.o. male who presents for a Medicare Annual Wellness Visit.  Visit info / Clinical Intake: Medicare Wellness Visit Type:: Subsequent Annual Wellness Visit Persons participating in visit and providing information:: patient Medicare Wellness Visit Mode:: In-person (required for WTM) Interpreter Needed?: No Pre-visit prep was completed: yes AWV questionnaire completed by patient prior to visit?: no Living arrangements:: lives with spouse/significant other Patient's Overall Health Status Rating: very good Typical amount of pain: some Does pain affect daily life?: (!) yes Are you currently prescribed opioids?: (!) yes  Dietary Habits and Nutritional Risks How many meals a day?: 3 Eats fruit and vegetables daily?: yes Most meals are obtained by: preparing own meals; eating out In the last 2 weeks, have you had any of the following?: none Diabetic:: no  Functional Status Activities of Daily Living (to include ambulation/medication): Independent Ambulation: Independent with device- listed below Home Assistive Devices/Equipment: Eyeglasses Medication Administration: Independent Home Management (perform basic housework or laundry): Independent Manage your own finances?: yes Primary transportation is: driving Concerns about vision?: no *vision screening is required for WTM* Concerns about hearing?: no  Fall Screening Falls in the past year?: 0 Number of falls in past year: 0 Was there an injury with Fall?: 0 Fall Risk Category Calculator: 0 Patient Fall Risk Level: Low Fall Risk  Fall Risk Patient at Risk for Falls Due to: No Fall Risks Fall risk Follow up: Falls prevention discussed  Home and Transportation Safety: All rugs have non-skid backing?: N/A, no rugs All stairs or steps have railings?: yes Grab bars in the bathtub or shower?: yes Have non-skid surface in bathtub  or shower?: yes Good home lighting?: yes Regular seat belt use?: yes Hospital stays in the last year:: no  Cognitive Assessment Difficulty concentrating, remembering, or making decisions? : no Will 6CIT or Mini Cog be Completed: yes What year is it?: 0 points What month is it?: 0 points Give patient an address phrase to remember (5 components): [address] [facility] About what time is it?: 0 points Count backwards from 20 to 1: 0 points Say the months of the year in reverse: 0 points Repeat the address phrase from earlier: 0 points 6 CIT Score: 0 points  Advance Directives (For Healthcare) Does Patient Have a Medical Advance Directive?: Yes Does patient want to make changes to medical advance directive?: No - Patient declined Type of Advance Directive: Healthcare Power of Attorney Copy of Healthcare Power of Attorney in Chart?: No - copy requested Copy of Living Will in Chart?: No - copy requested Would patient like information on creating a medical advance directive?: No - Patient declined  Reviewed/Updated  Reviewed/Updated: Reviewed All (Medical, Surgical, Family, Medications, Allergies, Care Teams, Patient Goals)    Allergies (verified) Patient has no known allergies.   Current Medications (verified) Outpatient Encounter Medications as of 08/20/2024  Medication Sig   acetaminophen  (TYLENOL ) 500 MG tablet Take 500 mg by mouth every 6 (six) hours as needed (pain.).   allopurinol  (ZYLOPRIM ) 300 MG tablet TAKE 1 TABLET EVERY MORNING   celecoxib  (CELEBREX ) 200 MG capsule Take 1 capsule (200 mg total) by mouth 2 (two) times daily.   Cholecalciferol (VITAMIN D -3 PO) Take 2,000 Units by mouth in the morning.   colchicine  0.6 MG tablet Take 1 tablet (0.6 mg total) by mouth daily. At start of flare- take 2 and then an hour  later (max 3 day one) if not improved can take 1 more, then daily until gout flare resolves   cyclobenzaprine  (FLEXERIL ) 10 MG tablet Take 1 tablet (10 mg  total) by mouth 3 (three) times daily as needed for muscle spasms.   diclofenac  Sodium (PENNSAID ) 2 % SOLN Apply 1-2 Pump topically 2 (two) times daily as needed (pain.).   losartan  (COZAAR ) 100 MG tablet Take 1 tablet (100 mg total) by mouth daily.   Multiple Vitamin (MULTIVITAMIN WITH MINERALS) TABS tablet Take 1 tablet by mouth in the morning.   oxyCODONE  (ROXICODONE ) 5 MG immediate release tablet Take 1 tablet (5 mg total) by mouth every 4 (four) hours as needed for severe pain (pain score 7-10) or breakthrough pain.   simvastatin  (ZOCOR ) 40 MG tablet TAKE 1 TABLET DAILY   aspirin  EC 325 MG tablet Take 1 tablet (325 mg total) by mouth daily. (Patient not taking: Reported on 08/20/2024)   [DISCONTINUED] aspirin  EC 325 MG tablet Take 1 tablet (325 mg total) by mouth daily.   [DISCONTINUED] methylPREDNISolone  (MEDROL  DOSEPAK) 4 MG TBPK tablet Take per packet instructions   No facility-administered encounter medications on file as of 08/20/2024.    History: Past Medical History:  Diagnosis Date   Arthritis    Gout    History of kidney stones    Hyperlipidemia    Hypertension    Thoracic aortic aneurysm 2022   stable 4cm by CTA 12/2022   Past Surgical History:  Procedure Laterality Date   Anterior Cervical Decompression Fusion  2019   BICEPT TENODESIS Right 02/19/2024   Procedure: SUBPECTORAL BICEPS TENODESIS AND COLLAGEN PATCH AUGMENTATION;  Surgeon: Genelle Standing, MD;  Location: [facility];  Service: Orthopedics;  Laterality: Right;   DISTAL CLAVICLE EXCISION Right 2020   Right Shoulder Distal Clavicle Excision   JOINT REPLACEMENT  2008, 2013   Bilateral hip replacements   LAMINECTOMY  1984   L4-L5   LAMINECTOMY  1996   L3-L4   LUMBAR FUSION  01/11/2021   may 2022- L3-L5   LUMBAR FUSION  09/29/2021   L2-L3   SHOULDER ARTHROSCOPY WITH ROTATOR CUFF REPAIR Right 02/19/2024   Procedure: RIGHT SHOULDER ARTHROSCOPY;  Surgeon: Genelle Standing, MD;  Location: [facility];  Service: Orthopedics;  Laterality: Right;   SHOULDER ARTHROSCOPY WITH ROTATOR CUFF REPAIR Left 07/30/2024   Procedure: ARTHROSCOPY, SHOULDER, WITH ROTATOR CUFF REPAIR;  Surgeon: Genelle Standing, MD;  Location: [facility];  Service: Orthopedics;  Laterality: Left;  LEFT SHOULDER ARTHROSCOPY WITH ACROMIOPLASTY, BICEPS TENODESIS AND COLLAGEN PATCH AUGMENTATION   SPINE SURGERY  2019, 2022, 2023   C4-C6 fusion, L2-L5 fusion   TOTAL HIP ARTHROPLASTY Left    left hip 09/2006   TOTAL HIP ARTHROPLASTY  09/14/2011   Right. Procedure: TOTAL HIP ARTHROPLASTY;  Surgeon: Toribio JULIANNA Chancy, MD;  Location: [facility];  Service: Orthopedics;  Laterality: Right;   Family History  Problem Relation Age of Onset   Diabetes Mother        passed at 58   Skin cancer Father    Gout Father    Hypertension Father        passed at 56   Hyperlipidemia Father    CAD Brother        CABG 15   Cancer Brother    Diabetes Brother    Healthy Brother    Healthy Brother    Alcoholism Brother    Depression Brother  suicide age 9   Healthy Maternal Grandmother        lived to 58   Brain cancer Maternal Grandfather        age 62- was an MD   CAD Paternal Grandmother        died in 29s   CAD Paternal Grandfather        died in 51s   Obesity Sister    Arthritis Sister    Healthy Sister    Social History   Occupational History   Not on file  Tobacco Use   Smoking status: Never   Smokeless tobacco: Never  Vaping Use   Vaping status: Never Used  Substance and Sexual Activity   Alcohol use: Yes    Alcohol/week: 4.0 - 5.0 standard drinks of alcohol    Types: 4 - 5 Cans of beer per week    Comment: social   Drug use: Yes    Types: Marijuana    Comment: for pain- very occasionally   Sexual activity: Yes    Partners: Female   Tobacco Counseling Counseling given: Not Answered  SDOH Screenings   Food Insecurity: No Food Insecurity (08/20/2024)  Housing: Unknown  (08/20/2024)  Transportation Needs: No Transportation Needs (08/20/2024)  Utilities: Not At Risk (08/20/2024)  Depression (PHQ2-9): Low Risk (08/20/2024)  Financial Resource Strain: Low Risk (07/26/2023)  Physical Activity: Sufficiently Active (08/20/2024)  Social Connections: Moderately Integrated (08/20/2024)  Stress: No Stress Concern Present (08/20/2024)  Tobacco Use: Low Risk (08/20/2024)  Health Literacy: Adequate Health Literacy (08/20/2024)   See flowsheets for full screening details  Depression Screen PHQ 2 & 9 Depression Scale- Over the past 2 weeks, how often have you been bothered by any of the following problems? Little interest or pleasure in doing things: 0 Feeling down, depressed, or hopeless (PHQ Adolescent also includes...irritable): 0 PHQ-2 Total Score: 0 Trouble falling or staying asleep, or sleeping too much: 0 Feeling tired or having little energy: 0 Poor appetite or overeating (PHQ Adolescent also includes...weight loss): 0 Feeling bad about yourself - or that you are a failure or have let yourself or your family down: 0 Trouble concentrating on things, such as reading the newspaper or watching television (PHQ Adolescent also includes...like school work): 0 Moving or speaking so slowly that other people could have noticed. Or the opposite - being so fidgety or restless that you have been moving around a lot more than usual: 0 Thoughts that you would be better off dead, or of hurting yourself in some way: 0 PHQ-9 Total Score: 0 If you checked off any problems, how difficult have these problems made it for you to do your work, take care of things at home, or get along with other people?: Not difficult at all     Goals Addressed               This Visit's Progress     recover from left shoulder surgery (pt-stated)               Objective:    Today's Vitals   08/20/24 0837  BP: 132/82  Pulse: 97  Temp: 97.9 F (36.6 C)  SpO2: 98%  Weight: 180 lb  3.2 oz (81.7 kg)  Height: 5' 9 (1.753 m)   Body mass index is 26.61 kg/m.  Hearing/Vision screen Hearing Screening - Comments:: Pt denies any hearing issues  Vision Screening - Comments:: Wears rx glasses - up to date with routine eye exams with Dr Debarah Immunizations  and Health Maintenance Health Maintenance  Topic Date Due   COVID-19 Vaccine (7 - 2025-26 season) 05/06/2024   Medicare Annual Wellness (AWV)  08/20/2025   DTaP/Tdap/Td (3 - Td or Tdap) 12/17/2028   Colonoscopy  01/07/2032   Pneumococcal Vaccine: 50+ Years  Completed   Influenza Vaccine  Completed   Hepatitis C Screening  Completed   Zoster Vaccines- Shingrix  Completed   Meningococcal B Vaccine  Aged Out        Assessment/Plan:  This is a routine wellness examination for Kyle Lawson.  Patient Care Team: Katrinka Garnette KIDD, MD as PCP - General (Family Medicine) Debarah Lorrene DEL., MD (Ophthalmology) Haverstock, Tawni CROME, MD as Referring Physician (Dermatology) Pietro Redell RAMAN, MD as Consulting Physician (Cardiology)  I have personally reviewed and noted the following in the patients chart:   Medical and social history Use of alcohol, tobacco or illicit drugs  Current medications and supplements including opioid prescriptions. Functional ability and status Nutritional status Physical activity Advanced directives List of other physicians Hospitalizations, surgeries, and ER visits in previous 12 months Vitals Screenings to include cognitive, depression, and falls Referrals and appointments  No orders of the defined types were placed in this encounter.  In addition, I have reviewed and discussed with patient certain preventive protocols, quality metrics, and best practice recommendations. A written personalized care plan for preventive services as well as general preventive health recommendations were provided to patient.   Ellouise DEL Haws, LPN   87/83/7974   Return in about 1 year (around  08/25/2025).  After Visit Summary: (In Person-Printed) AVS printed and given to the patient  Nurse Notes: No voiced or noted concerns at this time

## 2024-08-22 ENCOUNTER — Ambulatory Visit (HOSPITAL_BASED_OUTPATIENT_CLINIC_OR_DEPARTMENT_OTHER): Admitting: Physical Therapy

## 2024-08-22 ENCOUNTER — Encounter (HOSPITAL_BASED_OUTPATIENT_CLINIC_OR_DEPARTMENT_OTHER): Payer: Self-pay | Admitting: Physical Therapy

## 2024-08-22 DIAGNOSIS — M25511 Pain in right shoulder: Secondary | ICD-10-CM

## 2024-08-22 DIAGNOSIS — M6281 Muscle weakness (generalized): Secondary | ICD-10-CM

## 2024-08-22 DIAGNOSIS — M25612 Stiffness of left shoulder, not elsewhere classified: Secondary | ICD-10-CM

## 2024-08-22 DIAGNOSIS — M25512 Pain in left shoulder: Secondary | ICD-10-CM | POA: Diagnosis not present

## 2024-08-22 DIAGNOSIS — M79605 Pain in left leg: Secondary | ICD-10-CM

## 2024-08-23 ENCOUNTER — Other Ambulatory Visit: Payer: Self-pay | Admitting: Family Medicine

## 2024-08-23 ENCOUNTER — Encounter (HOSPITAL_BASED_OUTPATIENT_CLINIC_OR_DEPARTMENT_OTHER): Payer: Self-pay | Admitting: Physical Therapy

## 2024-08-23 NOTE — Therapy (Signed)
 " OUTPATIENT PHYSICAL THERAPY SHOULDER EVALUATION   Patient Name: Kyle Lawson MRN: [id number] DOB:1956/04/19, 68 y.o., male Today's Date: 08/23/2024  END OF SESSION:  PT End of Session - 08/22/24 0906     Visit Number 2    Number of Visits 17    Date for Recertification  11/06/24    Authorization Type MCR and BCBS    Authorization Time Period 08/14/24 to 11/07/23    PT Start Time 0845    PT Stop Time 0928    PT Time Calculation (min) 43 min    Activity Tolerance Patient tolerated treatment well    Behavior During Therapy [facility] for tasks assessed/performed          Past Medical History:  Diagnosis Date   Arthritis    Gout    History of kidney stones    Hyperlipidemia    Hypertension    Thoracic aortic aneurysm 2022   stable 4cm by CTA 12/2022   Past Surgical History:  Procedure Laterality Date   Anterior Cervical Decompression Fusion  2019   BICEPT TENODESIS Right 02/19/2024   Procedure: SUBPECTORAL BICEPS TENODESIS AND COLLAGEN PATCH AUGMENTATION;  Surgeon: Genelle Standing, MD;  Location: [facility];  Service: Orthopedics;  Laterality: Right;   DISTAL CLAVICLE EXCISION Right 2020   Right Shoulder Distal Clavicle Excision   JOINT REPLACEMENT  2008, 2013   Bilateral hip replacements   LAMINECTOMY  1984   L4-L5   LAMINECTOMY  1996   L3-L4   LUMBAR FUSION  01/11/2021   may 2022- L3-L5   LUMBAR FUSION  09/29/2021   L2-L3   SHOULDER ARTHROSCOPY WITH ROTATOR CUFF REPAIR Right 02/19/2024   Procedure: RIGHT SHOULDER ARTHROSCOPY;  Surgeon: Genelle Standing, MD;  Location: [facility];  Service: Orthopedics;  Laterality: Right;   SHOULDER ARTHROSCOPY WITH ROTATOR CUFF REPAIR Left 07/30/2024   Procedure: ARTHROSCOPY, SHOULDER, WITH ROTATOR CUFF REPAIR;  Surgeon: Genelle Standing, MD;  Location:  [facility];  Service: Orthopedics;  Laterality: Left;  LEFT SHOULDER ARTHROSCOPY WITH ACROMIOPLASTY, BICEPS TENODESIS AND COLLAGEN PATCH  AUGMENTATION   SPINE SURGERY  2019, 2022, 2023   C4-C6 fusion, L2-L5 fusion   TOTAL HIP ARTHROPLASTY Left    left hip 09/2006   TOTAL HIP ARTHROPLASTY  09/14/2011   Right. Procedure: TOTAL HIP ARTHROPLASTY;  Surgeon: Toribio JULIANNA Chancy, MD;  Location: [facility] OR;  Service: Orthopedics;  Laterality: Right;   Patient Active Problem List   Diagnosis Date Noted   Nontraumatic incomplete tear of left rotator cuff 07/30/2024   Impingement syndrome of left shoulder 07/30/2024   Biceps tendinitis of left upper extremity 07/30/2024   Nontraumatic incomplete tear of right rotator cuff 02/19/2024   Fatty liver 05/20/2022   Essential hypertension 11/04/2021   Hyperlipidemia, unspecified 11/04/2021   Ascending aorta dilation 11/04/2021   Spinal stenosis of lumbar region 09/29/2021   HNP (herniated nucleus pulposus), lumbar 01/11/2021   Acromioclavicular joint pain 07/13/2019   Sternum pain 01/16/2019   Right anterior shoulder pain 11/08/2017   Neck pain 07/04/2017   Gout 02/05/2017   Lumbar spondylosis 01/19/2017   Acute bilateral low back pain with bilateral sciatica 01/19/2017    PCP: Katrinka Senior MD   REFERRING PROVIDER: Genelle Standing, MD for shoulder, [name], DO for HS   REFERRING DIAG: Diagnosis M75.42 (ICD-10-CM) - Impingement syndrome of left shoulder Diagnosis  M76.892 (ICD-10-CM) - Hamstring tendinitis of left thigh  S76.312A (ICD-10-CM) - Strain of left hamstring muscle, initial encounter  THERAPY DIAG:  Acute pain of left shoulder  Stiffness of left shoulder, not elsewhere classified  Muscle weakness (generalized)  Pain in left leg  Acute pain of right shoulder  Rationale for Evaluation and Treatment: Rehabilitation  ONSET DATE: s/p L shoulder arthroscopy with rotator cuff repair, SAD, biceps tenodesis 07/30/24  SUBJECTIVE:                                                                                                                                                                                       SUBJECTIVE STATEMENT:  The patient reports his shoulder does well during the day. At night he has significant pain. He has to find the right position.   Eval: Had this same surgery on the other side, saw [name] over at [facility] but schedules were busy and I hadn't been able get in to see her soon enough. First week after surgery was tough but its been feeling better, have been doing some basics. Surgeon took me out of the sling on Friday. Very active at baseline and looking forward to being able to get back into the gym. Doctor told me as 15# limit for lifting. Have been riding my bike a bit, saw Dr. Leonce and he diagnosed me with a badly sprained HS and gave me some exercises to try. HS has gotten better, took 3 weeks off from the bike and focused on walking instead of cycling/cut back on hiking.   Hand dominance: Right  PERTINENT HISTORY: See above   PAIN:  Are you having pain? Pain isn't bad, took ibuprofen  today and was on prednisone /no number given on NPRS  PRECAUTIONS: Shoulder  RED FLAGS: None   WEIGHT BEARING RESTRICTIONS: Yes NWB shoulder at eval   FALLS:  Has patient fallen in last 6 months? No  LIVING ENVIRONMENT: Lives with: lives with their family Lives in: House/apartment   OCCUPATION: Retired- [employer]   PLOF: Independent, Independent with basic ADLs, Independent with gait, and Independent with transfers  PATIENT GOALS: get use of arm back, be able to play golf in the spring, be able to exercise   NEXT MD VISIT:   OBJECTIVE:  Note: Objective measures were completed at Evaluation unless otherwise noted.    PATIENT SURVEYS:  PSFS: THE PATIENT SPECIFIC FUNCTIONAL SCALE  Place score of 0-10 (0 = unable to perform activity and 10 = able to perform activity at the same level as before injury or problem)  Activity Date: 08/14/24    Sleeping/finding good sleeping position 3    2. Getting non button  shirts on/off 2    3. Reaching to open car door  1  4. Gym (upper body) 0    Total Score 1.5      Total Score = Sum of activity scores/number of activities  Minimally Detectable Change: 3 points (for single activity); 2 points (for average score)  [employer] [facility] (nd). The Patient Specific Functional Scale . Retrieved from [email or web address]   COGNITION: Overall cognitive status: Within functional limits for tasks assessed     SENSATION: Not tested  POSTURE: Rounded shoulders, forward head   UPPER EXTREMITY ROM:   ROM  Right eval Left Eval supine   Shoulder flexion  PROM 110*, AAROM 110*  Shoulder extension    Shoulder abduction  PROM 90*  Shoulder adduction    Shoulder internal rotation    Shoulder external rotation  PROM 30* by his side   Elbow flexion    Elbow extension    Wrist flexion    Wrist extension    Wrist ulnar deviation    Wrist radial deviation    Wrist pronation    Wrist supination    (Blank rows = not tested)  UPPER EXTREMITY MMT:  MMT Right eval Left eval  Shoulder flexion    Shoulder extension    Shoulder abduction    Shoulder adduction    Shoulder internal rotation    Shoulder external rotation    Middle trapezius    Lower trapezius    Elbow flexion    Elbow extension    Wrist flexion    Wrist extension    Wrist ulnar deviation    Wrist radial deviation    Wrist pronation    Wrist supination    Grip strength (lbs)    (Blank rows = not tested)  MMT not tested at eval                                                                                                                              TREATMENT DATE:  08/22/2024  Manual: Trigger point release to upper trap and anterior shoulder  Grade 1 and II inferior and posterior glides  Tennis ball release to hamstrings   There-ex: Wand press 2 lbs 2x10  Wand flexion 3x10  SL ER 3x10  Supine ABC no weight 2x10    Neuro-re-ed:  Row to neutral 3x10 red  Shoulder extension 3x10   Reviewed and updated HEP    08/14/24  Eval, POC, HEP  All incisions C/D/I, well healed at this point with no signs of infection   ROM/stretching as appropriate, HEP review    PATIENT EDUCATION: Education details: exam findings, POC, HEP, biceps tenodesis precautions  Person educated: Patient Education method: Programmer, Multimedia, Demonstration, and Handouts Education comprehension: verbalized understanding, returned demonstration, and needs further education  HOME EXERCISE PROGRAM:  Oked him for supine aarom shoulder flexion, rows and shoulder extension to neutral with yellow TB plus the following  Access Code: [email or web address] URL: [email or web address] Date: 08/14/2024 Prepared by: Josette Rough  Exercises - Seated Shoulder External Rotation AAROM with  Cane and Hand in Neutral  - 1 x daily - 7 x weekly - 2 sets - 10 reps - 2 seconds  hold - Seated Shoulder Abduction Towel Slide at Table Top  - 1 x daily - 7 x weekly - 2 sets - 10 reps - 2 seconds  hold    ASSESSMENT:  CLINICAL IMPRESSION:  The patient tolerated treatment well. We worked on stability and motion exercises. We graded is exercises using RPE>. We kept him at a 3-4 today. We will advance him depending on his tolerance over the next few days. His ROM is doing very well. We did not address the hamstring today. His hamstring is progressing with the things he has. We did review a self release with a tennis ball.   Eval: Patient is a 68 y.o. M who was seen today for physical therapy evaluation and treatment for skilled PT care following L shoulder arthroscopy with rotator cuff repair, SAD, biceps tenodesis 07/30/24. Of note he did have an additional order placed for HS strain, we were only able to address his post-op shoulder today however will plan on working on HS pain as time/reasonable efforts to recover from shoulder surgery allow.    OBJECTIVE IMPAIRMENTS: decreased ROM, decreased strength, increased fascial restrictions, impaired UE functional use, postural dysfunction, and pain.   ACTIVITY LIMITATIONS: carrying, lifting, sleeping, bathing, toileting, dressing, reach over head, and hygiene/grooming  PARTICIPATION LIMITATIONS: meal prep, cleaning, laundry, driving, shopping, community activity, and yard work  PERSONAL FACTORS: Past/current experiences and Time since onset of injury/illness/exacerbation are also affecting patient's functional outcome.   REHAB POTENTIAL: Good  CLINICAL DECISION MAKING: Stable/uncomplicated  EVALUATION COMPLEXITY: Low   GOALS: Goals reviewed with patient? No  SHORT TERM GOALS: Target date: 09/25/2024    Patient will be compliant with appropriate progressive HEP        GOAL STATUS: Initial  2. L shoulder flexion and ABD A/PROM to be at least 150*         GOAL STATUS: Initial    3. L shoulder IR and IR A/PROM to be at least 60* at 90* ABD           GOAL STATUS: Initial    4. Will be more aware of posture with all functional tasks with use of ergonomic aides PRN/as desired      GOAL STATUS: Initial   5. Hamstring/LE pain PT evaluation to have been completed/appropriate goals written as time/shoulder surgery rehab allows GOAL STATUS: Initial    LONG TERM GOALS: Target date: 11/06/2024   MMT to have improved by at least one grade in all weak groups       GOAL STATUS: Initial    2. AROM to have normalized and will be pain free all planes of motion      GOAL STATUS: Initial    3. Pain to be no more than 2/10 with all functional tasks L shoulder      GOAL STATUS: Initial   4. Will be able to perform all functional household and work based tasks without increase from resting pain levels     GOAL STATUS: Initial   5. PSFS to have improved by at least 3 points to show improved QOL and subjective perception of condition      GOAL STATUS: Initial   6. Lower extremity  LTGs to have been further established and addressed as time/rehab from shoulder surgery allows  GOAL STATUS: Initial     PLAN:  PT FREQUENCY: 2x/week  PT DURATION: 12 weeks  PLANNED INTERVENTIONS: [id number]- PT Re-evaluation, 97750- Physical Performance Testing, 97110-Therapeutic exercises, 97530- Therapeutic activity, [id number]- Neuromuscular re-education, 97535- Self Care, [id number]- Manual therapy, [id number]- Gait training, [id number]- Aquatic Therapy, [id number]- Vasopneumatic device, [id number]- Ultrasound, and 97033- Ionotophoresis 4mg /ml Dexamethasone   PLAN FOR NEXT SESSION: progress post-op shoulder rehab as appropriate per protocol (biceps tenodesis protocol per op notes), evaluate and set goals for hamstring/LE pain when able. Pt reports he was told 15# lifting restriction. Eval and treat HS as time allows, prioritize post-op shoulder  Alm Don PT DPT 08/23/2024 8:41 AM  "

## 2024-08-27 ENCOUNTER — Encounter (HOSPITAL_BASED_OUTPATIENT_CLINIC_OR_DEPARTMENT_OTHER): Payer: Self-pay | Admitting: Physical Therapy

## 2024-08-27 ENCOUNTER — Ambulatory Visit (HOSPITAL_BASED_OUTPATIENT_CLINIC_OR_DEPARTMENT_OTHER): Admitting: Physical Therapy

## 2024-08-27 DIAGNOSIS — M25512 Pain in left shoulder: Secondary | ICD-10-CM

## 2024-08-27 DIAGNOSIS — M25612 Stiffness of left shoulder, not elsewhere classified: Secondary | ICD-10-CM

## 2024-08-27 DIAGNOSIS — M6281 Muscle weakness (generalized): Secondary | ICD-10-CM

## 2024-08-27 DIAGNOSIS — M79605 Pain in left leg: Secondary | ICD-10-CM

## 2024-08-27 DIAGNOSIS — M25511 Pain in right shoulder: Secondary | ICD-10-CM

## 2024-08-27 NOTE — Therapy (Signed)
 " OUTPATIENT PHYSICAL THERAPY SHOULDER TREATMENT    Patient Name: Kyle Lawson MRN: [id number] DOB:11-23-1955, 68 y.o., male Today's Date: 08/27/2024  END OF SESSION:  PT End of Session - 08/27/24 1152     Visit Number 3    Number of Visits 17    Date for Recertification  11/06/24    Authorization Type MCR and BCBS    Authorization Time Period 08/14/24 to 11/07/23    Progress Note Due on Visit 10    PT Start Time 1148    PT Stop Time 1227    PT Time Calculation (min) 39 min    Activity Tolerance Patient tolerated treatment well    Behavior During Therapy [facility] for tasks assessed/performed           Past Medical History:  Diagnosis Date   Arthritis    Gout    History of kidney stones    Hyperlipidemia    Hypertension    Thoracic aortic aneurysm 2022   stable 4cm by CTA 12/2022   Past Surgical History:  Procedure Laterality Date   Anterior Cervical Decompression Fusion  2019   BICEPT TENODESIS Right 02/19/2024   Procedure: SUBPECTORAL BICEPS TENODESIS AND COLLAGEN PATCH AUGMENTATION;  Surgeon: Genelle Standing, MD;  Location: [facility];  Service: Orthopedics;  Laterality: Right;   DISTAL CLAVICLE EXCISION Right 2020   Right Shoulder Distal Clavicle Excision   JOINT REPLACEMENT  2008, 2013   Bilateral hip replacements   LAMINECTOMY  1984   L4-L5   LAMINECTOMY  1996   L3-L4   LUMBAR FUSION  01/11/2021   may 2022- L3-L5   LUMBAR FUSION  09/29/2021   L2-L3   SHOULDER ARTHROSCOPY WITH ROTATOR CUFF REPAIR Right 02/19/2024   Procedure: RIGHT SHOULDER ARTHROSCOPY;  Surgeon: Genelle Standing, MD;  Location:  SURGERY CENTER;  Service: Orthopedics;  Laterality: Right;   SHOULDER ARTHROSCOPY WITH ROTATOR CUFF REPAIR Left 07/30/2024   Procedure: ARTHROSCOPY, SHOULDER, WITH ROTATOR CUFF REPAIR;  Surgeon: Genelle Standing, MD;  Location:  SURGERY CENTER;  Service: Orthopedics;  Laterality: Left;  LEFT SHOULDER ARTHROSCOPY WITH ACROMIOPLASTY, BICEPS  TENODESIS AND COLLAGEN PATCH AUGMENTATION   SPINE SURGERY  2019, 2022, 2023   C4-C6 fusion, L2-L5 fusion   TOTAL HIP ARTHROPLASTY Left    left hip 09/2006   TOTAL HIP ARTHROPLASTY  09/14/2011   Right. Procedure: TOTAL HIP ARTHROPLASTY;  Surgeon: Toribio JULIANNA Chancy, MD;  Location: [facility] OR;  Service: Orthopedics;  Laterality: Right;   Patient Active Problem List   Diagnosis Date Noted   Nontraumatic incomplete tear of left rotator cuff 07/30/2024   Impingement syndrome of left shoulder 07/30/2024   Biceps tendinitis of left upper extremity 07/30/2024   Nontraumatic incomplete tear of right rotator cuff 02/19/2024   Fatty liver 05/20/2022   Essential hypertension 11/04/2021   Hyperlipidemia, unspecified 11/04/2021   Ascending aorta dilation 11/04/2021   Spinal stenosis of lumbar region 09/29/2021   HNP (herniated nucleus pulposus), lumbar 01/11/2021   Acromioclavicular joint pain 07/13/2019   Sternum pain 01/16/2019   Right anterior shoulder pain 11/08/2017   Neck pain 07/04/2017   Gout 02/05/2017   Lumbar spondylosis 01/19/2017   Acute bilateral low back pain with bilateral sciatica 01/19/2017    PCP: Katrinka Senior MD   REFERRING PROVIDER: Genelle Standing, MD for shoulder, [name], DO for HS   REFERRING DIAG: Diagnosis M75.42 (ICD-10-CM) - Impingement syndrome of left shoulder Diagnosis  M76.892 (ICD-10-CM) - Hamstring tendinitis of left thigh  D23.687J (  ICD-10-CM) - Strain of left hamstring muscle, initial encounter    THERAPY DIAG:  Acute pain of left shoulder  Stiffness of left shoulder, not elsewhere classified  Muscle weakness (generalized)  Pain in left leg  Acute pain of right shoulder  Rationale for Evaluation and Treatment: Rehabilitation  ONSET DATE: s/p L shoulder arthroscopy with rotator cuff repair, SAD, biceps tenodesis 07/30/24  SUBJECTIVE:                                                                                                                                                                                       SUBJECTIVE STATEMENT:  Making some progress, still having night pains and aches. Had a massage yesterday, she stayed away from the front of my shoulder but everything else is sore from her working it. Hamstring is getting better with the basics from the MD, Deatrice and I talked about it last time.   Eval: Had this same surgery on the other side, saw [name] [facility] at [facility] but schedules were busy and I hadn't been able get in to see her soon enough. First week after surgery was tough but its been feeling better, have been doing some basics. Surgeon took me out of the sling on Friday. Very active at baseline and looking forward to being able to get back into the gym. Doctor told me as 15# limit for lifting. Have been riding my bike a bit, saw Dr. Leonce and he diagnosed me with a badly sprained HS and gave me some exercises to try. HS has gotten better, took 3 weeks off from the bike and focused on walking instead of cycling/cut back on hiking.   Hand dominance: Right  PERTINENT HISTORY: See above   PAIN:  Are you having pain? 2/10 L shoulder, tooth achy discomfort, night/sleep makes it worse and movement makes it better   PRECAUTIONS: Shoulder  RED FLAGS: None   WEIGHT BEARING RESTRICTIONS: Yes NWB shoulder at eval   FALLS:  Has patient fallen in last 6 months? No  LIVING ENVIRONMENT: Lives with: lives with their family Lives in: House/apartment   OCCUPATION: Retired- [employer]   PLOF: Independent, Independent with basic ADLs, Independent with gait, and Independent with transfers  PATIENT GOALS: get use of arm back, be able to play golf in the spring, be able to exercise   NEXT MD VISIT:   OBJECTIVE:  Note: Objective measures were completed at Evaluation unless otherwise noted.    PATIENT SURVEYS:  PSFS: THE PATIENT SPECIFIC FUNCTIONAL SCALE  Place score of 0-10 (0 = unable to perform  activity and 10 = able to perform activity at the same level as  before injury or problem)  Activity Date: 08/14/24    Sleeping/finding good sleeping position 3    2. Getting non button shirts on/off 2    3. Reaching to open car door  1    4. Gym (upper body) 0    Total Score 1.5      Total Score = Sum of activity scores/number of activities  Minimally Detectable Change: 3 points (for single activity); 2 points (for average score)  [employer] (nd). The Patient Specific Functional Scale . Retrieved from [email or web address]   COGNITION: Overall cognitive status: Within functional limits for tasks assessed     SENSATION: Not tested  POSTURE: Rounded shoulders, forward head   UPPER EXTREMITY ROM:   ROM  Right eval Left Eval supine  Left 08/27/24  Shoulder flexion  PROM 110*, AAROM 110* PROM approximately 130*, AAROM 132*  Shoulder extension     Shoulder abduction  PROM 90* PROM 140*, AAROM 136*  Shoulder adduction     Shoulder internal rotation     Shoulder external rotation  PROM 30* by his side  PROM 30* by his side, AAROM approximately 40* seated   Elbow flexion     Elbow extension     Wrist flexion     Wrist extension     Wrist ulnar deviation     Wrist radial deviation     Wrist pronation     Wrist supination     (Blank rows = not tested)  UPPER EXTREMITY MMT:  MMT Right eval Left eval  Shoulder flexion    Shoulder extension    Shoulder abduction    Shoulder adduction    Shoulder internal rotation    Shoulder external rotation    Middle trapezius    Lower trapezius    Elbow flexion    Elbow extension    Wrist flexion    Wrist extension    Wrist ulnar deviation    Wrist radial deviation    Wrist pronation    Wrist supination    Grip strength (lbs)    (Blank rows = not tested)  MMT not tested at eval                                                                                                                               TREATMENT DATE:   08/27/24  PROM/stretching to L shoulder ROM updates Supine AAROM shoulder flexion 1# WaTE x12  Supine AAROM shoulder ABD 1# WaTe x12 Seated ER AAROM with 1# WaTe x12  SA punch 0# x12 SA Alphabet x2 0# Supine shoulder flexion AROM x10 0#  Sidelying shoulder ABD elbow bent 0# x10  Sidelying shoulder ER x12 0# L-stretch 5x5 seconds  Wall slides for flexion x12 Scap retractions red TB to neutral with elbow x12 Shoulder extensions red TB to neutral x12    08/22/2024  Manual: Trigger point release to upper trap and anterior shoulder  Grade 1 and  II inferior and posterior glides  Tennis ball release to hamstrings   There-ex: Wand press 2 lbs 2x10  Wand flexion 3x10  SL ER 3x10  Supine ABC no weight 2x10   Neuro-re-ed:  Row to neutral 3x10 red  Shoulder extension 3x10   Reviewed and updated HEP    08/14/24  Eval, POC, HEP  All incisions C/D/I, well healed at this point with no signs of infection   ROM/stretching as appropriate, HEP review    PATIENT EDUCATION: Education details: exam findings, POC, HEP, biceps tenodesis precautions  Person educated: Patient Education method: Programmer, Multimedia, Demonstration, and Handouts Education comprehension: verbalized understanding, returned demonstration, and needs further education  HOME EXERCISE PROGRAM:  Oked him for supine aarom shoulder flexion, rows and shoulder extension to neutral with yellow TB plus the following  Access Code: [email or web address] URL: [email or web address] Date: 08/14/2024 Prepared by: Josette Rough  Exercises - Seated Shoulder External Rotation AAROM with Cane and Hand in Neutral  - 1 x daily - 7 x weekly - 2 sets - 10 reps - 2 seconds  hold - Seated Shoulder Abduction Towel Slide at Table Top  - 1 x daily - 7 x weekly - 2 sets - 10 reps - 2 seconds  hold    ASSESSMENT:  CLINICAL IMPRESSION:  Arrives doing well today- updated  measures as appropriate, he is making great progress towards all goals at this point. Will continue to progress him as tolerated as per protocol.     Eval: Patient is a 68 y.o. M who was seen today for physical therapy evaluation and treatment for skilled PT care following L shoulder arthroscopy with rotator cuff repair, SAD, biceps tenodesis 07/30/24. Of note he did have an additional order placed for HS strain, we were only able to address his post-op shoulder today however will plan on working on HS pain as time/reasonable efforts to recover from shoulder surgery allow.   OBJECTIVE IMPAIRMENTS: decreased ROM, decreased strength, increased fascial restrictions, impaired UE functional use, postural dysfunction, and pain.   ACTIVITY LIMITATIONS: carrying, lifting, sleeping, bathing, toileting, dressing, reach over head, and hygiene/grooming  PARTICIPATION LIMITATIONS: meal prep, cleaning, laundry, driving, shopping, community activity, and yard work  PERSONAL FACTORS: Past/current experiences and Time since onset of injury/illness/exacerbation are also affecting patient's functional outcome.   REHAB POTENTIAL: Good  CLINICAL DECISION MAKING: Stable/uncomplicated  EVALUATION COMPLEXITY: Low   GOALS: Goals reviewed with patient? No  SHORT TERM GOALS: Target date: 09/25/2024    Patient will be compliant with appropriate progressive HEP        GOAL STATUS: MET 08/27/24  2. L shoulder flexion and ABD A/PROM to be at least 150*         GOAL STATUS: ONGOING 08/27/24   3. L shoulder IR and IR A/PROM to be at least 60* at 90* ABD           GOAL STATUS: ONGOING 08/27/24   4. Will be more aware of posture with all functional tasks with use of ergonomic aides PRN/as desired      GOAL STATUS: ONGOING 08/27/24  5. Hamstring/LE pain PT evaluation to have been completed/appropriate goals written as time/shoulder surgery rehab allows GOAL STATUS: DEFERRED 08/27/24    LONG TERM GOALS:  Target date: 11/06/2024   MMT to have improved by at least one grade in all weak groups       GOAL STATUS: Initial    2. AROM to have normalized and will be pain free all planes of  motion      GOAL STATUS: Initial    3. Pain to be no more than 2/10 with all functional tasks L shoulder      GOAL STATUS: Initial   4. Will be able to perform all functional household and work based tasks without increase from resting pain levels     GOAL STATUS: Initial   5. PSFS to have improved by at least 3 points to show improved QOL and subjective perception of condition      GOAL STATUS: Initial   6. Lower extremity LTGs to have been further established and addressed as time/rehab from shoulder surgery allows  GOAL STATUS: Initial     PLAN:  PT FREQUENCY: 2x/week  PT DURATION: 12 weeks  PLANNED INTERVENTIONS: 97164- PT Re-evaluation, 97750- Physical Performance Testing, 97110-Therapeutic exercises, 97530- Therapeutic activity, [id number]- Neuromuscular re-education, 97535- Self Care, [id number]- Manual therapy, [id number]- Gait training, [id number]- Aquatic Therapy, 97016- Vasopneumatic device, [id number]- Ultrasound, and [id number]- Ionotophoresis 4mg /ml Dexamethasone   PLAN FOR NEXT SESSION: progress post-op shoulder rehab as appropriate per protocol (biceps tenodesis protocol per op notes), evaluate and set goals for hamstring/LE pain when able. Pt reports he was told 15# lifting restriction. Eval and treat HS as time allows, prioritize post-op shoulder   Josette Rough, PT, DPT 08/27/2024 12:27 PM   "

## 2024-08-30 ENCOUNTER — Encounter (HOSPITAL_BASED_OUTPATIENT_CLINIC_OR_DEPARTMENT_OTHER): Payer: Self-pay | Admitting: Physical Therapy

## 2024-08-30 ENCOUNTER — Ambulatory Visit (HOSPITAL_BASED_OUTPATIENT_CLINIC_OR_DEPARTMENT_OTHER): Admitting: Physical Therapy

## 2024-08-30 DIAGNOSIS — M25512 Pain in left shoulder: Secondary | ICD-10-CM | POA: Diagnosis not present

## 2024-08-30 DIAGNOSIS — M25612 Stiffness of left shoulder, not elsewhere classified: Secondary | ICD-10-CM

## 2024-08-30 DIAGNOSIS — M6281 Muscle weakness (generalized): Secondary | ICD-10-CM

## 2024-08-30 NOTE — Therapy (Signed)
 " OUTPATIENT PHYSICAL THERAPY SHOULDER TREATMENT    Patient Name: Kyle Lawson MRN: [id number] DOB:1956-06-21, 68 y.o., male Today's Date: 08/30/2024  END OF SESSION:  PT End of Session - 08/30/24 1152     Visit Number 4    Number of Visits 17    Date for Recertification  11/06/24    Authorization Type MCR and BCBS    Authorization Time Period 08/14/24 to 11/07/23    Activity Tolerance Patient tolerated treatment well    Behavior During Therapy [facility] for tasks assessed/performed           Past Medical History:  Diagnosis Date   Arthritis    Gout    History of kidney stones    Hyperlipidemia    Hypertension    Thoracic aortic aneurysm 2022   stable 4cm by CTA 12/2022   Past Surgical History:  Procedure Laterality Date   Anterior Cervical Decompression Fusion  2019   BICEPT TENODESIS Right 02/19/2024   Procedure: SUBPECTORAL BICEPS TENODESIS AND COLLAGEN PATCH AUGMENTATION;  Surgeon: Kyle Standing, MD;  Location: [facility];  Service: Orthopedics;  Laterality: Right;   DISTAL CLAVICLE EXCISION Right 2020   Right Shoulder Distal Clavicle Excision   JOINT REPLACEMENT  2008, 2013   Bilateral hip replacements   LAMINECTOMY  1984   L4-L5   LAMINECTOMY  1996   L3-L4   LUMBAR FUSION  01/11/2021   may 2022- L3-L5   LUMBAR FUSION  09/29/2021   L2-L3   SHOULDER ARTHROSCOPY WITH ROTATOR CUFF REPAIR Right 02/19/2024   Procedure: RIGHT SHOULDER ARTHROSCOPY;  Surgeon: Kyle Standing, MD;  Location: [facility];  Service: Orthopedics;  Laterality: Right;   SHOULDER ARTHROSCOPY WITH ROTATOR CUFF REPAIR Left 07/30/2024   Procedure: ARTHROSCOPY, SHOULDER, WITH ROTATOR CUFF REPAIR;  Surgeon: Kyle Standing, MD;  Location:  SURGERY CENTER;  Service: Orthopedics;  Laterality: Left;  LEFT SHOULDER ARTHROSCOPY WITH ACROMIOPLASTY, BICEPS TENODESIS AND COLLAGEN PATCH AUGMENTATION   SPINE SURGERY  2019, 2022, 2023   C4-C6 fusion, L2-L5 fusion   TOTAL  HIP ARTHROPLASTY Left    left hip 09/2006   TOTAL HIP ARTHROPLASTY  09/14/2011   Right. Procedure: TOTAL HIP ARTHROPLASTY;  Surgeon: Toribio JULIANNA Chancy, MD;  Location: [facility] OR;  Service: Orthopedics;  Laterality: Right;   Patient Active Problem List   Diagnosis Date Noted   Nontraumatic incomplete tear of left rotator cuff 07/30/2024   Impingement syndrome of left shoulder 07/30/2024   Biceps tendinitis of left upper extremity 07/30/2024   Nontraumatic incomplete tear of right rotator cuff 02/19/2024   Fatty liver 05/20/2022   Essential hypertension 11/04/2021   Hyperlipidemia, unspecified 11/04/2021   Ascending aorta dilation 11/04/2021   Spinal stenosis of lumbar region 09/29/2021   HNP (herniated nucleus pulposus), lumbar 01/11/2021   Acromioclavicular joint pain 07/13/2019   Sternum pain 01/16/2019   Right anterior shoulder pain 11/08/2017   Neck pain 07/04/2017   Gout 02/05/2017   Lumbar spondylosis 01/19/2017   Acute bilateral low back pain with bilateral sciatica 01/19/2017    PCP: Kyle Senior MD   REFERRING PROVIDER: Genelle Standing, MD for shoulder, Kyle Katz, DO for HS   REFERRING DIAG: Diagnosis M75.42 (ICD-10-CM) - Impingement syndrome of left shoulder Diagnosis  M76.892 (ICD-10-CM) - Hamstring tendinitis of left thigh  S76.312A (ICD-10-CM) - Strain of left hamstring muscle, initial encounter    THERAPY DIAG:  No diagnosis found.  Rationale for Evaluation and Treatment: Rehabilitation  ONSET DATE: s/p L shoulder arthroscopy  with rotator cuff repair, SAD, biceps tenodesis 07/30/24  SUBJECTIVE:                                                                                                                                                                                      SUBJECTIVE STATEMENT:  Still having pain at night. Overall feels like the pain is decreasing at night  Eval: Had this same surgery on the other side, saw Kyle Lawson over at [facility]  but schedules were busy and I hadn't been able get in to see her soon enough. First week after surgery was tough but its been feeling better, have been doing some basics. Surgeon took me out of the sling on Friday. Very active at baseline and looking forward to being able to get back into the gym. Doctor told me as 15# limit for lifting. Have been riding my bike a bit, saw Dr. Leonce and he diagnosed me with a badly sprained HS and gave me some exercises to try. HS has gotten better, took 3 weeks off from the bike and focused on walking instead of cycling/cut back on hiking.   Hand dominance: Right  PERTINENT HISTORY: See above   PAIN:  Are you having pain? 2/10 L shoulder, tooth achy discomfort, night/sleep makes it worse and movement makes it better   PRECAUTIONS: Shoulder  RED FLAGS: None   WEIGHT BEARING RESTRICTIONS: Yes NWB shoulder at eval   FALLS:  Has patient fallen in last 6 months? No  LIVING ENVIRONMENT: Lives with: lives with their family Lives in: House/apartment   OCCUPATION: Retired- [employer]   PLOF: Independent, Independent with basic ADLs, Independent with gait, and Independent with transfers  PATIENT GOALS: get use of arm back, be able to play golf in the spring, be able to exercise   NEXT MD VISIT:   OBJECTIVE:  Note: Objective measures were completed at Evaluation unless otherwise noted.    PATIENT SURVEYS:  PSFS: THE PATIENT SPECIFIC FUNCTIONAL SCALE  Place score of 0-10 (0 = unable to perform activity and 10 = able to perform activity at the same level as before injury or problem)  Activity Date: 08/14/24    Sleeping/finding good sleeping position 3    2. Getting non button shirts on/off 2    3. Reaching to open car door  1    4. Gym (upper body) 0    Total Score 1.5      Total Score = Sum of activity scores/number of activities  Minimally Detectable Change: 3 points (for single activity); 2 points (for average score)  Kyle Lawson  [employer] (nd). The Patient Specific Functional Scale . Retrieved from  [email or web address]   COGNITION: Overall cognitive status: Within functional limits for tasks assessed     SENSATION: Not tested  POSTURE: Rounded shoulders, forward head   UPPER EXTREMITY ROM:   ROM  Right eval Left Eval supine  Left 08/27/24  Shoulder flexion  PROM 110*, AAROM 110* PROM approximately 130*, AAROM 132*  Shoulder extension     Shoulder abduction  PROM 90* PROM 140*, AAROM 136*  Shoulder adduction     Shoulder internal rotation     Shoulder external rotation  PROM 30* by his side  PROM 30* by his side, AAROM approximately 40* seated   Elbow flexion     Elbow extension     Wrist flexion     Wrist extension     Wrist ulnar deviation     Wrist radial deviation     Wrist pronation     Wrist supination     (Blank rows = not tested)  UPPER EXTREMITY MMT:  MMT Right eval Left eval  Shoulder flexion    Shoulder extension    Shoulder abduction    Shoulder adduction    Shoulder internal rotation    Shoulder external rotation    Middle trapezius    Lower trapezius    Elbow flexion    Elbow extension    Wrist flexion    Wrist extension    Wrist ulnar deviation    Wrist radial deviation    Wrist pronation    Wrist supination    Grip strength (lbs)    (Blank rows = not tested)  MMT not tested at eval                                                                                                                              TREATMENT DATE:  12/26 Manual: Trigger point release to upper trap and anterior shoulder  Grade 1 and II inferior and posterior glides  Tennis ball release to hamstrings   There-ex: Wand press 2 lbs 2x10  Wand flexion wlbs  2x12  SL ER 2x15 1 lb  Supine ABC  2lbs  2x10 RPE of 5   Neuro-re-ed:  Row to neutral x10 red low RPE cream 2 x 10 RPE of 4 Triceps extension: Patient would like to start performing  as part of his home exercise program.  Therapy educated him on the importance of not going past neutral.  Patient tolerated well.  3 x 10 with low RPE with green band.  Patient also advised he can do at the gym with cables just keep his weights low reps high Shoulder extension 3x10 red kept at low RPE  Reviewed and updated HEP   08/27/24  PROM/stretching to L shoulder ROM updates Supine AAROM shoulder flexion 1# WaTE x12  Supine AAROM shoulder ABD 1# WaTe x12 Seated ER AAROM with 1# WaTe x12  SA punch 0# x12 SA Alphabet x2 0# Supine shoulder flexion AROM x10 0#  Sidelying shoulder  ABD elbow bent 0# x10  Sidelying shoulder ER x12 0# L-stretch 5x5 seconds  Wall slides for flexion x12 Scap retractions red TB to neutral with elbow x12 Shoulder extensions red TB to neutral x12    08/22/2024  Manual: Trigger point release to upper trap and anterior shoulder  Grade 1 and II inferior and posterior glides  Tennis ball release to hamstrings   There-ex: Wand press 2 lbs 2x10  Wand flexion 3x10  SL ER 3x10  Supine ABC no weight 2x10   Neuro-re-ed:  Row to neutral 3x10 red  Shoulder extension 3x10   Reviewed and updated HEP    08/14/24  Eval, POC, HEP  All incisions C/D/I, well healed at this point with no signs of infection   ROM/stretching as appropriate, HEP review    PATIENT EDUCATION: Education details: exam findings, POC, HEP, biceps tenodesis precautions  Person educated: Patient Education method: Programmer, Multimedia, Demonstration, and Handouts Education comprehension: verbalized understanding, returned demonstration, and needs further education  HOME EXERCISE PROGRAM:  Oked him for supine aarom shoulder flexion, rows and shoulder extension to neutral with yellow TB plus the following  Access Code: [email or web address] URL: [email or web address] Date: 08/14/2024 Prepared by: Josette Rough  Exercises - Seated Shoulder External Rotation AAROM with Cane and Hand  in Neutral  - 1 x daily - 7 x weekly - 2 sets - 10 reps - 2 seconds  hold - Seated Shoulder Abduction Towel Slide at Table Top  - 1 x daily - 7 x weekly - 2 sets - 10 reps - 2 seconds  hold    ASSESSMENT:  CLINICAL IMPRESSION: Patient continues to have some pain at night.  Overall he is progressing well.  His range of motion is progressing as we would expect.  He had no irritation with current exercises but was advised that sometimes he will feel more pain at night we did load his exercises using RPE today.  We kept his RPE low.  He is advised he can progress to sets and reps if he feels like exercise or getting too easy.  Patient is progressing well.  Eval: Patient is a 68 y.o. M who was seen today for physical therapy evaluation and treatment for skilled PT care following L shoulder arthroscopy with rotator cuff repair, SAD, biceps tenodesis 07/30/24. Of note he did have an additional order placed for HS strain, we were only able to address his post-op shoulder today however will plan on working on HS pain as time/reasonable efforts to recover from shoulder surgery allow.   OBJECTIVE IMPAIRMENTS: decreased ROM, decreased strength, increased fascial restrictions, impaired UE functional use, postural dysfunction, and pain.   ACTIVITY LIMITATIONS: carrying, lifting, sleeping, bathing, toileting, dressing, reach over head, and hygiene/grooming  PARTICIPATION LIMITATIONS: meal prep, cleaning, laundry, driving, shopping, community activity, and yard work  PERSONAL FACTORS: Past/current experiences and Time since onset of injury/illness/exacerbation are also affecting patient's functional outcome.   REHAB POTENTIAL: Good  CLINICAL DECISION MAKING: Stable/uncomplicated  EVALUATION COMPLEXITY: Low   GOALS: Goals reviewed with patient? No  SHORT TERM GOALS: Target date: 09/25/2024    Patient will be compliant with appropriate progressive HEP        GOAL STATUS: MET 08/27/24  2. L shoulder  flexion and ABD A/PROM to be at least 150*         GOAL STATUS: ONGOING 08/27/24   3. L shoulder IR and IR A/PROM to be at least 60* at 90* ABD  GOAL STATUS: ONGOING 08/27/24   4. Will be more aware of posture with all functional tasks with use of ergonomic aides PRN/as desired      GOAL STATUS: ONGOING 08/27/24  5. Hamstring/LE pain PT evaluation to have been completed/appropriate goals written as time/shoulder surgery rehab allows GOAL STATUS: DEFERRED 08/27/24    LONG TERM GOALS: Target date: 11/06/2024   MMT to have improved by at least one grade in all weak groups       GOAL STATUS: Initial    2. AROM to have normalized and will be pain free all planes of motion      GOAL STATUS: Initial    3. Pain to be no more than 2/10 with all functional tasks L shoulder      GOAL STATUS: Initial   4. Will be able to perform all functional household and work based tasks without increase from resting pain levels     GOAL STATUS: Initial   5. PSFS to have improved by at least 3 points to show improved QOL and subjective perception of condition      GOAL STATUS: Initial   6. Lower extremity LTGs to have been further established and addressed as time/rehab from shoulder surgery allows  GOAL STATUS: Initial     PLAN:  PT FREQUENCY: 2x/week  PT DURATION: 12 weeks  PLANNED INTERVENTIONS: 97164- PT Re-evaluation, 97750- Physical Performance Testing, 97110-Therapeutic exercises, 97530- Therapeutic activity, [id number]- Neuromuscular re-education, 97535- Self Care, [id number]- Manual therapy, [id number]- Gait training, [id number]- Aquatic Therapy, [id number]- Vasopneumatic device, [id number]- Ultrasound, and [id number]- Ionotophoresis 4mg /ml Dexamethasone   PLAN FOR NEXT SESSION: progress post-op shoulder rehab as appropriate per protocol (biceps tenodesis protocol per op notes), evaluate and set goals for hamstring/LE pain when able. Pt reports he was told 15# lifting restriction. Eval and treat HS as time  allows, prioritize post-op shoulder   Josette Rough, PT, DPT 08/30/2024 12:15 PM   "

## 2024-09-02 ENCOUNTER — Encounter (HOSPITAL_BASED_OUTPATIENT_CLINIC_OR_DEPARTMENT_OTHER): Payer: Self-pay | Admitting: Physical Therapy

## 2024-09-02 ENCOUNTER — Ambulatory Visit (HOSPITAL_BASED_OUTPATIENT_CLINIC_OR_DEPARTMENT_OTHER): Admitting: Physical Therapy

## 2024-09-02 DIAGNOSIS — M6281 Muscle weakness (generalized): Secondary | ICD-10-CM

## 2024-09-02 DIAGNOSIS — M25512 Pain in left shoulder: Secondary | ICD-10-CM

## 2024-09-02 DIAGNOSIS — M25612 Stiffness of left shoulder, not elsewhere classified: Secondary | ICD-10-CM

## 2024-09-02 DIAGNOSIS — M25511 Pain in right shoulder: Secondary | ICD-10-CM

## 2024-09-02 DIAGNOSIS — M79605 Pain in left leg: Secondary | ICD-10-CM

## 2024-09-02 NOTE — Therapy (Signed)
 " OUTPATIENT PHYSICAL THERAPY SHOULDER TREATMENT    Patient Name: Kyle Lawson MRN: [id number] DOB:[birth year], 68 y.o., male Today's Date: 09/02/2024  END OF SESSION:  PT End of Session - 09/02/24 0825     Visit Number 5    Number of Visits 17    Date for Recertification  11/06/24    Authorization Type MCR and BCBS    Authorization Time Period 08/14/24 to 11/07/23    Progress Note Due on Visit 10    PT Start Time 0802    PT Stop Time 0843    PT Time Calculation (min) 41 min    Activity Tolerance Patient tolerated treatment well    Behavior During Therapy [facility] for tasks assessed/performed            Past Medical History:  Diagnosis Date   Arthritis    Gout    History of kidney stones    Hyperlipidemia    Hypertension    Thoracic aortic aneurysm 2022   stable 4cm by CTA 12/2022   Past Surgical History:  Procedure Laterality Date   Anterior Cervical Decompression Fusion  2019   BICEPT TENODESIS Right 02/19/2024   Procedure: SUBPECTORAL BICEPS TENODESIS AND COLLAGEN PATCH AUGMENTATION;  Surgeon: Genelle Standing, MD;  Location: [facility];  Service: Orthopedics;  Laterality: Right;   DISTAL CLAVICLE EXCISION Right 2020   Right Shoulder Distal Clavicle Excision   JOINT REPLACEMENT  2008, 2013   Bilateral hip replacements   LAMINECTOMY  1984   L4-L5   LAMINECTOMY  1996   L3-L4   LUMBAR FUSION  01/11/2021   may 2022- L3-L5   LUMBAR FUSION  09/29/2021   L2-L3   SHOULDER ARTHROSCOPY WITH ROTATOR CUFF REPAIR Right 02/19/2024   Procedure: RIGHT SHOULDER ARTHROSCOPY;  Surgeon: Genelle Standing, MD;  Location: [facility];  Service: Orthopedics;  Laterality: Right;   SHOULDER ARTHROSCOPY WITH ROTATOR CUFF REPAIR Left 07/30/2024   Procedure: ARTHROSCOPY, SHOULDER, WITH ROTATOR CUFF REPAIR;  Surgeon: Genelle Standing, MD;  Location: [facility];  Service: Orthopedics;  Laterality: Left;  LEFT SHOULDER ARTHROSCOPY WITH ACROMIOPLASTY, BICEPS  TENODESIS AND COLLAGEN PATCH AUGMENTATION   SPINE SURGERY  2019, 2022, 2023   C4-C6 fusion, L2-L5 fusion   TOTAL HIP ARTHROPLASTY Left    left hip 09/2006   TOTAL HIP ARTHROPLASTY  09/14/2011   Right. Procedure: TOTAL HIP ARTHROPLASTY;  Surgeon: Toribio JULIANNA Chancy, MD;  Location: [facility];  Service: Orthopedics;  Laterality: Right;   Patient Active Problem List   Diagnosis Date Noted   Nontraumatic incomplete tear of left rotator cuff 07/30/2024   Impingement syndrome of left shoulder 07/30/2024   Biceps tendinitis of left upper extremity 07/30/2024   Nontraumatic incomplete tear of right rotator cuff 02/19/2024   Fatty liver 05/20/2022   Essential hypertension 11/04/2021   Hyperlipidemia, unspecified 11/04/2021   Ascending aorta dilation 11/04/2021   Spinal stenosis of lumbar region 09/29/2021   HNP (herniated nucleus pulposus), lumbar 01/11/2021   Acromioclavicular joint pain 07/13/2019   Sternum pain 01/16/2019   Right anterior shoulder pain 11/08/2017   Neck pain 07/04/2017   Gout 02/05/2017   Lumbar spondylosis 01/19/2017   Acute bilateral low back pain with bilateral sciatica 01/19/2017    PCP: Katrinka Senior MD   REFERRING PROVIDER: Genelle Standing, MD for shoulder, [name], DO for HS   REFERRING DIAG: Diagnosis M75.42 (ICD-10-CM) - Impingement syndrome of left shoulder Diagnosis  M76.892 (ICD-10-CM) - Hamstring tendinitis of left thigh  D23.687J (ICD-10-CM) - Strain of left hamstring muscle, initial encounter    THERAPY DIAG:  Acute pain of left shoulder  Stiffness of left shoulder, not elsewhere classified  Muscle weakness (generalized)  Pain in left leg  Acute pain of right shoulder  Rationale for Evaluation and Treatment: Rehabilitation  ONSET DATE: s/p L shoulder arthroscopy with rotator cuff repair, SAD, biceps tenodesis 07/30/24  SUBJECTIVE:                                                                                                                                                                                       SUBJECTIVE STATEMENT:  Making some slow but steady progress, had a better night in terms of pain last night. Was a little sore after PT last time but in a good way.    Eval: Had this same surgery on the other side, saw [name] over at [facility] but schedules were busy and I hadn't been able get in to see her soon enough. First week after surgery was tough but its been feeling better, have been doing some basics. Surgeon took me out of the sling on Friday. Very active at baseline and looking forward to being able to get back into the gym. Doctor told me as 15# limit for lifting. Have been riding my bike a bit, saw Dr. Leonce and he diagnosed me with a badly sprained HS and gave me some exercises to try. HS has gotten better, took 3 weeks off from the bike and focused on walking instead of cycling/cut back on hiking.   Hand dominance: Right  PERTINENT HISTORY: See above   PAIN:  Are you having pain? 1.5/10 L shoulder, tooth achy discomfort and soreness, night/sleep makes it worse and movement makes it better   PRECAUTIONS: Shoulder  RED FLAGS: None   WEIGHT BEARING RESTRICTIONS: Yes NWB shoulder at eval   FALLS:  Has patient fallen in last 6 months? No  LIVING ENVIRONMENT: Lives with: lives with their family Lives in: House/apartment   OCCUPATION: Retired- [employer]   PLOF: Independent, Independent with basic ADLs, Independent with gait, and Independent with transfers  PATIENT GOALS: get use of arm back, be able to play golf in the spring, be able to exercise   NEXT MD VISIT:   OBJECTIVE:  Note: Objective measures were completed at Evaluation unless otherwise noted.    PATIENT SURVEYS:  PSFS: THE PATIENT SPECIFIC FUNCTIONAL SCALE  Place score of 0-10 (0 = unable to perform activity and 10 = able to perform activity at the same level as before injury or problem)  Activity Date: 08/14/24     Sleeping/finding good sleeping position 3  2. Getting non button shirts on/off 2    3. Reaching to open car door  1    4. Gym (upper body) 0    Total Score 1.5      Total Score = Sum of activity scores/number of activities  Minimally Detectable Change: 3 points (for single activity); 2 points (for average score)  [facility] (nd). The Patient Specific Functional Scale . Retrieved from [email or web address]   COGNITION: Overall cognitive status: Within functional limits for tasks assessed     SENSATION: Not tested  POSTURE: Rounded shoulders, forward head   UPPER EXTREMITY ROM:   ROM  Right eval Left Eval supine  Left 08/27/24 Left 09/02/24  Shoulder flexion  PROM 110*, AAROM 110* PROM approximately 130*, AAROM 132* AROM 140* supine   Shoulder extension      Shoulder abduction  PROM 90* PROM 140*, AAROM 136* AROM supine 141*  Shoulder adduction      Shoulder internal rotation      Shoulder external rotation  PROM 30* by his side  PROM 30* by his side, AAROM approximately 40* seated  At 70* ABD, 35* ER   Elbow flexion      Elbow extension      Wrist flexion      Wrist extension      Wrist ulnar deviation      Wrist radial deviation      Wrist pronation      Wrist supination      (Blank rows = not tested)  UPPER EXTREMITY MMT:  MMT Right eval Left eval  Shoulder flexion    Shoulder extension    Shoulder abduction    Shoulder adduction    Shoulder internal rotation    Shoulder external rotation    Middle trapezius    Lower trapezius    Elbow flexion    Elbow extension    Wrist flexion    Wrist extension    Wrist ulnar deviation    Wrist radial deviation    Wrist pronation    Wrist supination    Grip strength (lbs)    (Blank rows = not tested)  MMT not tested at eval                                                                                                                               TREATMENT DATE:   09/02/24  PROM/stretching to L shoulder ROM updates  Supine shoulder flexion 1# x12  Supine chest press 3# Wate x12 Sidelying shoulder ABD 1# x10 (to about shoulder height) Sidelying shoulder ER 1#x12 Wall ladder flexion x12 0# on wrist, cues for eccentric lower  Star gazer stretch 2x30 seconds supine  Sleeper stretch 2x30 stretch sidelying   Discussed HS- education on POC to formally evaluate it next visit, also differences between tendonitis and mm belly strain. Recommended still avoiding resistance machines in gym for another couple of weeks to allow for more healing  time for soft tissues/avoiding overloading biceps     12/26 Manual: Trigger point release to upper trap and anterior shoulder  Grade 1 and II inferior and posterior glides  Tennis ball release to hamstrings   There-ex: Wand press 2 lbs 2x10  Wand flexion wlbs  2x12  SL ER 2x15 1 lb  Supine ABC  2lbs  2x10 RPE of 5   Neuro-re-ed:  Row to neutral x10 red low RPE cream 2 x 10 RPE of 4 Triceps extension: Patient would like to start performing as part of his home exercise program.  Therapy educated him on the importance of not going past neutral.  Patient tolerated well.  3 x 10 with low RPE with green band.  Patient also advised he can do at the gym with cables just keep his weights low reps high Shoulder extension 3x10 red kept at low RPE  Reviewed and updated HEP   08/27/24  PROM/stretching to L shoulder ROM updates Supine AAROM shoulder flexion 1# WaTE x12  Supine AAROM shoulder ABD 1# WaTe x12 Seated ER AAROM with 1# WaTe x12  SA punch 0# x12 SA Alphabet x2 0# Supine shoulder flexion AROM x10 0#  Sidelying shoulder ABD elbow bent 0# x10  Sidelying shoulder ER x12 0# L-stretch 5x5 seconds  Wall slides for flexion x12 Scap retractions red TB to neutral with elbow x12 Shoulder extensions red TB to neutral x12    08/22/2024  Manual: Trigger point release to upper trap and  anterior shoulder  Grade 1 and II inferior and posterior glides  Tennis ball release to hamstrings   There-ex: Wand press 2 lbs 2x10  Wand flexion 3x10  SL ER 3x10  Supine ABC no weight 2x10   Neuro-re-ed:  Row to neutral 3x10 red  Shoulder extension 3x10   Reviewed and updated HEP    08/14/24  Eval, POC, HEP  All incisions C/D/I, well healed at this point with no signs of infection   ROM/stretching as appropriate, HEP review    PATIENT EDUCATION: Education details: exam findings, POC, HEP, biceps tenodesis precautions  Person educated: Patient Education method: Programmer, Multimedia, Demonstration, and Handouts Education comprehension: verbalized understanding, returned demonstration, and needs further education  HOME EXERCISE PROGRAM:  Oked him for supine aarom shoulder flexion, rows and shoulder extension to neutral with yellow TB plus the following  Access Code: [email or web address] URL: [email or web address] Date: 09/02/2024 Prepared by: Josette Rough  Exercises - Seated Shoulder External Rotation AAROM with Cane and Hand in Neutral  - 1 x daily - 7 x weekly - 2 sets - 10 reps - 2 seconds  hold - Seated Shoulder Abduction Towel Slide at Table Top  - 1 x daily - 7 x weekly - 2 sets - 10 reps - 2 seconds  hold - Supine Shoulder Abduction AROM  - 2-3 x daily - 7 x weekly - 1 sets - 10 reps - Supine Shoulder Flexion to 90  - 2-3 x daily - 7 x weekly - 1 sets - 10 reps - Sleeper Stretch  - 2-3 x daily - 7 x weekly - 1 sets - 3 reps - 30 seconds  hold - Supine Chest Stretch with Elbows Bent  - 2-3 x daily - 7 x weekly - 1 sets - 3 reps - 30 seconds  hold    ASSESSMENT:  CLINICAL IMPRESSION:  Arrives today doing well, we updated ROM- happy to see that his range of motion is steady improving. Unfortunately he continues to have some  HS pain, it has still been painful for him in terms of hiking and gym level activities. He requests that PT formally evaluate his HS next  visit- we are happy to go ahead with this plan.   Eval: Patient is a 68 y.o. M who was seen today for physical therapy evaluation and treatment for skilled PT care following L shoulder arthroscopy with rotator cuff repair, SAD, biceps tenodesis 07/30/24. Of note he did have an additional order placed for HS strain, we were only able to address his post-op shoulder today however will plan on working on HS pain as time/reasonable efforts to recover from shoulder surgery allow.   OBJECTIVE IMPAIRMENTS: decreased ROM, decreased strength, increased fascial restrictions, impaired UE functional use, postural dysfunction, and pain.   ACTIVITY LIMITATIONS: carrying, lifting, sleeping, bathing, toileting, dressing, reach over head, and hygiene/grooming  PARTICIPATION LIMITATIONS: meal prep, cleaning, laundry, driving, shopping, community activity, and yard work  PERSONAL FACTORS: Past/current experiences and Time since onset of injury/illness/exacerbation are also affecting patient's functional outcome.   REHAB POTENTIAL: Good  CLINICAL DECISION MAKING: Stable/uncomplicated  EVALUATION COMPLEXITY: Low   GOALS: Goals reviewed with patient? No  SHORT TERM GOALS: Target date: 09/25/2024    Patient will be compliant with appropriate progressive HEP        GOAL STATUS: MET 08/27/24  2. L shoulder flexion and ABD A/PROM to be at least 150*         GOAL STATUS: ONGOING 08/27/24   3. L shoulder IR and IR A/PROM to be at least 60* at 90* ABD           GOAL STATUS: ONGOING 08/27/24   4. Will be more aware of posture with all functional tasks with use of ergonomic aides PRN/as desired      GOAL STATUS: ONGOING 08/27/24  5. Hamstring/LE pain PT evaluation to have been completed/appropriate goals written as time/shoulder surgery rehab allows GOAL STATUS: DEFERRED 08/27/24    LONG TERM GOALS: Target date: 11/06/2024   MMT to have improved by at least one grade in all weak groups       GOAL  STATUS: Initial    2. AROM to have normalized and will be pain free all planes of motion      GOAL STATUS: Initial    3. Pain to be no more than 2/10 with all functional tasks L shoulder      GOAL STATUS: Initial   4. Will be able to perform all functional household and work based tasks without increase from resting pain levels     GOAL STATUS: Initial   5. PSFS to have improved by at least 3 points to show improved QOL and subjective perception of condition      GOAL STATUS: Initial   6. Lower extremity LTGs to have been further established and addressed as time/rehab from shoulder surgery allows  GOAL STATUS: Initial     PLAN:  PT FREQUENCY: 2x/week  PT DURATION: 12 weeks  PLANNED INTERVENTIONS: 97164- PT Re-evaluation, 97750- Physical Performance Testing, 97110-Therapeutic exercises, 97530- Therapeutic activity, [id number]- Neuromuscular re-education, 97535- Self Care, [id number]- Manual therapy, [id number]- Gait training, [id number]- Aquatic Therapy, 97016- Vasopneumatic device, [id number]- Ultrasound, and [id number]- Ionotophoresis 4mg /ml Dexamethasone   PLAN FOR NEXT SESSION: progress post-op shoulder rehab as appropriate per protocol (biceps tenodesis protocol per op notes), evaluate and set goals for hamstring/LE pain when able. Pt reports he was told 15# lifting restriction. Pt requesting formal hamstring eval next visit  Josette Rough, PT, DPT 09/02/2024  8:46 AM   "

## 2024-09-04 ENCOUNTER — Encounter (HOSPITAL_BASED_OUTPATIENT_CLINIC_OR_DEPARTMENT_OTHER): Payer: Self-pay | Admitting: Physical Therapy

## 2024-09-04 ENCOUNTER — Ambulatory Visit (HOSPITAL_BASED_OUTPATIENT_CLINIC_OR_DEPARTMENT_OTHER): Admitting: Physical Therapy

## 2024-09-04 DIAGNOSIS — M6281 Muscle weakness (generalized): Secondary | ICD-10-CM

## 2024-09-04 DIAGNOSIS — M79605 Pain in left leg: Secondary | ICD-10-CM

## 2024-09-04 DIAGNOSIS — M25612 Stiffness of left shoulder, not elsewhere classified: Secondary | ICD-10-CM

## 2024-09-04 DIAGNOSIS — M25512 Pain in left shoulder: Secondary | ICD-10-CM | POA: Diagnosis not present

## 2024-09-04 NOTE — Therapy (Signed)
 " OUTPATIENT PHYSICAL THERAPY SHOULDER TREATMENT/HAMSTRING EVAL   Patient Name: Kyle Lawson MRN: [id number] DOB:09/01/1956, 68 y.o., male Today's Date: 09/04/2024  END OF SESSION:  PT End of Session - 09/04/24 1354     Visit Number 6    Number of Visits 17    Date for Recertification  11/06/24    Authorization Type MCR and BCBS    Authorization Time Period 08/14/24 to 11/07/23    Progress Note Due on Visit 10    PT Start Time 1302    PT Stop Time 1344    PT Time Calculation (min) 42 min    Activity Tolerance Patient tolerated treatment well    Behavior During Therapy [facility] for tasks assessed/performed             Past Medical History:  Diagnosis Date   Arthritis    Gout    History of kidney stones    Hyperlipidemia    Hypertension    Thoracic aortic aneurysm 2022   stable 4cm by CTA 12/2022   Past Surgical History:  Procedure Laterality Date   Anterior Cervical Decompression Fusion  2019   BICEPT TENODESIS Right 02/19/2024   Procedure: SUBPECTORAL BICEPS TENODESIS AND COLLAGEN PATCH AUGMENTATION;  Surgeon: Genelle Standing, MD;  Location: [facility];  Service: Orthopedics;  Laterality: Right;   DISTAL CLAVICLE EXCISION Right 2020   Right Shoulder Distal Clavicle Excision   JOINT REPLACEMENT  2008, 2013   Bilateral hip replacements   LAMINECTOMY  1984   L4-L5   LAMINECTOMY  1996   L3-L4   LUMBAR FUSION  01/11/2021   may 2022- L3-L5   LUMBAR FUSION  09/29/2021   L2-L3   SHOULDER ARTHROSCOPY WITH ROTATOR CUFF REPAIR Right 02/19/2024   Procedure: RIGHT SHOULDER ARTHROSCOPY;  Surgeon: Genelle Standing, MD;  Location: [facility];  Service: Orthopedics;  Laterality: Right;   SHOULDER ARTHROSCOPY WITH ROTATOR CUFF REPAIR Left 07/30/2024   Procedure: ARTHROSCOPY, SHOULDER, WITH ROTATOR CUFF REPAIR;  Surgeon: Genelle Standing, MD;  Location: [facility];  Service: Orthopedics;  Laterality: Left;  LEFT SHOULDER ARTHROSCOPY WITH  ACROMIOPLASTY, BICEPS TENODESIS AND COLLAGEN PATCH AUGMENTATION   SPINE SURGERY  2019, 2022, 2023   C4-C6 fusion, L2-L5 fusion   TOTAL HIP ARTHROPLASTY Left    left hip 09/2006   TOTAL HIP ARTHROPLASTY  09/14/2011   Right. Procedure: TOTAL HIP ARTHROPLASTY;  Surgeon: Toribio JULIANNA Chancy, MD;  Location: [facility] OR;  Service: Orthopedics;  Laterality: Right;   Patient Active Problem List   Diagnosis Date Noted   Nontraumatic incomplete tear of left rotator cuff 07/30/2024   Impingement syndrome of left shoulder 07/30/2024   Biceps tendinitis of left upper extremity 07/30/2024   Nontraumatic incomplete tear of right rotator cuff 02/19/2024   Fatty liver 05/20/2022   Essential hypertension 11/04/2021   Hyperlipidemia, unspecified 11/04/2021   Ascending aorta dilation 11/04/2021   Spinal stenosis of lumbar region 09/29/2021   HNP (herniated nucleus pulposus), lumbar 01/11/2021   Acromioclavicular joint pain 07/13/2019   Sternum pain 01/16/2019   Right anterior shoulder pain 11/08/2017   Neck pain 07/04/2017   Gout 02/05/2017   Lumbar spondylosis 01/19/2017   Acute bilateral low back pain with bilateral sciatica 01/19/2017    PCP: Katrinka Senior MD   REFERRING PROVIDER: Genelle Standing, MD for shoulder, [name], DO for HS   REFERRING DIAG: Diagnosis M75.42 (ICD-10-CM) - Impingement syndrome of left shoulder Diagnosis  M76.892 (ICD-10-CM) - Hamstring tendinitis of left thigh  D23.687J (ICD-10-CM) - Strain of left hamstring muscle, initial encounter    THERAPY DIAG:  Acute pain of left shoulder  Stiffness of left shoulder, not elsewhere classified  Muscle weakness (generalized)  Pain in left leg  Rationale for Evaluation and Treatment: Rehabilitation  ONSET DATE: s/p L shoulder arthroscopy with rotator cuff repair, SAD, biceps tenodesis 07/30/24  SUBJECTIVE:                                                                                                                                                                                       SUBJECTIVE STATEMENT:  Still having a lot of shoulder pain at night more than the day, wanted to make sure it was ok at this point. HS has been fairly chronic, it got really bad around the first part of November, then I had surgery and I ignored the HS for awhile, then saw Dr. Leonce who diagnosed it as a strain but I'm concerned about a tendon issue. Having more discomfort at the attachment points behind the knee and at the sit bone. I think it may be an overuse issue from riding my exercise bike so much. When it was bad I could barely get up from a chair, it was both legs for awhile but R has gotten better while the L is still an issue. HS fatigues very quickly during hiking, have backed off on long hikes and time on exercise bike. Having a lot of pain behind L knee when I go to cross my legs and I've also noted some shin pain when I do this. Did a lot of research after our last conversation and interested in dry needling. Have numbness in my left foot that comes and goes, but it precedes HS issue- when L LE feels tight numbness is worse, when I have it loose numbness is better.    Eval: Had this same surgery on the other side, saw [name] [facility] at [facility] but schedules were busy and I hadn't been able get in to see her soon enough. First week after surgery was tough but its been feeling better, have been doing some basics. Surgeon took me out of the sling on Friday. Very active at baseline and looking forward to being able to get back into the gym. Doctor told me as 15# limit for lifting. Have been riding my bike a bit, saw Dr. Leonce and he diagnosed me with a badly sprained HS and gave me some exercises to try. HS has gotten better, took 3 weeks off from the bike and focused on walking instead of cycling/cut back on hiking.   Hand dominance: Right  PERTINENT HISTORY: See  above   PAIN:  Are you having pain? L shoulder  1-2/10 more sore/achey than pain,  movement makes it better and nights make it worse  L HS 4/-5/10, sit bone and all distal attachment sites but nothing in the belly of the muscle itself; pain is aching more than sharp; exacerbated by stairs, getting out of chairs, overuse; feels better when exercise load is reduced, stretching can help for a short time   PRECAUTIONS: Shoulder  RED FLAGS: None   WEIGHT BEARING RESTRICTIONS: Yes NWB shoulder at eval   FALLS:  Has patient fallen in last 6 months? No  LIVING ENVIRONMENT: Lives with: lives with their family Lives in: House/apartment   OCCUPATION: Retired- [employer]   PLOF: Independent, Independent with basic ADLs, Independent with gait, and Independent with transfers  PATIENT GOALS: get use of arm back, be able to play golf in the spring, be able to exercise   NEXT MD VISIT:   OBJECTIVE:  Note: Objective measures were completed at Evaluation unless otherwise noted.    PATIENT SURVEYS:  PSFS: THE PATIENT SPECIFIC FUNCTIONAL SCALE  Place score of 0-10 (0 = unable to perform activity and 10 = able to perform activity at the same level as before injury or problem)  Activity Date: 08/14/24    Sleeping/finding good sleeping position 3    2. Getting non button shirts on/off 2    3. Reaching to open car door  1    4. Gym (upper body) 0    Total Score 1.5      Total Score = Sum of activity scores/number of activities  Minimally Detectable Change: 3 points (for single activity); 2 points (for average score)  [facility] (nd). The Patient Specific Functional Scale . Retrieved from [email or web address]   09/04/24 LEFS- 47/80    COGNITION: Overall cognitive status: Within functional limits for tasks assessed     SENSATION: Not tested  POSTURE: Rounded shoulders, forward head   UPPER EXTREMITY ROM:   ROM  Right eval Left Eval supine  Left 08/27/24 Left  09/02/24  Shoulder flexion  PROM 110*, AAROM 110* PROM approximately 130*, AAROM 132* AROM 140* supine   Shoulder extension      Shoulder abduction  PROM 90* PROM 140*, AAROM 136* AROM supine 141*  Shoulder adduction      Shoulder internal rotation      Shoulder external rotation  PROM 30* by his side  PROM 30* by his side, AAROM approximately 40* seated  At 70* ABD, 35* ER   Elbow flexion      Elbow extension      Wrist flexion      Wrist extension      Wrist ulnar deviation      Wrist radial deviation      Wrist pronation      Wrist supination      (Blank rows = not tested)  UPPER EXTREMITY MMT:  MMT Right eval Left eval  Shoulder flexion    Shoulder extension    Shoulder abduction    Shoulder adduction    Shoulder internal rotation    Shoulder external rotation    Middle trapezius    Lower trapezius    Elbow flexion    Elbow extension    Wrist flexion    Wrist extension    Wrist ulnar deviation    Wrist radial deviation    Wrist pronation    Wrist supination    Grip strength (lbs)    (  Blank rows = not tested)  MMT not tested at eval   09/04/24  Quads: L 4+/5, R 5/5 Hams: L 3-/5, R 4+/5 Hip flexors: L 4+/5, R 5/5 Hip ABD:4+/5 B Hip extensors: L 3/5, R 3+/5  Muscle flexibility-  quads 50% limited B, HS 80% limited B in 90/90 test, L piriformis >90% limited and R about 75% limited  Noted leg length difference with L LE about 1/2 inch longer   Palpation- tender at ischial tuberosity, some tenderness laterally/proximally, no trigger points noted muscle belly                                                                                                                               TREATMENT DATE:    09/04/24  Subjective, LEFS, MMT, flexibility, skilled palpation of HS/hip region  Bridges x10 Lumbar rotation stretch 10x3 seconds alternating Prone HS curls red TB x8 keeping pain 4/10 or less   Education on findings, POC for his HS and new HEP     09/02/24  PROM/stretching to L shoulder ROM updates  Supine shoulder flexion 1# x12  Supine chest press 3# Wate x12 Sidelying shoulder ABD 1# x10 (to about shoulder height) Sidelying shoulder ER 1#x12 Wall ladder flexion x12 0# on wrist, cues for eccentric lower  Star gazer stretch 2x30 seconds supine  Sleeper stretch 2x30 stretch sidelying   Discussed HS- education on POC to formally evaluate it next visit, also differences between tendonitis and mm belly strain. Recommended still avoiding resistance machines in gym for another couple of weeks to allow for more healing time for soft tissues/avoiding overloading biceps     12/26 Manual: Trigger point release to upper trap and anterior shoulder  Grade 1 and II inferior and posterior glides  Tennis ball release to hamstrings   There-ex: Wand press 2 lbs 2x10  Wand flexion wlbs  2x12  SL ER 2x15 1 lb  Supine ABC  2lbs  2x10 RPE of 5   Neuro-re-ed:  Row to neutral x10 red low RPE cream 2 x 10 RPE of 4 Triceps extension: Patient would like to start performing as part of his home exercise program.  Therapy educated him on the importance of not going past neutral.  Patient tolerated well.  3 x 10 with low RPE with green band.  Patient also advised he can do at the gym with cables just keep his weights low reps high Shoulder extension 3x10 red kept at low RPE  Reviewed and updated HEP   08/27/24  PROM/stretching to L shoulder ROM updates Supine AAROM shoulder flexion 1# WaTE x12  Supine AAROM shoulder ABD 1# WaTe x12 Seated ER AAROM with 1# WaTe x12  SA punch 0# x12 SA Alphabet x2 0# Supine shoulder flexion AROM x10 0#  Sidelying shoulder ABD elbow bent 0# x10  Sidelying shoulder ER x12 0# L-stretch 5x5 seconds  Wall slides for flexion x12 Scap retractions red TB to neutral with  elbow x12 Shoulder extensions red TB to neutral x12    08/22/2024  Manual: Trigger point release to upper trap and anterior shoulder   Grade 1 and II inferior and posterior glides  Tennis ball release to hamstrings   There-ex: Wand press 2 lbs 2x10  Wand flexion 3x10  SL ER 3x10  Supine ABC no weight 2x10   Neuro-re-ed:  Row to neutral 3x10 red  Shoulder extension 3x10   Reviewed and updated HEP    08/14/24  Eval, POC, HEP  All incisions C/D/I, well healed at this point with no signs of infection   ROM/stretching as appropriate, HEP review    PATIENT EDUCATION: Education details: exam findings, POC, HEP, biceps tenodesis precautions  Person educated: Patient Education method: Programmer, Multimedia, Demonstration, and Handouts Education comprehension: verbalized understanding, returned demonstration, and needs further education  HOME EXERCISE PROGRAM:  Oked him for supine aarom shoulder flexion, rows and shoulder extension to neutral with yellow TB plus the following  Access Code: [email or web address] URL: [email or web address] Date: 09/02/2024 Prepared by: Josette Rough  Exercises - Seated Shoulder External Rotation AAROM with Cane and Hand in Neutral  - 1 x daily - 7 x weekly - 2 sets - 10 reps - 2 seconds  hold - Seated Shoulder Abduction Towel Slide at Table Top  - 1 x daily - 7 x weekly - 2 sets - 10 reps - 2 seconds  hold - Supine Shoulder Abduction AROM  - 2-3 x daily - 7 x weekly - 1 sets - 10 reps - Supine Shoulder Flexion to 90  - 2-3 x daily - 7 x weekly - 1 sets - 10 reps - Sleeper Stretch  - 2-3 x daily - 7 x weekly - 1 sets - 3 reps - 30 seconds  hold - Supine Chest Stretch with Elbows Bent  - 2-3 x daily - 7 x weekly - 1 sets - 3 reps - 30 seconds  hold   Hamstring HEP:  Access Code: [email or web address] URL: [email or web address] Date: 09/04/2024 Prepared by: Josette Rough     ASSESSMENT:  CLINICAL IMPRESSION:  Focused today's session on HS evaluation per patient request- of note, he does have bilateral THRs that were done over 10 years ago and have been doing well.  Objectives for LE measures as above. He does have a noted leg length difference which has also been identified before, might benefit from a formal heel lift placement. Demonstrates significant posterior chain weakness B as well as significant L LE generalized weakness. B hips very immobile and stiff. I do think that he likely has some tendon involvement contributing to HS pain- referring MD had given him 3 HS stretches to work on, I cut this down to one stretch and added some strengthening and mobility work instead. Anticipate that working on all these impairments will likely improve HS pain and assist in return to pain free gym exercise and hiking.    Eval: Patient is a 67 y.o. M who was seen today for physical therapy evaluation and treatment for skilled PT care following L shoulder arthroscopy with rotator cuff repair, SAD, biceps tenodesis 07/30/24. Of note he did have an additional order placed for HS strain, we were only able to address his post-op shoulder today however will plan on working on HS pain as time/reasonable efforts to recover from shoulder surgery allow.   OBJECTIVE IMPAIRMENTS: decreased ROM, decreased strength, increased fascial restrictions, impaired UE functional use, postural dysfunction, and pain.   ACTIVITY LIMITATIONS: carrying, lifting, sleeping,  bathing, toileting, dressing, reach over head, and hygiene/grooming  PARTICIPATION LIMITATIONS: meal prep, cleaning, laundry, driving, shopping, community activity, and yard work  PERSONAL FACTORS: Past/current experiences and Time since onset of injury/illness/exacerbation are also affecting patient's functional outcome.   REHAB POTENTIAL: Good  CLINICAL DECISION MAKING: Stable/uncomplicated  EVALUATION COMPLEXITY: Low   GOALS: Goals reviewed with patient? No  SHORT TERM GOALS: Target date: 09/25/2024    Patient will be compliant with appropriate progressive HEP        GOAL STATUS: MET 08/27/24  2. L shoulder  flexion and ABD A/PROM to be at least 150*         GOAL STATUS: ONGOING 08/27/24   3. L shoulder IR and IR A/PROM to be at least 60* at 90* ABD           GOAL STATUS: ONGOING 08/27/24   4. Will be more aware of posture with all functional tasks with use of ergonomic aides PRN/as desired      GOAL STATUS: ONGOING 08/27/24  5. Hamstring/LE pain PT evaluation to have been completed/appropriate goals written as time/shoulder surgery rehab allows GOAL STATUS: MET 09/04/24   LONG TERM GOALS: Target date: 11/06/2024   MMT to have improved by at least one grade in all weak groups       GOAL STATUS: Initial    2. AROM to have normalized and will be pain free all planes of motion      GOAL STATUS: Initial    3. Pain to be no more than 2/10 with all functional tasks L shoulder      GOAL STATUS: Initial   4. Will be able to perform all functional household and work based tasks without increase from resting pain levels     GOAL STATUS: Initial   5. PSFS to have improved by at least 3 points to show improved QOL and subjective perception of condition      GOAL STATUS: Initial   6. Lower extremity LTGs to have been further established and addressed as time/rehab from shoulder surgery allows  GOAL STATUS: MET 09/04/24 as below  7. L LE MMT to be 4+/5 without pain, R hip extensor strength also to be 4+/5 GOAL STATUS: NEW   8. B HS and piriformis flexibility to be no more than 50% limited  GOAL STATUS: NEW  9. Will be able to perform all functional mobility and transfers at home and in the community with L HS pain no more than 2/10 GOAL STATUS: NEW  10. Will have been able to return to desired gym and exercise based activities with L HS pain no more than 2/10 GOAL STATUS: NEW     PLAN:  PT FREQUENCY: 2x/week  PT DURATION: 12 weeks  PLANNED INTERVENTIONS: 97164- PT Re-evaluation, 97750- Physical Performance Testing, 97110-Therapeutic exercises, 97530- Therapeutic activity,  [id number]- Neuromuscular re-education, 97535- Self Care, [id number]- Manual therapy, [id number]- Gait training, [id number]- Aquatic Therapy, [id number]- Vasopneumatic device, [id number]- Ultrasound, and 02966- Ionotophoresis 4mg /ml Dexamethasone   PLAN FOR NEXT SESSION: Shoulder: progress post-op shoulder rehab as appropriate per protocol (biceps tenodesis protocol per op notes), evaluate and set goals for hamstring/LE pain when able. Pt reports he was told 15# lifting restriction.   Hamstring: work on lumbar mobility and gross hip mobility/flexibility, L LE and posterior chain strengthening. Manual as desired.  Consider heel lift    Josette Rough, PT, DPT 09/04/2024 1:54 PM   "

## 2024-09-09 ENCOUNTER — Ambulatory Visit (HOSPITAL_BASED_OUTPATIENT_CLINIC_OR_DEPARTMENT_OTHER): Attending: Orthopaedic Surgery | Admitting: Physical Therapy

## 2024-09-09 ENCOUNTER — Encounter (HOSPITAL_BASED_OUTPATIENT_CLINIC_OR_DEPARTMENT_OTHER): Payer: Self-pay | Admitting: Physical Therapy

## 2024-09-09 DIAGNOSIS — M79605 Pain in left leg: Secondary | ICD-10-CM | POA: Insufficient documentation

## 2024-09-09 DIAGNOSIS — M25612 Stiffness of left shoulder, not elsewhere classified: Secondary | ICD-10-CM | POA: Insufficient documentation

## 2024-09-09 DIAGNOSIS — M25511 Pain in right shoulder: Secondary | ICD-10-CM | POA: Insufficient documentation

## 2024-09-09 DIAGNOSIS — M6281 Muscle weakness (generalized): Secondary | ICD-10-CM | POA: Insufficient documentation

## 2024-09-09 DIAGNOSIS — M25512 Pain in left shoulder: Secondary | ICD-10-CM | POA: Insufficient documentation

## 2024-09-09 NOTE — Therapy (Signed)
 " OUTPATIENT PHYSICAL THERAPY SHOULDER TREATMENT/HAMSTRING EVAL   Patient Name: Kyle Lawson MRN: [id number] DOB:1956/02/23, 69 y.o., male Today's Date: 09/09/2024  END OF SESSION:  PT End of Session - 09/09/24 1451     Visit Number 7    Number of Visits 17    Date for Recertification  11/06/24    Authorization Type MCR and BCBS    Authorization Time Period 08/14/24 to 11/07/23    PT Start Time 1015    PT Stop Time 1058    PT Time Calculation (min) 43 min    Activity Tolerance Patient tolerated treatment well    Behavior During Therapy [facility] for tasks assessed/performed             Past Medical History:  Diagnosis Date   Arthritis    Gout    History of kidney stones    Hyperlipidemia    Hypertension    Thoracic aortic aneurysm 2022   stable 4cm by CTA 12/2022   Past Surgical History:  Procedure Laterality Date   Anterior Cervical Decompression Fusion  2019   BICEPT TENODESIS Right 02/19/2024   Procedure: SUBPECTORAL BICEPS TENODESIS AND COLLAGEN PATCH AUGMENTATION;  Surgeon: Genelle Standing, MD;  Location: [facility];  Service: Orthopedics;  Laterality: Right;   DISTAL CLAVICLE EXCISION Right 2020   Right Shoulder Distal Clavicle Excision   JOINT REPLACEMENT  2008, 2013   Bilateral hip replacements   LAMINECTOMY  1984   L4-L5   LAMINECTOMY  1996   L3-L4   LUMBAR FUSION  01/11/2021   may 2022- L3-L5   LUMBAR FUSION  09/29/2021   L2-L3   SHOULDER ARTHROSCOPY WITH ROTATOR CUFF REPAIR Right 02/19/2024   Procedure: RIGHT SHOULDER ARTHROSCOPY;  Surgeon: Genelle Standing, MD;  Location: [facility];  Service: Orthopedics;  Laterality: Right;   SHOULDER ARTHROSCOPY WITH ROTATOR CUFF REPAIR Left 07/30/2024   Procedure: ARTHROSCOPY, SHOULDER, WITH ROTATOR CUFF REPAIR;  Surgeon: Genelle Standing, MD;  Location: [facility];  Service: Orthopedics;  Laterality: Left;  LEFT SHOULDER ARTHROSCOPY WITH ACROMIOPLASTY, BICEPS TENODESIS AND  COLLAGEN PATCH AUGMENTATION   SPINE SURGERY  2019, 2022, 2023   C4-C6 fusion, L2-L5 fusion   TOTAL HIP ARTHROPLASTY Left    left hip 09/2006   TOTAL HIP ARTHROPLASTY  09/14/2011   Right. Procedure: TOTAL HIP ARTHROPLASTY;  Surgeon: Toribio JULIANNA Chancy, MD;  Location: [facility];  Service: Orthopedics;  Laterality: Right;   Patient Active Problem List   Diagnosis Date Noted   Nontraumatic incomplete tear of left rotator cuff 07/30/2024   Impingement syndrome of left shoulder 07/30/2024   Biceps tendinitis of left upper extremity 07/30/2024   Nontraumatic incomplete tear of right rotator cuff 02/19/2024   Fatty liver 05/20/2022   Essential hypertension 11/04/2021   Hyperlipidemia, unspecified 11/04/2021   Ascending aorta dilation 11/04/2021   Spinal stenosis of lumbar region 09/29/2021   HNP (herniated nucleus pulposus), lumbar 01/11/2021   Acromioclavicular joint pain 07/13/2019   Sternum pain 01/16/2019   Right anterior shoulder pain 11/08/2017   Neck pain 07/04/2017   Gout 02/05/2017   Lumbar spondylosis 01/19/2017   Acute bilateral low back pain with bilateral sciatica 01/19/2017    PCP: Katrinka Senior MD   REFERRING PROVIDER: Genelle Standing, MD for shoulder, [name], DO for HS   REFERRING DIAG: Diagnosis M75.42 (ICD-10-CM) - Impingement syndrome of left shoulder Diagnosis  M76.892 (ICD-10-CM) - Hamstring tendinitis of left thigh  S76.312A (ICD-10-CM) - Strain of left hamstring muscle,  initial encounter    THERAPY DIAG:  Acute pain of left shoulder  Stiffness of left shoulder, not elsewhere classified  Muscle weakness (generalized)  Pain in left leg  Acute pain of right shoulder  Rationale for Evaluation and Treatment: Rehabilitation  ONSET DATE: s/p L shoulder arthroscopy with rotator cuff repair, SAD, biceps tenodesis 07/30/24  SUBJECTIVE:                                                                                                                                                                                       SUBJECTIVE STATEMENT:  The patient reports the pain at night is improving. He had less when he didn't do his exercises. He was advised he can do his exercises 3-4 x a week as long as they are challenging.    Eval: Had this same surgery on the other side, saw [name] over at [facility] but schedules were busy and I hadn't been able get in to see her soon enough. First week after surgery was tough but its been feeling better, have been doing some basics. Surgeon took me out of the sling on Friday. Very active at baseline and looking forward to being able to get back into the gym. Doctor told me as 15# limit for lifting. Have been riding my bike a bit, saw Dr. Leonce and he diagnosed me with a badly sprained HS and gave me some exercises to try. HS has gotten better, took 3 weeks off from the bike and focused on walking instead of cycling/cut back on hiking.   Hand dominance: Right  PERTINENT HISTORY: See above   PAIN:  Are you having pain? L shoulder 1-2/10 more sore/achey than pain,  movement makes it better and nights make it worse  L HS 4/-5/10, sit bone and all distal attachment sites but nothing in the belly of the muscle itself; pain is aching more than sharp; exacerbated by stairs, getting out of chairs, overuse; feels better when exercise load is reduced, stretching can help for a short time   PRECAUTIONS: Shoulder  RED FLAGS: None   WEIGHT BEARING RESTRICTIONS: Yes NWB shoulder at eval   FALLS:  Has patient fallen in last 6 months? No  LIVING ENVIRONMENT: Lives with: lives with their family Lives in: House/apartment   OCCUPATION: Retired- [employer]   PLOF: Independent, Independent with basic ADLs, Independent with gait, and Independent with transfers  PATIENT GOALS: get use of arm back, be able to play golf in the spring, be able to exercise   NEXT MD VISIT:   OBJECTIVE:  Note: Objective measures were  completed at Evaluation unless otherwise noted.    PATIENT  SURVEYS:  PSFS: THE PATIENT SPECIFIC FUNCTIONAL SCALE  Place score of 0-10 (0 = unable to perform activity and 10 = able to perform activity at the same level as before injury or problem)  Activity Date: 08/14/24    Sleeping/finding good sleeping position 3    2. Getting non button shirts on/off 2    3. Reaching to open car door  1    4. Gym (upper body) 0    Total Score 1.5      Total Score = Sum of activity scores/number of activities  Minimally Detectable Change: 3 points (for single activity); 2 points (for average score)  [employer] [facility] (nd). The Patient Specific Functional Scale . Retrieved from [email or web address]   09/04/24 LEFS- 47/80    COGNITION: Overall cognitive status: Within functional limits for tasks assessed     SENSATION: Not tested  POSTURE: Rounded shoulders, forward head   UPPER EXTREMITY ROM:   ROM  Right eval Left Eval supine  Left 08/27/24 Left 09/02/24  Shoulder flexion  PROM 110*, AAROM 110* PROM approximately 130*, AAROM 132* AROM 140* supine   Shoulder extension      Shoulder abduction  PROM 90* PROM 140*, AAROM 136* AROM supine 141*  Shoulder adduction      Shoulder internal rotation      Shoulder external rotation  PROM 30* by his side  PROM 30* by his side, AAROM approximately 40* seated  At 70* ABD, 35* ER   Elbow flexion      Elbow extension      Wrist flexion      Wrist extension      Wrist ulnar deviation      Wrist radial deviation      Wrist pronation      Wrist supination      (Blank rows = not tested)  UPPER EXTREMITY MMT:  MMT Right eval Left eval  Shoulder flexion    Shoulder extension    Shoulder abduction    Shoulder adduction    Shoulder internal rotation    Shoulder external rotation    Middle trapezius    Lower trapezius    Elbow flexion    Elbow extension    Wrist flexion     Wrist extension    Wrist ulnar deviation    Wrist radial deviation    Wrist pronation    Wrist supination    Grip strength (lbs)    (Blank rows = not tested)  MMT not tested at eval   09/04/24  Quads: L 4+/5, R 5/5 Hams: L 3-/5, R 4+/5 Hip flexors: L 4+/5, R 5/5 Hip ABD:4+/5 B Hip extensors: L 3/5, R 3+/5  Muscle flexibility-  quads 50% limited B, HS 80% limited B in 90/90 test, L piriformis >90% limited and R about 75% limited  Noted leg length difference with L LE about 1/2 inch longer   Palpation- tender at ischial tuberosity, some tenderness laterally/proximally, no trigger points noted muscle belly  TREATMENT DATE:  1/5 Trigger Point Dry Needling  Initial Treatment: Pt instructed on Dry Needling rational, procedures, and possible side effects. Pt instructed to expect mild to moderate muscle soreness later in the day and/or into the next day.  Pt instructed in methods to reduce muscle soreness. Pt instructed to continue prescribed HEP. Because Dry Needling was performed over or adjacent to a lung field, pt was educated on S/S of pneumothorax and to seek immediate medical attention should they occur.  Patient was educated on signs and symptoms of infection and other risk factors and advised to seek medical attention should they occur.  Patient verbalized understanding of these instructions and education.   Patient Verbal Consent Given: Yes Education Handout Provided: Yes Muscles Treated: Needle left upper trap X3 using 0.30 X50 needle.  Needle to left glute max using a 0.30 X75 needle Electrical Stimulation Performed: No Treatment Response/Outcome: Great twitch response noted with all needles   Gait training: Assessed patient's leg length discrepancy.  Put 2 level heel shift in patient's shoe.  Reviewed patient's gait with his 2 level heel lift.   No significant antalgic gait noted.  Reviewed wearing schedule for heel lift.  Patient advised if he gets sore or painful in his hamstrings or hip to stop using heel lift to see if there is a  correlation.  Manual:  Skilled palpation of trigger points  Soft tissue mobilization to upper trap/gluteal/ and proximal hamstring insertion      09/04/24  Subjective, LEFS, MMT, flexibility, skilled palpation of HS/hip region  Bridges x10 Lumbar rotation stretch 10x3 seconds alternating Prone HS curls red TB x8 keeping pain 4/10 or less   Education on findings, POC for his HS and new HEP    09/02/24  PROM/stretching to L shoulder ROM updates  Supine shoulder flexion 1# x12  Supine chest press 3# Wate x12 Sidelying shoulder ABD 1# x10 (to about shoulder height) Sidelying shoulder ER 1#x12 Wall ladder flexion x12 0# on wrist, cues for eccentric lower  Star gazer stretch 2x30 seconds supine  Sleeper stretch 2x30 stretch sidelying   Discussed HS- education on POC to formally evaluate it next visit, also differences between tendonitis and mm belly strain. Recommended still avoiding resistance machines in gym for another couple of weeks to allow for more healing time for soft tissues/avoiding overloading biceps     12/26 Manual: Trigger point release to upper trap and anterior shoulder  Grade 1 and II inferior and posterior glides  Tennis ball release to hamstrings   There-ex: Wand press 2 lbs 2x10  Wand flexion wlbs  2x12  SL ER 2x15 1 lb  Supine ABC  2lbs  2x10 RPE of 5   Neuro-re-ed:  Row to neutral x10 red low RPE cream 2 x 10 RPE of 4 Triceps extension: Patient would like to start performing as part of his home exercise program.  Therapy educated him on the importance of not going past neutral.  Patient tolerated well.  3 x 10 with low RPE with green band.  Patient also advised he can do at the gym with cables just keep his weights low reps high Shoulder extension 3x10 red  kept at low RPE  Reviewed and updated HEP   08/27/24  PROM/stretching to L shoulder ROM updates Supine AAROM shoulder flexion 1# WaTE x12  Supine AAROM shoulder ABD 1# WaTe x12 Seated ER AAROM with 1# WaTe x12  SA punch 0# x12 SA Alphabet x2 0# Supine shoulder flexion AROM x10  0#  Sidelying shoulder ABD elbow bent 0# x10  Sidelying shoulder ER x12 0# L-stretch 5x5 seconds  Wall slides for flexion x12 Scap retractions red TB to neutral with elbow x12 Shoulder extensions red TB to neutral x12    08/22/2024  Manual: Trigger point release to upper trap and anterior shoulder  Grade 1 and II inferior and posterior glides  Tennis ball release to hamstrings   There-ex: Wand press 2 lbs 2x10  Wand flexion 3x10  SL ER 3x10  Supine ABC no weight 2x10   Neuro-re-ed:  Row to neutral 3x10 red  Shoulder extension 3x10   Reviewed and updated HEP    08/14/24  Eval, POC, HEP  All incisions C/D/I, well healed at this point with no signs of infection   ROM/stretching as appropriate, HEP review    PATIENT EDUCATION: Education details: exam findings, POC, HEP, biceps tenodesis precautions  Person educated: Patient Education method: Programmer, Multimedia, Demonstration, and Handouts Education comprehension: verbalized understanding, returned demonstration, and needs further education  HOME EXERCISE PROGRAM:  Oked him for supine aarom shoulder flexion, rows and shoulder extension to neutral with yellow TB plus the following  Access Code: [email or web address] URL: [email or web address] Date: 09/02/2024 Prepared by: Josette Rough  Exercises - Seated Shoulder External Rotation AAROM with Cane and Hand in Neutral  - 1 x daily - 7 x weekly - 2 sets - 10 reps - 2 seconds  hold - Seated Shoulder Abduction Towel Slide at Table Top  - 1 x daily - 7 x weekly - 2 sets - 10 reps - 2 seconds  hold - Supine Shoulder Abduction AROM  - 2-3 x daily - 7 x weekly - 1 sets - 10 reps - Supine  Shoulder Flexion to 90  - 2-3 x daily - 7 x weekly - 1 sets - 10 reps - Sleeper Stretch  - 2-3 x daily - 7 x weekly - 1 sets - 3 reps - 30 seconds  hold - Supine Chest Stretch with Elbows Bent  - 2-3 x daily - 7 x weekly - 1 sets - 3 reps - 30 seconds  hold   Hamstring HEP:  Access Code: [email or web address] URL: [email or web address] Date: 09/04/2024 Prepared by: Josette Rough     ASSESSMENT:  CLINICAL IMPRESSION:  Therapy performed trigger point dry needling to patient's upper trap today.  He had a great twitch response.  He had a palpable decrease in tissue tightness following.  He has had successful trial of trigger point dry needling before.  We also assessed the patient's hamstring for trigger point dry needling.  He did have any significant trigger points in his actual muscle belly of his hamstring.  He does have tenderness to palpation at the tendinous insertion but therapy is unable to needle up the high secondary to sciatic nerve.  Therapy did needle in the patient's gluteal.  He has significant spasm in his gluteal today.  He had a great twitch response.  Previous therapist had noted a leg length discrepancy.  Will put a 2 level heel lift in today to see if that helps.  He has a shorter limb on the left side which is where he is having his hamstring pain.  He is advised to continue with his exercises at home.  He is doing his exercises 6 7 times a week now.  He reports he was unable to do them last week and had better ability to sleep.  He was advised at this point if he is loading  his exercises properly he only needs to be doing them 3-4 times a week.  That may help with some of his nighttime inflammation.  Eval: Patient is a 69 y.o. M who was seen today for physical therapy evaluation and treatment for skilled PT care following L shoulder arthroscopy with rotator cuff repair, SAD, biceps tenodesis 07/30/24. Of note he did have an additional order placed for HS strain, we were  only able to address his post-op shoulder today however will plan on working on HS pain as time/reasonable efforts to recover from shoulder surgery allow.   OBJECTIVE IMPAIRMENTS: decreased ROM, decreased strength, increased fascial restrictions, impaired UE functional use, postural dysfunction, and pain.   ACTIVITY LIMITATIONS: carrying, lifting, sleeping, bathing, toileting, dressing, reach over head, and hygiene/grooming  PARTICIPATION LIMITATIONS: meal prep, cleaning, laundry, driving, shopping, community activity, and yard work  PERSONAL FACTORS: Past/current experiences and Time since onset of injury/illness/exacerbation are also affecting patient's functional outcome.   REHAB POTENTIAL: Good  CLINICAL DECISION MAKING: Stable/uncomplicated  EVALUATION COMPLEXITY: Low   GOALS: Goals reviewed with patient? No  SHORT TERM GOALS: Target date: 09/25/2024    Patient will be compliant with appropriate progressive HEP        GOAL STATUS: MET 08/27/24  2. L shoulder flexion and ABD A/PROM to be at least 150*         GOAL STATUS: ONGOING 08/27/24   3. L shoulder IR and IR A/PROM to be at least 60* at 90* ABD           GOAL STATUS: ONGOING 08/27/24   4. Will be more aware of posture with all functional tasks with use of ergonomic aides PRN/as desired      GOAL STATUS: ONGOING 08/27/24  5. Hamstring/LE pain PT evaluation to have been completed/appropriate goals written as time/shoulder surgery rehab allows GOAL STATUS: MET 09/04/24   LONG TERM GOALS: Target date: 11/06/2024   MMT to have improved by at least one grade in all weak groups       GOAL STATUS: Initial    2. AROM to have normalized and will be pain free all planes of motion      GOAL STATUS: Initial    3. Pain to be no more than 2/10 with all functional tasks L shoulder      GOAL STATUS: Initial   4. Will be able to perform all functional household and work based tasks without increase from resting pain  levels     GOAL STATUS: Initial   5. PSFS to have improved by at least 3 points to show improved QOL and subjective perception of condition      GOAL STATUS: Initial   6. Lower extremity LTGs to have been further established and addressed as time/rehab from shoulder surgery allows  GOAL STATUS: MET 09/04/24 as below  7. L LE MMT to be 4+/5 without pain, R hip extensor strength also to be 4+/5 GOAL STATUS: NEW   8. B HS and piriformis flexibility to be no more than 50% limited  GOAL STATUS: NEW  9. Will be able to perform all functional mobility and transfers at home and in the community with L HS pain no more than 2/10 GOAL STATUS: NEW  10. Will have been able to return to desired gym and exercise based activities with L HS pain no more than 2/10 GOAL STATUS: NEW     PLAN:  PT FREQUENCY: 2x/week  PT DURATION: 12 weeks  PLANNED INTERVENTIONS: 02835- PT  Re-evaluation, 97750- Physical Performance Testing, 97110-Therapeutic exercises, 97530- Therapeutic activity, [id number]- Neuromuscular re-education, 97535- Self Care, [id number]- Manual therapy, [id number]- Gait training, [phone]- Aquatic Therapy, [phone]- Vasopneumatic device, [id number]- Ultrasound, and 97033- Ionotophoresis 4mg /ml Dexamethasone   PLAN FOR NEXT SESSION: Shoulder: progress post-op shoulder rehab as appropriate per protocol (biceps tenodesis protocol per op notes), evaluate and set goals for hamstring/LE pain when able. Pt reports he was told 15# lifting restriction.   Hamstring: work on lumbar mobility and gross hip mobility/flexibility, L LE and posterior chain strengthening. Manual as desired.  Consider heel lift    Josette Rough, PT, DPT 09/09/2024 2:52 PM   "

## 2024-09-10 ENCOUNTER — Encounter (HOSPITAL_BASED_OUTPATIENT_CLINIC_OR_DEPARTMENT_OTHER): Payer: Self-pay | Admitting: Physical Therapy

## 2024-09-12 ENCOUNTER — Encounter (HOSPITAL_BASED_OUTPATIENT_CLINIC_OR_DEPARTMENT_OTHER): Payer: Self-pay

## 2024-09-12 ENCOUNTER — Ambulatory Visit (HOSPITAL_BASED_OUTPATIENT_CLINIC_OR_DEPARTMENT_OTHER)

## 2024-09-12 DIAGNOSIS — M79605 Pain in left leg: Secondary | ICD-10-CM

## 2024-09-12 DIAGNOSIS — M6281 Muscle weakness (generalized): Secondary | ICD-10-CM

## 2024-09-12 DIAGNOSIS — M25612 Stiffness of left shoulder, not elsewhere classified: Secondary | ICD-10-CM

## 2024-09-12 DIAGNOSIS — M25512 Pain in left shoulder: Secondary | ICD-10-CM

## 2024-09-12 NOTE — Therapy (Signed)
 " OUTPATIENT PHYSICAL THERAPY SHOULDER TREATMENT/HAMSTRING EVAL   Patient Name: Kyle Lawson MRN: [id number] DOB:1956-04-27, 69 y.o., male Today's Date: 09/12/2024  END OF SESSION:  PT End of Session - 09/12/24 1014     Visit Number 8    Number of Visits 17    Date for Recertification  11/06/24    Authorization Type MCR and BCBS    Authorization Time Period 08/14/24 to 11/07/23    Progress Note Due on Visit 10    PT Start Time 1014    PT Stop Time 1103    PT Time Calculation (min) 49 min    Activity Tolerance Patient tolerated treatment well    Behavior During Therapy [facility] for tasks assessed/performed              Past Medical History:  Diagnosis Date   Arthritis    Gout    History of kidney stones    Hyperlipidemia    Hypertension    Thoracic aortic aneurysm 2022   stable 4cm by CTA 12/2022   Past Surgical History:  Procedure Laterality Date   Anterior Cervical Decompression Fusion  2019   BICEPT TENODESIS Right 02/19/2024   Procedure: SUBPECTORAL BICEPS TENODESIS AND COLLAGEN PATCH AUGMENTATION;  Surgeon: Genelle Standing, MD;  Location: [facility];  Service: Orthopedics;  Laterality: Right;   DISTAL CLAVICLE EXCISION Right 2020   Right Shoulder Distal Clavicle Excision   JOINT REPLACEMENT  2008, 2013   Bilateral hip replacements   LAMINECTOMY  1984   L4-L5   LAMINECTOMY  1996   L3-L4   LUMBAR FUSION  01/11/2021   may 2022- L3-L5   LUMBAR FUSION  09/29/2021   L2-L3   SHOULDER ARTHROSCOPY WITH ROTATOR CUFF REPAIR Right 02/19/2024   Procedure: RIGHT SHOULDER ARTHROSCOPY;  Surgeon: Genelle Standing, MD;  Location: [facility];  Service: Orthopedics;  Laterality: Right;   SHOULDER ARTHROSCOPY WITH ROTATOR CUFF REPAIR Left 07/30/2024   Procedure: ARTHROSCOPY, SHOULDER, WITH ROTATOR CUFF REPAIR;  Surgeon: Genelle Standing, MD;  Location: [facility];  Service: Orthopedics;  Laterality: Left;  LEFT SHOULDER ARTHROSCOPY WITH  ACROMIOPLASTY, BICEPS TENODESIS AND COLLAGEN PATCH AUGMENTATION   SPINE SURGERY  2019, 2022, 2023   C4-C6 fusion, L2-L5 fusion   TOTAL HIP ARTHROPLASTY Left    left hip 09/2006   TOTAL HIP ARTHROPLASTY  09/14/2011   Right. Procedure: TOTAL HIP ARTHROPLASTY;  Surgeon: Toribio JULIANNA Chancy, MD;  Location[facility];  Service: Orthopedics;  Laterality: Right;   Patient Active Problem List   Diagnosis Date Noted   Nontraumatic incomplete tear of left rotator cuff 07/30/2024   Impingement syndrome of left shoulder 07/30/2024   Biceps tendinitis of left upper extremity 07/30/2024   Nontraumatic incomplete tear of right rotator cuff 02/19/2024   Fatty liver 05/20/2022   Essential hypertension 11/04/2021   Hyperlipidemia, unspecified 11/04/2021   Ascending aorta dilation 11/04/2021   Spinal stenosis of lumbar region 09/29/2021   HNP (herniated nucleus pulposus), lumbar 01/11/2021   Acromioclavicular joint pain 07/13/2019   Sternum pain 01/16/2019   Right anterior shoulder pain 11/08/2017   Neck pain 07/04/2017   Gout 02/05/2017   Lumbar spondylosis 01/19/2017   Acute bilateral low back pain with bilateral sciatica 01/19/2017    PCP: Katrinka Senior MD   REFERRING PROVIDER: Genelle Standing, MD for shoulder, [name], DO for HS   REFERRING DIAG: Diagnosis M75.42 (ICD-10-CM) - Impingement syndrome of left shoulder Diagnosis  M76.892 (ICD-10-CM) - Hamstring tendinitis of left  thigh  S76.312A (ICD-10-CM) - Strain of left hamstring muscle, initial encounter    THERAPY DIAG:  Stiffness of left shoulder, not elsewhere classified  Acute pain of left shoulder  Pain in left leg  Muscle weakness (generalized)  Rationale for Evaluation and Treatment: Rehabilitation  ONSET DATE: s/p L shoulder arthroscopy with rotator cuff repair, SAD, biceps tenodesis 07/30/24  SUBJECTIVE:                                                                                                                                                                                       SUBJECTIVE STATEMENT:  The patient reports the pain at night is improving. He had less when he didn't do his exercises. He was advised he can do his exercises 3-4 x a week as long as they are challenging.    Eval: Had this same surgery on the other side, saw [name] over at [facility] but schedules were busy and I hadn't been able get in to see her soon enough. First week after surgery was tough but its been feeling better, have been doing some basics. Surgeon took me out of the sling on Friday. Very active at baseline and looking forward to being able to get back into the gym. Doctor told me as 15# limit for lifting. Have been riding my bike a bit, saw Dr. Leonce and he diagnosed me with a badly sprained HS and gave me some exercises to try. HS has gotten better, took 3 weeks off from the bike and focused on walking instead of cycling/cut back on hiking.   Hand dominance: Right  PERTINENT HISTORY: See above   PAIN:  Are you having pain? L shoulder 1-2/10 more sore/achey than pain,  movement makes it better and nights make it worse  L HS 4/-5/10, sit bone and all distal attachment sites but nothing in the belly of the muscle itself; pain is aching more than sharp; exacerbated by stairs, getting out of chairs, overuse; feels better when exercise load is reduced, stretching can help for a short time   PRECAUTIONS: Shoulder  RED FLAGS: None   WEIGHT BEARING RESTRICTIONS: Yes NWB shoulder at eval   FALLS:  Has patient fallen in last 6 months? No  LIVING ENVIRONMENT: Lives with: lives with their family Lives in: House/apartment   OCCUPATION: Retired- [employer]   PLOF: Independent, Independent with basic ADLs, Independent with gait, and Independent with transfers  PATIENT GOALS: get use of arm back, be able to play golf in the spring, be able to exercise   NEXT MD VISIT:   OBJECTIVE:  Note: Objective measures  were completed at Evaluation unless otherwise noted.  PATIENT SURVEYS:  PSFS: THE PATIENT SPECIFIC FUNCTIONAL SCALE  Place score of 0-10 (0 = unable to perform activity and 10 = able to perform activity at the same level as before injury or problem)  Activity Date: 08/14/24    Sleeping/finding good sleeping position 3    2. Getting non button shirts on/off 2    3. Reaching to open car door  1    4. Gym (upper body) 0    Total Score 1.5      Total Score = Sum of activity scores/number of activities  Minimally Detectable Change: 3 points (for single activity); 2 points (for average score)  [facility] (nd). The Patient Specific Functional Scale . Retrieved from [email or web address]   09/04/24 LEFS- 47/80    COGNITION: Overall cognitive status: Within functional limits for tasks assessed     SENSATION: Not tested  POSTURE: Rounded shoulders, forward head   UPPER EXTREMITY ROM:   ROM  Right eval Left Eval supine  Left 08/27/24 Left 09/02/24  Shoulder flexion  PROM 110*, AAROM 110* PROM approximately 130*, AAROM 132* AROM 140* supine   Shoulder extension      Shoulder abduction  PROM 90* PROM 140*, AAROM 136* AROM supine 141*  Shoulder adduction      Shoulder internal rotation      Shoulder external rotation  PROM 30* by his side  PROM 30* by his side, AAROM approximately 40* seated  At 70* ABD, 35* ER   Elbow flexion      Elbow extension      Wrist flexion      Wrist extension      Wrist ulnar deviation      Wrist radial deviation      Wrist pronation      Wrist supination      (Blank rows = not tested)  UPPER EXTREMITY MMT:  MMT Right eval Left eval  Shoulder flexion    Shoulder extension    Shoulder abduction    Shoulder adduction    Shoulder internal rotation    Shoulder external rotation    Middle trapezius    Lower trapezius    Elbow flexion    Elbow extension    Wrist flexion     Wrist extension    Wrist ulnar deviation    Wrist radial deviation    Wrist pronation    Wrist supination    Grip strength (lbs)    (Blank rows = not tested)  MMT not tested at eval   09/04/24  Quads: L 4+/5, R 5/5 Hams: L 3-/5, R 4+/5 Hip flexors: L 4+/5, R 5/5 Hip ABD:4+/5 B Hip extensors: L 3/5, R 3+/5  Muscle flexibility-  quads 50% limited B, HS 80% limited B in 90/90 test, L piriformis >90% limited and R about 75% limited  Noted leg length difference with L LE about 1/2 inch longer   Palpation- tender at ischial tuberosity, some tenderness laterally/proximally, no trigger points noted muscle belly  TREATMENT DATE:   09/12/24 PROM L shoulder Supine wand press 3# Wate bar 2x10 Supine press L only with 2# x10 Sidelying ER 1# 2x10 Finger ladder x5 with 10 sec hold at top  Active HS stretch (review for HEP) Cook bridge 5 x10 S/l hip abduction 2x15L Supine Slr 2x10 HS isometric 5 x10  HEP update/review    1/5 Trigger Point Dry Needling  Initial Treatment: Pt instructed on Dry Needling rational, procedures, and possible side effects. Pt instructed to expect mild to moderate muscle soreness later in the day and/or into the next day.  Pt instructed in methods to reduce muscle soreness. Pt instructed to continue prescribed HEP. Because Dry Needling was performed over or adjacent to a lung field, pt was educated on S/S of pneumothorax and to seek immediate medical attention should they occur.  Patient was educated on signs and symptoms of infection and other risk factors and advised to seek medical attention should they occur.  Patient verbalized understanding of these instructions and education.   Patient Verbal Consent Given: Yes Education Handout Provided: Yes Muscles Treated: Needle left upper trap X3 using 0.30 X50 needle.  Needle to  left glute max using a 0.30 X75 needle Electrical Stimulation Performed: No Treatment Response/Outcome: Great twitch response noted with all needles   Gait training: Assessed patient's leg length discrepancy.  Put 2 level heel shift in patient's shoe.  Reviewed patient's gait with his 2 level heel lift.  No significant antalgic gait noted.  Reviewed wearing schedule for heel lift.  Patient advised if he gets sore or painful in his hamstrings or hip to stop using heel lift to see if there is a  correlation.  Manual:  Skilled palpation of trigger points  Soft tissue mobilization to upper trap/gluteal/ and proximal hamstring insertion      09/04/24  Subjective, LEFS, MMT, flexibility, skilled palpation of HS/hip region  Bridges x10 Lumbar rotation stretch 10x3 seconds alternating Prone HS curls red TB x8 keeping pain 4/10 or less   Education on findings, POC for his HS and new HEP     PATIENT EDUCATION: Education details: exam findings, POC, HEP, biceps tenodesis precautions  Person educated: Patient Education method: Programmer, Multimedia, Demonstration, and Handouts Education comprehension: verbalized understanding, returned demonstration, and needs further education  HOME EXERCISE PROGRAM:  Oked him for supine aarom shoulder flexion, rows and shoulder extension to neutral with yellow TB plus the following  Access Code: [email or web address] URL: [email or web address] Date: 09/02/2024 Prepared by: Josette Rough  Exercises - Seated Shoulder External Rotation AAROM with Cane and Hand in Neutral  - 1 x daily - 7 x weekly - 2 sets - 10 reps - 2 seconds  hold - Seated Shoulder Abduction Towel Slide at Table Top  - 1 x daily - 7 x weekly - 2 sets - 10 reps - 2 seconds  hold - Supine Shoulder Abduction AROM  - 2-3 x daily - 7 x weekly - 1 sets - 10 reps - Supine Shoulder Flexion to 90  - 2-3 x daily - 7 x weekly - 1 sets - 10 reps - Sleeper Stretch  - 2-3 x daily - 7 x weekly - 1 sets -  3 reps - 30 seconds  hold - Supine Chest Stretch with Elbows Bent  - 2-3 x daily - 7 x weekly - 1 sets - 3 reps - 30 seconds  hold   Hamstring HEP:  Access Code: [email or web address] URL: [email or web address] Date: 09/04/2024 Prepared by: Josette Rough  ASSESSMENT:  CLINICAL IMPRESSION:  Trialed cook bridge today to improve NMC of glute max without activation lumbar extensors. He tolerated this well, though did note good fatigue in glute. Pt also fatigues quickly with glute medius strengthening. With L shoulder, he demonstrates tightness at end range each plane, but still within expectations. Tolerates light resistance well and did not push this today. Provided updates to HEP with instructions to not push past fatigue/pain limits. Reviewed healing timeline and precautions.    Eval: Patient is a 69 y.o. M who was seen today for physical therapy evaluation and treatment for skilled PT care following L shoulder arthroscopy with rotator cuff repair, SAD, biceps tenodesis 07/30/24. Of note he did have an additional order placed for HS strain, we were only able to address his post-op shoulder today however will plan on working on HS pain as time/reasonable efforts to recover from shoulder surgery allow.   OBJECTIVE IMPAIRMENTS: decreased ROM, decreased strength, increased fascial restrictions, impaired UE functional use, postural dysfunction, and pain.   ACTIVITY LIMITATIONS: carrying, lifting, sleeping, bathing, toileting, dressing, reach over head, and hygiene/grooming  PARTICIPATION LIMITATIONS: meal prep, cleaning, laundry, driving, shopping, community activity, and yard work  PERSONAL FACTORS: Past/current experiences and Time since onset of injury/illness/exacerbation are also affecting patient's functional outcome.   REHAB POTENTIAL: Good  CLINICAL DECISION MAKING: Stable/uncomplicated  EVALUATION COMPLEXITY: Low   GOALS: Goals reviewed with patient? No  SHORT TERM  GOALS: Target date: 09/25/2024    Patient will be compliant with appropriate progressive HEP        GOAL STATUS: MET 08/27/24  2. L shoulder flexion and ABD A/PROM to be at least 150*         GOAL STATUS: ONGOING 08/27/24   3. L shoulder IR and IR A/PROM to be at least 60* at 90* ABD           GOAL STATUS: ONGOING 08/27/24   4. Will be more aware of posture with all functional tasks with use of ergonomic aides PRN/as desired      GOAL STATUS: ONGOING 08/27/24  5. Hamstring/LE pain PT evaluation to have been completed/appropriate goals written as time/shoulder surgery rehab allows GOAL STATUS: MET 09/04/24   LONG TERM GOALS: Target date: 11/06/2024   MMT to have improved by at least one grade in all weak groups       GOAL STATUS: Initial    2. AROM to have normalized and will be pain free all planes of motion      GOAL STATUS: Initial    3. Pain to be no more than 2/10 with all functional tasks L shoulder      GOAL STATUS: Initial   4. Will be able to perform all functional household and work based tasks without increase from resting pain levels     GOAL STATUS: Initial   5. PSFS to have improved by at least 3 points to show improved QOL and subjective perception of condition      GOAL STATUS: Initial   6. Lower extremity LTGs to have been further established and addressed as time/rehab from shoulder surgery allows  GOAL STATUS: MET 09/04/24 as below  7. L LE MMT to be 4+/5 without pain, R hip extensor strength also to be 4+/5 GOAL STATUS: NEW   8. B HS and piriformis flexibility to be no more than 50% limited  GOAL STATUS: NEW  9. Will be able to perform all functional mobility and transfers at home and in the  community with L HS pain no more than 2/10 GOAL STATUS: NEW  10. Will have been able to return to desired gym and exercise based activities with L HS pain no more than 2/10 GOAL STATUS: NEW     PLAN:  PT FREQUENCY: 2x/week  PT DURATION: 12  weeks  PLANNED INTERVENTIONS: 97164- PT Re-evaluation, 97750- Physical Performance Testing, 97110-Therapeutic exercises, 97530- Therapeutic activity, [id number]- Neuromuscular re-education, 97535- Self Care, [id number]- Manual therapy, [id number]- Gait training, [id number]- Aquatic Therapy, [id number]- Vasopneumatic device, [id number]- Ultrasound, and [id number]- Ionotophoresis 4mg /ml Dexamethasone   PLAN FOR NEXT SESSION: Shoulder: progress post-op shoulder rehab as appropriate per protocol (biceps tenodesis protocol per op notes), evaluate and set goals for hamstring/LE pain when able. Pt reports he was told 15# lifting restriction.   Hamstring: work on lumbar mobility and gross hip mobility/flexibility, L LE and posterior chain strengthening. Manual as desired.  Consider heel lift     Asberry BRAVO Anneke Cundy, PTA 09/12/2024, 11:26 AM    "

## 2024-09-20 ENCOUNTER — Encounter (HOSPITAL_BASED_OUTPATIENT_CLINIC_OR_DEPARTMENT_OTHER): Payer: Self-pay

## 2024-09-20 ENCOUNTER — Ambulatory Visit (HOSPITAL_BASED_OUTPATIENT_CLINIC_OR_DEPARTMENT_OTHER): Admitting: Orthopaedic Surgery

## 2024-09-20 ENCOUNTER — Ambulatory Visit (HOSPITAL_BASED_OUTPATIENT_CLINIC_OR_DEPARTMENT_OTHER)

## 2024-09-20 ENCOUNTER — Encounter (HOSPITAL_BASED_OUTPATIENT_CLINIC_OR_DEPARTMENT_OTHER): Admitting: Orthopaedic Surgery

## 2024-09-20 DIAGNOSIS — M79605 Pain in left leg: Secondary | ICD-10-CM

## 2024-09-20 DIAGNOSIS — M25612 Stiffness of left shoulder, not elsewhere classified: Secondary | ICD-10-CM

## 2024-09-20 DIAGNOSIS — M25512 Pain in left shoulder: Secondary | ICD-10-CM

## 2024-09-20 DIAGNOSIS — M7542 Impingement syndrome of left shoulder: Secondary | ICD-10-CM

## 2024-09-20 DIAGNOSIS — M6281 Muscle weakness (generalized): Secondary | ICD-10-CM

## 2024-09-20 DIAGNOSIS — M25511 Pain in right shoulder: Secondary | ICD-10-CM

## 2024-09-20 NOTE — Progress Notes (Signed)
 "                                Post Operative Evaluation    Procedure/Date of Surgery: Left shoulder arthroscopy with biceps tenodesis and collagen patch augmentation 11/25  Interval History:    Presents 6 weeks status post above procedure.  At this point he is doing much better.  Overhead range of motion is coming along nicely.  There is still occasional soreness  PMH/PSH/Family History/Social History/Meds/Allergies:    Past Medical History:  Diagnosis Date   Arthritis    Gout    History of kidney stones    Hyperlipidemia    Hypertension    Thoracic aortic aneurysm 2022   stable 4cm by CTA 12/2022   Past Surgical History:  Procedure Laterality Date   Anterior Cervical Decompression Fusion  2019   BICEPT TENODESIS Right 02/19/2024   Procedure: SUBPECTORAL BICEPS TENODESIS AND COLLAGEN PATCH AUGMENTATION;  Surgeon: Genelle Standing, MD;  Location: [facility];  Service: Orthopedics;  Laterality: Right;   DISTAL CLAVICLE EXCISION Right 2020   Right Shoulder Distal Clavicle Excision   JOINT REPLACEMENT  2008, 2013   Bilateral hip replacements   LAMINECTOMY  1984   L4-L5   LAMINECTOMY  1996   L3-L4   LUMBAR FUSION  01/11/2021   may 2022- L3-L5   LUMBAR FUSION  09/29/2021   L2-L3   SHOULDER ARTHROSCOPY WITH ROTATOR CUFF REPAIR Right 02/19/2024   Procedure: RIGHT SHOULDER ARTHROSCOPY;  Surgeon: Genelle Standing, MD;  Location: [facility];  Service: Orthopedics;  Laterality: Right;   SHOULDER ARTHROSCOPY WITH ROTATOR CUFF REPAIR Left 07/30/2024   Procedure: ARTHROSCOPY, SHOULDER, WITH ROTATOR CUFF REPAIR;  Surgeon: Genelle Standing, MD;  Location:  SURGERY CENTER;  Service: Orthopedics;  Laterality: Left;  LEFT SHOULDER ARTHROSCOPY WITH ACROMIOPLASTY, BICEPS TENODESIS AND COLLAGEN PATCH AUGMENTATION   SPINE SURGERY  2019, 2022, 2023   C4-C6 fusion, L2-L5 fusion   TOTAL HIP ARTHROPLASTY Left    left hip 09/2006   TOTAL HIP ARTHROPLASTY   09/14/2011   Right. Procedure: TOTAL HIP ARTHROPLASTY;  Surgeon: Toribio JULIANNA Chancy, MD;  [facility] OR;  Service: Orthopedics;  Laterality: Right;   Social History   Socioeconomic History   Marital status: Married    Spouse name: Not on file   Number of children: 2   Years of education: Not on file   Highest education level: Not on file  Occupational History   Not on file  Tobacco Use   Smoking status: Never   Smokeless tobacco: Never  Vaping Use   Vaping status: Never Used  Substance and Sexual Activity   Alcohol use: Yes    Alcohol/week: 4.0 - 5.0 standard drinks of alcohol    Types: 4 - 5 Cans of beer per week    Comment: social   Drug use: Yes    Types: Marijuana    Comment: for pain- very occasionally   Sexual activity: Yes    Partners: Female  Other Topics Concern   Not on file  Social History Narrative   Married 28 years in 2023. Daughter 23, son 97 in 2023.    From [place]       Retired from [employer]- enjoying retirement      Hobbies: traveling, hiking, walks daily, live music   Social Drivers of Health   Tobacco Use: Low Risk (09/20/2024)   Patient History  Smoking Tobacco Use: Never    Smokeless Tobacco Use: Never    Passive Exposure: Not on file  Financial Resource Strain: Low Risk (07/26/2023)   Overall Financial Resource Strain (CARDIA)    Difficulty of Paying Living Expenses: Not hard at all  Food Insecurity: No Food Insecurity (08/20/2024)   Epic    Worried About Programme Researcher, Broadcasting/film/video in the Last Year: Never true    Ran Out of Food in the Last Year: Never true  Transportation Needs: No Transportation Needs (08/20/2024)   Epic    Lack of Transportation (Medical): No    Lack of Transportation (Non-Medical): No  Physical Activity: Sufficiently Active (08/20/2024)   Exercise Vital Sign    Days of Exercise per Week: 5 days    Minutes of Exercise per Session: 90 min  Stress: No Stress Concern Present (08/20/2024)   [employer] of  Occupational Health - Occupational Stress Questionnaire    Feeling of Stress: Only a little  Social Connections: Moderately Integrated (08/20/2024)   Social Connection and Isolation Panel    Frequency of Communication with Friends and Family: More than three times a week    Frequency of Social Gatherings with Friends and Family: More than three times a week    Attends Religious Services: 1 to 4 times per year    Active Member of Clubs or Organizations: No    Attends Banker Meetings: Never    Marital Status: Married  Depression (PHQ2-9): Low Risk (08/20/2024)   Depression (PHQ2-9)    PHQ-2 Score: 0  Alcohol Screen: Not on file  Housing: Unknown (08/20/2024)   Epic    Unable to Pay for Housing in the Last Year: No    Number of Times Moved in the Last Year: Not on file    Homeless in the Last Year: No  Utilities: Not At Risk (08/20/2024)   Epic    Threatened with loss of utilities: No  Health Literacy: Adequate Health Literacy (08/20/2024)   B1300 Health Literacy    Frequency of need for help with medical instructions: Never   Family History  Problem Relation Age of Onset   Diabetes Mother        passed at 28   Skin cancer Father    Gout Father    Hypertension Father        passed at 69   Hyperlipidemia Father    CAD Brother        CABG 78   Cancer Brother    Diabetes Brother    Healthy Brother    Healthy Brother    Alcoholism Brother    Depression Brother        suicide age 70   Healthy Maternal Grandmother        lived to 84   Brain cancer Maternal Grandfather        age 36- was an MD   CAD Paternal Grandmother        died in 78s   CAD Paternal Grandfather        died in 57s   Obesity Sister    Arthritis Sister    Healthy Sister    No Known Allergies Current Outpatient Medications  Medication Sig Dispense Refill   acetaminophen  (TYLENOL ) 500 MG tablet Take 500 mg by mouth every 6 (six) hours as needed (pain.).     allopurinol  (ZYLOPRIM ) 300 MG  tablet TAKE 1 TABLET EVERY MORNING 90 tablet 3   aspirin  EC 325 MG tablet Take  1 tablet (325 mg total) by mouth daily. (Patient not taking: Reported on 08/20/2024) 14 tablet 0   celecoxib  (CELEBREX ) 200 MG capsule Take 1 capsule (200 mg total) by mouth 2 (two) times daily. 60 capsule 3   Cholecalciferol (VITAMIN D -3 PO) Take 2,000 Units by mouth in the morning.     colchicine  0.6 MG tablet Take 1 tablet (0.6 mg total) by mouth daily. At start of flare- take 2 and then an hour later (max 3 day one) if not improved can take 1 more, then daily until gout flare resolves 30 tablet 3   cyclobenzaprine  (FLEXERIL ) 10 MG tablet Take 1 tablet (10 mg total) by mouth 3 (three) times daily as needed for muscle spasms. 30 tablet 0   diclofenac  Sodium (PENNSAID ) 2 % SOLN Apply 1-2 Pump topically 2 (two) times daily as needed (pain.).     losartan  (COZAAR ) 100 MG tablet Take 1 tablet (100 mg total) by mouth daily. 90 tablet 3   Multiple Vitamin (MULTIVITAMIN WITH MINERALS) TABS tablet Take 1 tablet by mouth in the morning.     oxyCODONE  (ROXICODONE ) 5 MG immediate release tablet Take 1 tablet (5 mg total) by mouth every 4 (four) hours as needed for severe pain (pain score 7-10) or breakthrough pain. 25 tablet 0   simvastatin  (ZOCOR ) 40 MG tablet TAKE 1 TABLET DAILY 90 tablet 3   No current facility-administered medications for this visit.   No results found.  Review of Systems:   A ROS was performed including pertinent positives and negatives as documented in the HPI.   Musculoskeletal Exam:    There were no vitals taken for this visit.  Left shoulder incisions are well-appearing without erythema or drainage.  Active forward elevation is to 90 degrees external Tatian at side is to 20 degrees internal rotation deferred today  Imaging:      I personally reviewed and interpreted the radiographs.   Assessment:   6 weeks status post left shoulder arthroscopy with rotator cuff collagen patch augmentation  and biceps.  Overall he is continuing to improve nicely.  At this time we will continue to work through physical therapy I will plan to see him back in 6 weeks for reassessment Plan :    - Return to clinic 6 weeks for reassessment      I personally saw and evaluated the patient, and participated in the management and treatment plan.  Elspeth Parker, MD Attending Physician, Orthopedic Surgery  This document was dictated using Dragon voice recognition software. A reasonable attempt at proof reading has been made to minimize errors. "

## 2024-09-20 NOTE — Therapy (Signed)
 " OUTPATIENT PHYSICAL THERAPY SHOULDER TREATMENT/HAMSTRING TREATMENT   Patient Name: Kyle Lawson MRN: [id number] DOB:07-Jun-1956, 69 y.o., male Today's Date: 09/20/2024  END OF SESSION:  PT End of Session - 09/20/24 0927     Visit Number 9    Number of Visits 17    Date for Recertification  11/06/24    Authorization Type MCR and BCBS    Authorization Time Period 08/14/24 to 11/07/23    Progress Note Due on Visit 10    PT Start Time 0933    PT Stop Time 1018    PT Time Calculation (min) 45 min    Activity Tolerance Patient tolerated treatment well    Behavior During Therapy [facility] for tasks assessed/performed               Past Medical History:  Diagnosis Date   Arthritis    Gout    History of kidney stones    Hyperlipidemia    Hypertension    Thoracic aortic aneurysm 2022   stable 4cm by CTA 12/2022   Past Surgical History:  Procedure Laterality Date   Anterior Cervical Decompression Fusion  2019   BICEPT TENODESIS Right 02/19/2024   Procedure: SUBPECTORAL BICEPS TENODESIS AND COLLAGEN PATCH AUGMENTATION;  Surgeon: Genelle Standing, MD;  Location: [facility];  Service: Orthopedics;  Laterality: Right;   DISTAL CLAVICLE EXCISION Right 2020   Right Shoulder Distal Clavicle Excision   JOINT REPLACEMENT  2008, 2013   Bilateral hip replacements   LAMINECTOMY  1984   L4-L5   LAMINECTOMY  1996   L3-L4   LUMBAR FUSION  01/11/2021   may 2022- L3-L5   LUMBAR FUSION  09/29/2021   L2-L3   SHOULDER ARTHROSCOPY WITH ROTATOR CUFF REPAIR Right 02/19/2024   Procedure: RIGHT SHOULDER ARTHROSCOPY;  Surgeon: Genelle Standing, MD;  Location: [facility];  Service: Orthopedics;  Laterality: Right;   SHOULDER ARTHROSCOPY WITH ROTATOR CUFF REPAIR Left 07/30/2024   Procedure: ARTHROSCOPY, SHOULDER, WITH ROTATOR CUFF REPAIR;  Surgeon: Genelle Standing, MD;  Location: [facility];  Service: Orthopedics;  Laterality: Left;  LEFT SHOULDER ARTHROSCOPY  WITH ACROMIOPLASTY, BICEPS TENODESIS AND COLLAGEN PATCH AUGMENTATION   SPINE SURGERY  2019, 2022, 2023   C4-C6 fusion, L2-L5 fusion   TOTAL HIP ARTHROPLASTY Left    left hip 09/2006   TOTAL HIP ARTHROPLASTY  09/14/2011   Right. Procedure: TOTAL HIP ARTHROPLASTY;  Surgeon: Toribio JULIANNA Chancy, MD;  Location: [facility];  Service: Orthopedics;  Laterality: Right;   Patient Active Problem List   Diagnosis Date Noted   Nontraumatic incomplete tear of left rotator cuff 07/30/2024   Impingement syndrome of left shoulder 07/30/2024   Biceps tendinitis of left upper extremity 07/30/2024   Nontraumatic incomplete tear of right rotator cuff 02/19/2024   Fatty liver 05/20/2022   Essential hypertension 11/04/2021   Hyperlipidemia, unspecified 11/04/2021   Ascending aorta dilation 11/04/2021   Spinal stenosis of lumbar region 09/29/2021   HNP (herniated nucleus pulposus), lumbar 01/11/2021   Acromioclavicular joint pain 07/13/2019   Sternum pain 01/16/2019   Right anterior shoulder pain 11/08/2017   Neck pain 07/04/2017   Gout 02/05/2017   Lumbar spondylosis 01/19/2017   Acute bilateral low back pain with bilateral sciatica 01/19/2017    PCP: Katrinka Senior MD   REFERRING PROVIDER: Genelle Standing, MD for shoulder, [name], DO for HS   REFERRING DIAG: Diagnosis M75.42 (ICD-10-CM) - Impingement syndrome of left shoulder Diagnosis  M76.892 (ICD-10-CM) - Hamstring tendinitis of  left thigh  S76.312A (ICD-10-CM) - Strain of left hamstring muscle, initial encounter    THERAPY DIAG:  Stiffness of left shoulder, not elsewhere classified  Acute pain of left shoulder  Pain in left leg  Muscle weakness (generalized)  Acute pain of right shoulder  Rationale for Evaluation and Treatment: Rehabilitation  ONSET DATE: s/p L shoulder arthroscopy with rotator cuff repair, SAD, biceps tenodesis 07/30/24  SUBJECTIVE:                                                                                                                                                                                       SUBJECTIVE STATEMENT: Pt saw Bokshan for f/u this morning which went well. He said I'm where I should be at this point. Pt reports he is feeling no improvement in L HS.     Eval: Had this same surgery on the other side, saw [name] over at [facility] but schedules were busy and I hadn't been able get in to see her soon enough. First week after surgery was tough but its been feeling better, have been doing some basics. Surgeon took me out of the sling on Friday. Very active at baseline and looking forward to being able to get back into the gym. Doctor told me as 15# limit for lifting. Have been riding my bike a bit, saw Dr. Leonce and he diagnosed me with a badly sprained HS and gave me some exercises to try. HS has gotten better, took 3 weeks off from the bike and focused on walking instead of cycling/cut back on hiking.   Hand dominance: Right  PERTINENT HISTORY: See above   PAIN:  Are you having pain? L shoulder 1-2/10 more sore/achey than pain,  movement makes it better and nights make it worse  L HS 4/-5/10, sit bone and all distal attachment sites but nothing in the belly of the muscle itself; pain is aching more than sharp; exacerbated by stairs, getting out of chairs, overuse; feels better when exercise load is reduced, stretching can help for a short time   PRECAUTIONS: Shoulder  RED FLAGS: None   WEIGHT BEARING RESTRICTIONS: Yes NWB shoulder at eval   FALLS:  Has patient fallen in last 6 months? No  LIVING ENVIRONMENT: Lives with: lives with their family Lives in: House/apartment   OCCUPATION: Retired- [employer]   PLOF: Independent, Independent with basic ADLs, Independent with gait, and Independent with transfers  PATIENT GOALS: get use of arm back, be able to play golf in the spring, be able to exercise   NEXT MD VISIT:   OBJECTIVE:  Note: Objective  measures were completed at Evaluation unless otherwise  noted.    PATIENT SURVEYS:  PSFS: THE PATIENT SPECIFIC FUNCTIONAL SCALE  Place score of 0-10 (0 = unable to perform activity and 10 = able to perform activity at the same level as before injury or problem)  Activity Date: 08/14/24    Sleeping/finding good sleeping position 3    2. Getting non button shirts on/off 2    3. Reaching to open car door  1    4. Gym (upper body) 0    Total Score 1.5      Total Score = Sum of activity scores/number of activities  Minimally Detectable Change: 3 points (for single activity); 2 points (for average score)  [employer] [facility] (nd). The Patient Specific Functional Scale . Retrieved from [email or web address]   09/04/24 LEFS- 47/80    COGNITION: Overall cognitive status: Within functional limits for tasks assessed     SENSATION: Not tested  POSTURE: Rounded shoulders, forward head   UPPER EXTREMITY ROM:   ROM  Right eval Left Eval supine  Left 08/27/24 Left 09/02/24  Shoulder flexion  PROM 110*, AAROM 110* PROM approximately 130*, AAROM 132* AROM 140* supine   Shoulder extension      Shoulder abduction  PROM 90* PROM 140*, AAROM 136* AROM supine 141*  Shoulder adduction      Shoulder internal rotation      Shoulder external rotation  PROM 30* by his side  PROM 30* by his side, AAROM approximately 40* seated  At 70* ABD, 35* ER   Elbow flexion      Elbow extension      Wrist flexion      Wrist extension      Wrist ulnar deviation      Wrist radial deviation      Wrist pronation      Wrist supination      (Blank rows = not tested)  UPPER EXTREMITY MMT:  MMT Right eval Left eval  Shoulder flexion    Shoulder extension    Shoulder abduction    Shoulder adduction    Shoulder internal rotation    Shoulder external rotation    Middle trapezius    Lower trapezius    Elbow flexion    Elbow extension    Wrist  flexion    Wrist extension    Wrist ulnar deviation    Wrist radial deviation    Wrist pronation    Wrist supination    Grip strength (lbs)    (Blank rows = not tested)  MMT not tested at eval   09/04/24  Quads: L 4+/5, R 5/5 Hams: L 3-/5, R 4+/5 Hip flexors: L 4+/5, R 5/5 Hip ABD:4+/5 B Hip extensors: L 3/5, R 3+/5  Muscle flexibility-  quads 50% limited B, HS 80% limited B in 90/90 test, L piriformis >90% limited and R about 75% limited  Noted leg length difference with L LE about 1/2 inch longer   Palpation- tender at ischial tuberosity, some tenderness laterally/proximally, no trigger points noted muscle belly  TREATMENT DATE:    09/20/24 PROM L shoulder Gentle GHJ mobilizations for flexion and abduction Supine flexion with 2# wate bar 2x10 Sidelying abduction x10 (challenging) Standing shoulder flexion x15  Active HS stretch 15 x6 Cook bridge 5 2x10bil Staggered STS 2x10ea   09/12/24 PROM L shoulder Supine wand press 3# Wate bar 2x10 Supine press L only with 2# x10 Sidelying ER 1# 2x10 Finger ladder x5 with 10 sec hold at top  Active HS stretch (review for HEP) Cook bridge 5 x10 S/l hip abduction 2x15L Supine Slr 2x10 HS isometric 5 x10  HEP update/review    1/5 Trigger Point Dry Needling  Initial Treatment: Pt instructed on Dry Needling rational, procedures, and possible side effects. Pt instructed to expect mild to moderate muscle soreness later in the day and/or into the next day.  Pt instructed in methods to reduce muscle soreness. Pt instructed to continue prescribed HEP. Because Dry Needling was performed over or adjacent to a lung field, pt was educated on S/S of pneumothorax and to seek immediate medical attention should they occur.  Patient was educated on signs and symptoms of infection and other risk factors and  advised to seek medical attention should they occur.  Patient verbalized understanding of these instructions and education.   Patient Verbal Consent Given: Yes Education Handout Provided: Yes Muscles Treated: Needle left upper trap X3 using 0.30 X50 needle.  Needle to left glute max using a 0.30 X75 needle Electrical Stimulation Performed: No Treatment Response/Outcome: Great twitch response noted with all needles   Gait training: Assessed patient's leg length discrepancy.  Put 2 level heel shift in patient's shoe.  Reviewed patient's gait with his 2 level heel lift.  No significant antalgic gait noted.  Reviewed wearing schedule for heel lift.  Patient advised if he gets sore or painful in his hamstrings or hip to stop using heel lift to see if there is a  correlation.  Manual:  Skilled palpation of trigger points  Soft tissue mobilization to upper trap/gluteal/ and proximal hamstring insertion       PATIENT EDUCATION: Education details: exam findings, POC, HEP, biceps tenodesis precautions  Person educated: Patient Education method: Programmer, Multimedia, Demonstration, and Handouts Education comprehension: verbalized understanding, returned demonstration, and needs further education  HOME EXERCISE PROGRAM:  Oked him for supine aarom shoulder flexion, rows and shoulder extension to neutral with yellow TB plus the following  Access Code: [email or web address] URL: [email or web address] Date: 09/02/2024 Prepared by: Josette Rough    Hamstring HEP:  Access Code: [email or web address] URL: [email or web address] Date: 09/04/2024 Prepared by: Josette Rough     ASSESSMENT:  CLINICAL IMPRESSION:   Pt 7 weeks s/p shoulder surgery. Has f/u for HS with Dr. Leonce on 2/4. Discussed current healing phase and progressions/restrictions. With PROM, pt limited into end ranges, but progressing as expected for healing phase. Observed strength deficits in bil gluteal mm with exercises.  Advised in bilateral performance of HEP. Fatigue, NMC deficit with s/l shoulder abduction and able to tolerate in modified range. Able to complete staggered STS without increase in HS discomfort today. Pt having massage later today. Advised in HS stretching following this. Will continue to progress per protocol as tolerated.     Trialed cook bridge today to improve NMC of glute max without activation lumbar extensors. He tolerated this well, though did note good fatigue in glute. Pt also fatigues quickly with glute medius strengthening. With L shoulder, he demonstrates tightness at end range each plane, but still within expectations. Tolerates light  resistance well and did not push this today. Provided updates to HEP with instructions to not push past fatigue/pain limits. Reviewed healing timeline and precautions.    Eval: Patient is a 69 y.o. M who was seen today for physical therapy evaluation and treatment for skilled PT care following L shoulder arthroscopy with rotator cuff repair, SAD, biceps tenodesis 07/30/24. Of note he did have an additional order placed for HS strain, we were only able to address his post-op shoulder today however will plan on working on HS pain as time/reasonable efforts to recover from shoulder surgery allow.   OBJECTIVE IMPAIRMENTS: decreased ROM, decreased strength, increased fascial restrictions, impaired UE functional use, postural dysfunction, and pain.   ACTIVITY LIMITATIONS: carrying, lifting, sleeping, bathing, toileting, dressing, reach over head, and hygiene/grooming  PARTICIPATION LIMITATIONS: meal prep, cleaning, laundry, driving, shopping, community activity, and yard work  PERSONAL FACTORS: Past/current experiences and Time since onset of injury/illness/exacerbation are also affecting patient's functional outcome.   REHAB POTENTIAL: Good  CLINICAL DECISION MAKING: Stable/uncomplicated  EVALUATION COMPLEXITY: Low   GOALS: Goals reviewed with  patient? No  SHORT TERM GOALS: Target date: 09/25/2024    Patient will be compliant with appropriate progressive HEP        GOAL STATUS: MET 08/27/24  2. L shoulder flexion and ABD A/PROM to be at least 150*         GOAL STATUS: ONGOING 08/27/24   3. L shoulder IR and IR A/PROM to be at least 60* at 90* ABD           GOAL STATUS: ONGOING 08/27/24   4. Will be more aware of posture with all functional tasks with use of ergonomic aides PRN/as desired      GOAL STATUS: ONGOING 08/27/24  5. Hamstring/LE pain PT evaluation to have been completed/appropriate goals written as time/shoulder surgery rehab allows GOAL STATUS: MET 09/04/24   LONG TERM GOALS: Target date: 11/06/2024   MMT to have improved by at least one grade in all weak groups       GOAL STATUS: Initial    2. AROM to have normalized and will be pain free all planes of motion      GOAL STATUS: Initial    3. Pain to be no more than 2/10 with all functional tasks L shoulder      GOAL STATUS: Initial   4. Will be able to perform all functional household and work based tasks without increase from resting pain levels     GOAL STATUS: Initial   5. PSFS to have improved by at least 3 points to show improved QOL and subjective perception of condition      GOAL STATUS: Initial   6. Lower extremity LTGs to have been further established and addressed as time/rehab from shoulder surgery allows  GOAL STATUS: MET 09/04/24 as below  7. L LE MMT to be 4+/5 without pain, R hip extensor strength also to be 4+/5 GOAL STATUS: NEW   8. B HS and piriformis flexibility to be no more than 50% limited  GOAL STATUS: NEW  9. Will be able to perform all functional mobility and transfers at home and in the community with L HS pain no more than 2/10 GOAL STATUS: NEW  10. Will have been able to return to desired gym and exercise based activities with L HS pain no more than 2/10 GOAL STATUS: NEW     PLAN:  PT FREQUENCY:  2x/week  PT DURATION: 12 weeks  PLANNED  INTERVENTIONS: 97164- PT Re-evaluation, 97750- Physical Performance Testing, 97110-Therapeutic exercises, 97530- Therapeutic activity, [id number]- Neuromuscular re-education, 97535- Self Care, [id number]- Manual therapy, [id number]- Gait training, [phone]- Aquatic Therapy, [phone]- Vasopneumatic device, [id number]- Ultrasound, and 97033- Ionotophoresis 4mg /ml Dexamethasone   PLAN FOR NEXT SESSION: Shoulder: progress post-op shoulder rehab as appropriate per protocol (biceps tenodesis protocol per op notes), evaluate and set goals for hamstring/LE pain when able. Pt reports he was told 15# lifting restriction.   Hamstring: work on lumbar mobility and gross hip mobility/flexibility, L LE and posterior chain strengthening. Manual as desired.  Consider heel lift     [place] BRAVO Marrissa Dai, PTA 09/20/2024, 11:13 AM    "

## 2024-09-24 ENCOUNTER — Encounter (HOSPITAL_BASED_OUTPATIENT_CLINIC_OR_DEPARTMENT_OTHER): Payer: Self-pay

## 2024-09-24 ENCOUNTER — Ambulatory Visit (HOSPITAL_BASED_OUTPATIENT_CLINIC_OR_DEPARTMENT_OTHER)

## 2024-09-24 DIAGNOSIS — M6281 Muscle weakness (generalized): Secondary | ICD-10-CM

## 2024-09-24 DIAGNOSIS — M25612 Stiffness of left shoulder, not elsewhere classified: Secondary | ICD-10-CM

## 2024-09-24 DIAGNOSIS — M79605 Pain in left leg: Secondary | ICD-10-CM

## 2024-09-24 DIAGNOSIS — M25512 Pain in left shoulder: Secondary | ICD-10-CM

## 2024-09-24 DIAGNOSIS — M25511 Pain in right shoulder: Secondary | ICD-10-CM

## 2024-09-24 NOTE — Therapy (Signed)
 " OUTPATIENT PHYSICAL THERAPY SHOULDER TREATMENT/HAMSTRING TREATMENT  Progress Note Reporting Period 08/14/24 to 09/24/24  See note below for Objective Data and Assessment of Progress/Goals.       Patient Name: Kyle Lawson MRN: [id number] DOB:04-May-1956, 69 y.o., male Today's Date: 09/24/2024  END OF SESSION:  PT End of Session - 09/24/24 0852     Visit Number 10    Number of Visits 17    Date for Recertification  11/06/24    Authorization Type MCR and BCBS    Authorization Time Period 08/14/24 to 11/07/23    Progress Note Due on Visit 10    PT Start Time 0848    PT Stop Time 0932    PT Time Calculation (min) 44 min    Activity Tolerance Patient tolerated treatment well    Behavior During Therapy [facility] for tasks assessed/performed                Past Medical History:  Diagnosis Date   Arthritis    Gout    History of kidney stones    Hyperlipidemia    Hypertension    Thoracic aortic aneurysm 2022   stable 4cm by CTA 12/2022   Past Surgical History:  Procedure Laterality Date   Anterior Cervical Decompression Fusion  2019   BICEPT TENODESIS Right 02/19/2024   Procedure: SUBPECTORAL BICEPS TENODESIS AND COLLAGEN PATCH AUGMENTATION;  Surgeon: Genelle Standing, MD;  Location: [facility];  Service: Orthopedics;  Laterality: Right;   DISTAL CLAVICLE EXCISION Right 2020   Right Shoulder Distal Clavicle Excision   JOINT REPLACEMENT  2008, 2013   Bilateral hip replacements   LAMINECTOMY  1984   L4-L5   LAMINECTOMY  1996   L3-L4   LUMBAR FUSION  01/11/2021   may 2022- L3-L5   LUMBAR FUSION  09/29/2021   L2-L3   SHOULDER ARTHROSCOPY WITH ROTATOR CUFF REPAIR Right 02/19/2024   Procedure: RIGHT SHOULDER ARTHROSCOPY;  Surgeon: Genelle Standing, MD;  Location: [facility];  Service: Orthopedics;  Laterality: Right;   SHOULDER ARTHROSCOPY WITH ROTATOR CUFF REPAIR Left 07/30/2024   Procedure: ARTHROSCOPY, SHOULDER, WITH ROTATOR CUFF REPAIR;   Surgeon: Genelle Standing, MD;  Location: [facility];  Service: Orthopedics;  Laterality: Left;  LEFT SHOULDER ARTHROSCOPY WITH ACROMIOPLASTY, BICEPS TENODESIS AND COLLAGEN PATCH AUGMENTATION   SPINE SURGERY  2019, 2022, 2023   C4-C6 fusion, L2-L5 fusion   TOTAL HIP ARTHROPLASTY Left    left hip 09/2006   TOTAL HIP ARTHROPLASTY  09/14/2011   Right. Procedure: TOTAL HIP ARTHROPLASTY;  Surgeon: Toribio JULIANNA Chancy, MD;  Location: [facility];  Service: Orthopedics;  Laterality: Right;   Patient Active Problem List   Diagnosis Date Noted   Nontraumatic incomplete tear of left rotator cuff 07/30/2024   Impingement syndrome of left shoulder 07/30/2024   Biceps tendinitis of left upper extremity 07/30/2024   Nontraumatic incomplete tear of right rotator cuff 02/19/2024   Fatty liver 05/20/2022   Essential hypertension 11/04/2021   Hyperlipidemia, unspecified 11/04/2021   Ascending aorta dilation 11/04/2021   Spinal stenosis of lumbar region 09/29/2021   HNP (herniated nucleus pulposus), lumbar 01/11/2021   Acromioclavicular joint pain 07/13/2019   Sternum pain 01/16/2019   Right anterior shoulder pain 11/08/2017   Neck pain 07/04/2017   Gout 02/05/2017   Lumbar spondylosis 01/19/2017   Acute bilateral low back pain with bilateral sciatica 01/19/2017    PCP: Katrinka Senior MD   REFERRING PROVIDER: Genelle Standing, MD for shoulder, [name],  DO for HS   REFERRING DIAG: Diagnosis M75.42 (ICD-10-CM) - Impingement syndrome of left shoulder Diagnosis  M76.892 (ICD-10-CM) - Hamstring tendinitis of left thigh  S76.312A (ICD-10-CM) - Strain of left hamstring muscle, initial encounter    THERAPY DIAG:  Stiffness of left shoulder, not elsewhere classified  Acute pain of left shoulder  Pain in left leg  Muscle weakness (generalized)  Acute pain of right shoulder  Rationale for Evaluation and Treatment: Rehabilitation  ONSET DATE: s/p L shoulder arthroscopy with rotator  cuff repair, SAD, biceps tenodesis 07/30/24  SUBJECTIVE:                                                                                                                                                                                      SUBJECTIVE STATEMENT: Pt reports mild improvement in L HS. I think the glute exercises are the key. Sleeping better with L shoulder, but still wakes him occasionally. Mild shoulder pain at entry.    Eval: Had this same surgery on the other side, saw [name] over at [facility] but schedules were busy and I hadn't been able get in to see her soon enough. First week after surgery was tough but its been feeling better, have been doing some basics. Surgeon took me out of the sling on Friday. Very active at baseline and looking forward to being able to get back into the gym. Doctor told me as 15# limit for lifting. Have been riding my bike a bit, saw Dr. Leonce and he diagnosed me with a badly sprained HS and gave me some exercises to try. HS has gotten better, took 3 weeks off from the bike and focused on walking instead of cycling/cut back on hiking.   Hand dominance: Right  PERTINENT HISTORY: See above   PAIN:  Are you having pain? L shoulder 1-2/10 more sore/achey than pain,  movement makes it better and nights make it worse  L HS 4/-5/10, sit bone and all distal attachment sites but nothing in the belly of the muscle itself; pain is aching more than sharp; exacerbated by stairs, getting out of chairs, overuse; feels better when exercise load is reduced, stretching can help for a short time   PRECAUTIONS: Shoulder  RED FLAGS: None   WEIGHT BEARING RESTRICTIONS: Yes NWB shoulder at eval   FALLS:  Has patient fallen in last 6 months? No  LIVING ENVIRONMENT: Lives with: lives with their family Lives in: House/apartment   OCCUPATION: Retired- [employer]   PLOF: Independent, Independent with basic ADLs, Independent with gait, and Independent with  transfers  PATIENT GOALS: get use of arm back, be able to play golf in the  spring, be able to exercise   NEXT MD VISIT:   OBJECTIVE:  Note: Objective measures were completed at Evaluation unless otherwise noted.    PATIENT SURVEYS:  PSFS: THE PATIENT SPECIFIC FUNCTIONAL SCALE  Place score of 0-10 (0 = unable to perform activity and 10 = able to perform activity at the same level as before injury or problem)  Activity Date: 08/14/24 1/20   Sleeping/finding good sleeping position 3 6   2. Getting non button shirts on/off 2 8   3. Reaching to open car door  1 8   4. Gym (upper body) 0 2   Total Score 1.5 6     Total Score = Sum of activity scores/number of activities  Minimally Detectable Change: 3 points (for single activity); 2 points (for average score)  [facility] (nd). The Patient Specific Functional Scale . Retrieved from [email or web address]   09/04/24 LEFS- 47/80    COGNITION: Overall cognitive status: Within functional limits for tasks assessed     SENSATION: Not tested  POSTURE: Rounded shoulders, forward head   UPPER EXTREMITY ROM:   ROM  Right eval Left Eval supine  Left 08/27/24 Left 09/02/24 Left 1/20  Shoulder flexion  PROM 110*, AAROM 110* PROM approximately 130*, AAROM 132* AROM 140* supine  AROM 145 supine  Shoulder extension       Shoulder abduction  PROM 90* PROM 140*, AAROM 136* AROM supine 141* AROM supine 150  Shoulder adduction       Shoulder internal rotation     60  Shoulder external rotation  PROM 30* by his side  PROM 30* by his side, AAROM approximately 40* seated  At 70* ABD, 35* ER  49 supine  Elbow flexion       Elbow extension       Wrist flexion       Wrist extension       Wrist ulnar deviation       Wrist radial deviation       Wrist pronation       Wrist supination       (Blank rows = not tested)  UPPER EXTREMITY MMT:  MMT Right eval Left eval   Shoulder flexion    Shoulder extension    Shoulder abduction    Shoulder adduction    Shoulder internal rotation    Shoulder external rotation    Middle trapezius    Lower trapezius    Elbow flexion    Elbow extension    Wrist flexion    Wrist extension    Wrist ulnar deviation    Wrist radial deviation    Wrist pronation    Wrist supination    Grip strength (lbs)    (Blank rows = not tested)  MMT not tested at eval   09/24/24  Quads: L 4+/5, R 5/5 Hams: L 4/5, R 5/5 Hip flexors: L 4+/5, R 5/5 Hip ABD:4+/5 B Hip extensors: L 4-/5, R 4-/5   09/04/24  Quads: L 4+/5, R 5/5 Hams: L 3-/5, R 4+/5 Hip flexors: L 4+/5, R 5/5 Hip ABD:4+/5 B Hip extensors: L 3/5, R 3+/5  Muscle flexibility-  quads 50% limited B, HS 80% limited B in 90/90 test, L piriformis >90% limited and R about 75% limited  Noted leg length difference with L LE about 1/2 inch longer   Palpation- tender at ischial tuberosity, some tenderness laterally/proximally, no trigger points noted muscle belly  TREATMENT DATE:   09/24/24 PROM L shoulder Updated ROM and MMT  Goal Reiview PFS update   09/20/24 PROM L shoulder Gentle GHJ mobilizations for flexion and abduction Supine flexion with 2# wate bar 2x10 Sidelying abduction x10 (challenging) Standing shoulder flexion x15  Active HS stretch 15 x6 Cook bridge 5 2x10bil Staggered STS 2x10ea   09/12/24 PROM L shoulder Supine wand press 3# Wate bar 2x10 Supine press L only with 2# x10 Sidelying ER 1# 2x10 Finger ladder x5 with 10 sec hold at top  Active HS stretch (review for HEP) Cook bridge 5 x10 S/l hip abduction 2x15L Supine Slr 2x10 HS isometric 5 x10  HEP update/review       PATIENT EDUCATION: Education details: exam findings, POC, HEP, biceps tenodesis precautions  Person educated: Patient Education  method: Programmer, Multimedia, Demonstration, and Handouts Education comprehension: verbalized understanding, returned demonstration, and needs further education  HOME EXERCISE PROGRAM:  Oked him for supine aarom shoulder flexion, rows and shoulder extension to neutral with yellow TB plus the following  Access Code: [email or web address] URL: [email or web address] Date: 09/02/2024 Prepared by: Josette Rough    Hamstring HEP:  Access Code: [email or web address] URL: [email or web address] Date: 09/04/2024 Prepared by: Josette Rough     ASSESSMENT:  CLINICAL IMPRESSION:   Pt has attended 10 visits of PT thus far and is making steady progress. He is 8 weeks s/p for L shoulder surgery. Subjective improvements include improved sleeping, improved ability to use arm for functional activities, and improved HS discomfort. He improved to 6/10 on PFS survey. Updated ROM and MMT reflect improvements in most areas. He remains tight into end range shoulder motions, though improved from last measured. Pt palpably tight into L teres major and lat area. Instructed in self release to this area to improve scapular mobility. Pt has met 2/5 STG and 1/10 LTG at this time. He will continue to benefit from PT to work towards functional strength and ROM in both L shoulder and L LE.    Eval: Patient is a 69 y.o. M who was seen today for physical therapy evaluation and treatment for skilled PT care following L shoulder arthroscopy with rotator cuff repair, SAD, biceps tenodesis 07/30/24. Of note he did have an additional order placed for HS strain, we were only able to address his post-op shoulder today however will plan on working on HS pain as time/reasonable efforts to recover from shoulder surgery allow.   OBJECTIVE IMPAIRMENTS: decreased ROM, decreased strength, increased fascial restrictions, impaired UE functional use, postural dysfunction, and pain.   ACTIVITY LIMITATIONS: carrying, lifting, sleeping,  bathing, toileting, dressing, reach over head, and hygiene/grooming  PARTICIPATION LIMITATIONS: meal prep, cleaning, laundry, driving, shopping, community activity, and yard work  PERSONAL FACTORS: Past/current experiences and Time since onset of injury/illness/exacerbation are also affecting patient's functional outcome.   REHAB POTENTIAL: Good  CLINICAL DECISION MAKING: Stable/uncomplicated  EVALUATION COMPLEXITY: Low   GOALS: Goals reviewed with patient? No  SHORT TERM GOALS: Target date: 09/25/2024    Patient will be compliant with appropriate progressive HEP        GOAL STATUS: MET 08/27/24  2. L shoulder flexion and ABD A/PROM to be at least 150*         GOAL STATUS: ONGOING 09/24/24   3. L shoulder IR and IR A/PROM to be at least 60* at 90* ABD           GOAL STATUS: ONGOING 09/24/24   4. Will be more aware of posture with all functional tasks  with use of ergonomic aides PRN/as desired      GOAL STATUS: ONGOING 09/24/24  5. Hamstring/LE pain PT evaluation to have been completed/appropriate goals written as time/shoulder surgery rehab allows GOAL STATUS: MET 09/04/24   LONG TERM GOALS: Target date: 11/06/2024   MMT to have improved by at least one grade in all weak groups       GOAL STATUS: IN PROGRESS 1/20   2. AROM to have normalized and will be pain free all planes of motion      GOAL STATUS: IN PROGRESS    3. Pain to be no more than 2/10 with all functional tasks L shoulder      GOAL STATUS: IN PROGRESS 1/20   4. Will be able to perform all functional household and work based tasks without increase from resting pain levels     GOAL STATUS: IN PROGRESS 1/20   5. PSFS to have improved by at least 3 points to show improved QOL and subjective perception of condition      GOAL STATUS: MET 1/20  6. Lower extremity LTGs to have been further established and addressed as time/rehab from shoulder surgery allows  GOAL STATUS: MET 09/04/24 as below  7. L LE MMT to  be 4+/5 without pain, R hip extensor strength also to be 4+/5 GOAL STATUS: IN PROGRESS 1/20  8. B HS and piriformis flexibility to be no more than 50% limited  GOAL STATUS: IN PROGRESS 1/20  9. Will be able to perform all functional mobility and transfers at home and in the community with L HS pain no more than 2/10 GOAL STATUS: IN PROGRESS 1/20  10. Will have been able to return to desired gym and exercise based activities with L HS pain no more than 2/10 GOAL STATUS:INITIA    PLAN:  PT FREQUENCY: 2x/week  PT DURATION: 12 weeks  PLANNED INTERVENTIONS: 97164- PT Re-evaluation, 97750- Physical Performance Testing, 97110-Therapeutic exercises, 97530- Therapeutic activity, [id number]- Neuromuscular re-education, 97535- Self Care, [id number]- Manual therapy, [id number]- Gait training, [id number]- Aquatic Therapy, 97016- Vasopneumatic device, [id number]- Ultrasound, and 02966- Ionotophoresis 4mg /ml Dexamethasone   PLAN FOR NEXT SESSION: Shoulder: progress post-op shoulder rehab as appropriate per protocol (biceps tenodesis protocol per op notes), evaluate and set goals for hamstring/LE pain when able. Pt reports he was told 15# lifting restriction.   Hamstring: work on lumbar mobility and gross hip mobility/flexibility, L LE and posterior chain strengthening. Manual as desired.  Consider heel lift     Asberry BRAVO Teren Zurcher, PTA 09/24/2024, 10:08 AM    "

## 2024-09-26 ENCOUNTER — Ambulatory Visit (HOSPITAL_BASED_OUTPATIENT_CLINIC_OR_DEPARTMENT_OTHER): Admitting: Physical Therapy

## 2024-09-26 ENCOUNTER — Encounter (HOSPITAL_BASED_OUTPATIENT_CLINIC_OR_DEPARTMENT_OTHER): Payer: Self-pay | Admitting: Physical Therapy

## 2024-09-26 DIAGNOSIS — M25612 Stiffness of left shoulder, not elsewhere classified: Secondary | ICD-10-CM

## 2024-09-26 DIAGNOSIS — M79605 Pain in left leg: Secondary | ICD-10-CM

## 2024-09-26 DIAGNOSIS — M6281 Muscle weakness (generalized): Secondary | ICD-10-CM

## 2024-09-26 NOTE — Therapy (Signed)
 " OUTPATIENT PHYSICAL THERAPY SHOULDER TREATMENT/HAMSTRING TREATMENT  Progress Note Reporting Period 08/14/24 to 09/24/24  See note below for Objective Data and Assessment of Progress/Goals.       Patient Name: Kyle Lawson MRN: [id number] DOB:Mar 12, 1956, 69 y.o., male Today's Date: 09/27/2024  END OF SESSION:  PT End of Session - 09/26/24 1355     Visit Number 11    Number of Visits 17    Date for Recertification  11/06/24    Authorization Type MCR and BCBS    PT Start Time 1316    PT Stop Time 1403    PT Time Calculation (min) 47 min    Activity Tolerance Patient tolerated treatment well    Behavior During Therapy [facility] for tasks assessed/performed                 Past Medical History:  Diagnosis Date   Arthritis    Gout    History of kidney stones    Hyperlipidemia    Hypertension    Thoracic aortic aneurysm 2022   stable 4cm by CTA 12/2022   Past Surgical History:  Procedure Laterality Date   Anterior Cervical Decompression Fusion  2019   BICEPT TENODESIS Right 02/19/2024   Procedure: SUBPECTORAL BICEPS TENODESIS AND COLLAGEN PATCH AUGMENTATION;  Surgeon: Genelle Standing, MD;  Location: [facility];  Service: Orthopedics;  Laterality: Right;   DISTAL CLAVICLE EXCISION Right 2020   Right Shoulder Distal Clavicle Excision   JOINT REPLACEMENT  2008, 2013   Bilateral hip replacements   LAMINECTOMY  1984   L4-L5   LAMINECTOMY  1996   L3-L4   LUMBAR FUSION  01/11/2021   may 2022- L3-L5   LUMBAR FUSION  09/29/2021   L2-L3   SHOULDER ARTHROSCOPY WITH ROTATOR CUFF REPAIR Right 02/19/2024   Procedure: RIGHT SHOULDER ARTHROSCOPY;  Surgeon: Genelle Standing, MD;  Location: [facility];  Service: Orthopedics;  Laterality: Right;   SHOULDER ARTHROSCOPY WITH ROTATOR CUFF REPAIR Left 07/30/2024   Procedure: ARTHROSCOPY, SHOULDER, WITH ROTATOR CUFF REPAIR;  Surgeon: Genelle Standing, MD;  Location: [facility];  Service:  Orthopedics;  Laterality: Left;  LEFT SHOULDER ARTHROSCOPY WITH ACROMIOPLASTY, BICEPS TENODESIS AND COLLAGEN PATCH AUGMENTATION   SPINE SURGERY  2019, 2022, 2023   C4-C6 fusion, L2-L5 fusion   TOTAL HIP ARTHROPLASTY Left    left hip 09/2006   TOTAL HIP ARTHROPLASTY  09/14/2011   Right. Procedure: TOTAL HIP ARTHROPLASTY;  Surgeon: Toribio JULIANNA Chancy, MD;  Location: [facility] OR;  Service: Orthopedics;  Laterality: Right;   Patient Active Problem List   Diagnosis Date Noted   Nontraumatic incomplete tear of left rotator cuff 07/30/2024   Impingement syndrome of left shoulder 07/30/2024   Biceps tendinitis of left upper extremity 07/30/2024   Nontraumatic incomplete tear of right rotator cuff 02/19/2024   Fatty liver 05/20/2022   Essential hypertension 11/04/2021   Hyperlipidemia, unspecified 11/04/2021   Ascending aorta dilation 11/04/2021   Spinal stenosis of lumbar region 09/29/2021   HNP (herniated nucleus pulposus), lumbar 01/11/2021   Acromioclavicular joint pain 07/13/2019   Sternum pain 01/16/2019   Right anterior shoulder pain 11/08/2017   Neck pain 07/04/2017   Gout 02/05/2017   Lumbar spondylosis 01/19/2017   Acute bilateral low back pain with bilateral sciatica 01/19/2017    PCP: Katrinka Senior MD   REFERRING PROVIDER: Genelle Standing, MD for shoulder, [name], DO for HS   REFERRING DIAG: Diagnosis M75.42 (ICD-10-CM) - Impingement syndrome of left shoulder Diagnosis  F23.107 (ICD-10-CM) - Hamstring tendinitis of left thigh  S76.312A (ICD-10-CM) - Strain of left hamstring muscle, initial encounter    THERAPY DIAG:  Stiffness of left shoulder, not elsewhere classified  Pain in left leg  Muscle weakness (generalized)  Rationale for Evaluation and Treatment: Rehabilitation  ONSET DATE: s/p L shoulder arthroscopy with rotator cuff repair, SAD, biceps tenodesis 07/30/24  SUBJECTIVE:                                                                                                                                                                                       SUBJECTIVE STATEMENT: Pt had setback in dealing with HS and said they experienced a cramp last night. Reports that shoulder is doing well.    Eval: Had this same surgery on the other side, saw [name] over at [facility] but schedules were busy and I hadn't been able get in to see her soon enough. First week after surgery was tough but its been feeling better, have been doing some basics. Surgeon took me out of the sling on Friday. Very active at baseline and looking forward to being able to get back into the gym. Doctor told me as 15# limit for lifting. Have been riding my bike a bit, saw Dr. Leonce and he diagnosed me with a badly sprained HS and gave me some exercises to try. HS has gotten better, took 3 weeks off from the bike and focused on walking instead of cycling/cut back on hiking.   Hand dominance: Right  PERTINENT HISTORY: See above   PAIN:  Are you having pain? L shoulder 1-2/10 more sore/achey than pain,  movement makes it better and nights make it worse  L HS 4/-5/10, sit bone and all distal attachment sites but nothing in the belly of the muscle itself; pain is aching more than sharp; exacerbated by stairs, getting out of chairs, overuse; feels better when exercise load is reduced, stretching can help for a short time   PRECAUTIONS: Shoulder  RED FLAGS: None   WEIGHT BEARING RESTRICTIONS: Yes NWB shoulder at eval   FALLS:  Has patient fallen in last 6 months? No  LIVING ENVIRONMENT: Lives with: lives with their family Lives in: House/apartment   OCCUPATION: Retired- [employer]   PLOF: Independent, Independent with basic ADLs, Independent with gait, and Independent with transfers  PATIENT GOALS: get use of arm back, be able to play golf in the spring, be able to exercise   NEXT MD VISIT:   OBJECTIVE:  Note: Objective measures were completed at Evaluation  unless otherwise noted.    PATIENT SURVEYS:  PSFS: THE PATIENT SPECIFIC FUNCTIONAL SCALE  Place score  of 0-10 (0 = unable to perform activity and 10 = able to perform activity at the same level as before injury or problem)  Activity Date: 08/14/24 1/20   Sleeping/finding good sleeping position 3 6   2. Getting non button shirts on/off 2 8   3. Reaching to open car door  1 8   4. Gym (upper body) 0 2   Total Score 1.5 6     Total Score = Sum of activity scores/number of activities  Minimally Detectable Change: 3 points (for single activity); 2 points (for average score)  [facility] (nd). The Patient Specific Functional Scale . Retrieved from [email or web address]   09/04/24 LEFS- 47/80    COGNITION: Overall cognitive status: Within functional limits for tasks assessed     SENSATION: Not tested  POSTURE: Rounded shoulders, forward head   UPPER EXTREMITY ROM:   ROM  Right eval Left Eval supine  Left 08/27/24 Left 09/02/24 Left 1/20  Shoulder flexion  PROM 110*, AAROM 110* PROM approximately 130*, AAROM 132* AROM 140* supine  AROM 145 supine  Shoulder extension       Shoulder abduction  PROM 90* PROM 140*, AAROM 136* AROM supine 141* AROM supine 150  Shoulder adduction       Shoulder internal rotation     60  Shoulder external rotation  PROM 30* by his side  PROM 30* by his side, AAROM approximately 40* seated  At 70* ABD, 35* ER  49 supine  Elbow flexion       Elbow extension       Wrist flexion       Wrist extension       Wrist ulnar deviation       Wrist radial deviation       Wrist pronation       Wrist supination       (Blank rows = not tested)  UPPER EXTREMITY MMT:  MMT Right eval Left eval  Shoulder flexion    Shoulder extension    Shoulder abduction    Shoulder adduction    Shoulder internal rotation    Shoulder external rotation    Middle trapezius    Lower trapezius    Elbow  flexion    Elbow extension    Wrist flexion    Wrist extension    Wrist ulnar deviation    Wrist radial deviation    Wrist pronation    Wrist supination    Grip strength (lbs)    (Blank rows = not tested)  MMT not tested at eval   09/24/24  Quads: L 4+/5, R 5/5 Hams: L 4/5, R 5/5 Hip flexors: L 4+/5, R 5/5 Hip ABD:4+/5 B Hip extensors: L 4-/5, R 4-/5   09/04/24  Quads: L 4+/5, R 5/5 Hams: L 3-/5, R 4+/5 Hip flexors: L 4+/5, R 5/5 Hip ABD:4+/5 B Hip extensors: L 3/5, R 3+/5  Muscle flexibility-  quads 50% limited B, HS 80% limited B in 90/90 test, L piriformis >90% limited and R about 75% limited  Noted leg length difference with L LE about 1/2 inch longer   Palpation- tender at ischial tuberosity, some tenderness laterally/proximally, no trigger points noted muscle belly  TREATMENT DATE:  1/22 Manual: Skilled palpation of TP in hamstring PROM flexion and pec release   Trigger Point Dry Needling  Initial Treatment: Pt instructed on Dry Needling rational, procedures, and possible side effects. Pt instructed to expect mild to moderate muscle soreness later in the day and/or into the next day.  Pt instructed in methods to reduce muscle soreness. Pt instructed to continue prescribed HEP. Because Dry Needling was performed over or adjacent to a lung field, pt was educated on S/S of pneumothorax and to seek immediate medical attention should they occur.  Patient was educated on signs and symptoms of infection and other risk factors and advised to seek medical attention should they occur.  Patient verbalized understanding of these instructions and education.   Patient Verbal Consent Given: Yes Education Handout Provided: Previously Provided Muscles Treated: lateral hamstring 4x Electrical Stimulation Performed: No Treatment Response/Outcome:  great twitch    Manual:  Shoulder AAROM with pulley  TP STM to trap  Inf/post mob of GH jt 3&4  There-ex: Yoga ball HS presses Straight leg  and bent leg3x10  Yoga ball HS pull-in 3x10   09/24/24 PROM L shoulder Updated ROM and MMT  Goal Reiview PFS update   09/20/24 PROM L shoulder Gentle GHJ mobilizations for flexion and abduction Supine flexion with 2# wate bar 2x10 Sidelying abduction x10 (challenging) Standing shoulder flexion x15  Active HS stretch 15 x6 Cook bridge 5 2x10bil Staggered STS 2x10ea   09/12/24 PROM L shoulder Supine wand press 3# Wate bar 2x10 Supine press L only with 2# x10 Sidelying ER 1# 2x10 Finger ladder x5 with 10 sec hold at top  Active HS stretch (review for HEP) Cook bridge 5 x10 S/l hip abduction 2x15L Supine Slr 2x10 HS isometric 5 x10  HEP update/review       PATIENT EDUCATION: Education details: exam findings, POC, HEP, biceps tenodesis precautions  Person educated: Patient Education method: Programmer, Multimedia, Demonstration, and Handouts Education comprehension: verbalized understanding, returned demonstration, and needs further education  HOME EXERCISE PROGRAM:  Oked him for supine aarom shoulder flexion, rows and shoulder extension to neutral with yellow TB plus the following  Access Code: [email or web address] URL: [email or web address] Date: 09/02/2024 Prepared by: Josette Rough    Hamstring HEP:  Access Code: [email or web address] URL: [email or web address] Date: 09/04/2024 Prepared by: Josette Rough     ASSESSMENT:  CLINICAL IMPRESSION:   Pt came in today with complaints of cramping in his HS. Palpation revealed a HS TP and pt was DN, produced a great twitch. PT provided light HS exercise to try and get leg moving while also preventing further cramping. PT also addressed shoulder and provided STM, Mobilizations, and PROM to loosen joint. Pt will benefit from PT intervention to assist with shoulder and  hamstring management.    Eval: Patient is a 69 y.o. M who was seen today for physical therapy evaluation and treatment for skilled PT care following L shoulder arthroscopy with rotator cuff repair, SAD, biceps tenodesis 07/30/24. Of note he did have an additional order placed for HS strain, we were only able to address his post-op shoulder today however will plan on working on HS pain as time/reasonable efforts to recover from shoulder surgery allow.   OBJECTIVE IMPAIRMENTS: decreased ROM, decreased strength, increased fascial restrictions, impaired UE functional use, postural dysfunction, and pain.   ACTIVITY LIMITATIONS: carrying, lifting, sleeping, bathing, toileting, dressing, reach over head, and hygiene/grooming  PARTICIPATION LIMITATIONS: meal prep, cleaning, laundry, driving, shopping, community activity, and yard work  PERSONAL  FACTORS: Past/current experiences and Time since onset of injury/illness/exacerbation are also affecting patient's functional outcome.   REHAB POTENTIAL: Good  CLINICAL DECISION MAKING: Stable/uncomplicated  EVALUATION COMPLEXITY: Low   GOALS: Goals reviewed with patient? No  SHORT TERM GOALS: Target date: 09/25/2024    Patient will be compliant with appropriate progressive HEP        GOAL STATUS: MET 08/27/24  2. L shoulder flexion and ABD A/PROM to be at least 150*         GOAL STATUS: ONGOING 09/24/24   3. L shoulder IR and IR A/PROM to be at least 60* at 90* ABD           GOAL STATUS: ONGOING 09/24/24   4. Will be more aware of posture with all functional tasks with use of ergonomic aides PRN/as desired      GOAL STATUS: ONGOING 09/24/24  5. Hamstring/LE pain PT evaluation to have been completed/appropriate goals written as time/shoulder surgery rehab allows GOAL STATUS: MET 09/04/24   LONG TERM GOALS: Target date: 11/06/2024   MMT to have improved by at least one grade in all weak groups       GOAL STATUS: IN PROGRESS 1/20   2. AROM  to have normalized and will be pain free all planes of motion      GOAL STATUS: IN PROGRESS    3. Pain to be no more than 2/10 with all functional tasks L shoulder      GOAL STATUS: IN PROGRESS 1/20   4. Will be able to perform all functional household and work based tasks without increase from resting pain levels     GOAL STATUS: IN PROGRESS 1/20   5. PSFS to have improved by at least 3 points to show improved QOL and subjective perception of condition      GOAL STATUS: MET 1/20  6. Lower extremity LTGs to have been further established and addressed as time/rehab from shoulder surgery allows  GOAL STATUS: MET 09/04/24 as below  7. L LE MMT to be 4+/5 without pain, R hip extensor strength also to be 4+/5 GOAL STATUS: IN PROGRESS 1/20  8. B HS and piriformis flexibility to be no more than 50% limited  GOAL STATUS: IN PROGRESS 1/20  9. Will be able to perform all functional mobility and transfers at home and in the community with L HS pain no more than 2/10 GOAL STATUS: IN PROGRESS 1/20  10. Will have been able to return to desired gym and exercise based activities with L HS pain no more than 2/10 GOAL STATUS:INITIA    PLAN:  PT FREQUENCY: 2x/week  PT DURATION: 12 weeks  PLANNED INTERVENTIONS: 97164- PT Re-evaluation, 97750- Physical Performance Testing, 97110-Therapeutic exercises, 97530- Therapeutic activity, [id number]- Neuromuscular re-education, 97535- Self Care, [id number]- Manual therapy, [id number]- Gait training, [id number]- Aquatic Therapy, 97016- Vasopneumatic device, [id number]- Ultrasound, and 97033- Ionotophoresis 4mg /ml Dexamethasone   PLAN FOR NEXT SESSION: Shoulder: progress post-op shoulder rehab as appropriate per protocol (biceps tenodesis protocol per op notes), evaluate and set goals for hamstring/LE pain when able. Pt reports he was told 15# lifting restriction.   Hamstring: work on lumbar mobility and gross hip mobility/flexibility, L LE and posterior chain strengthening. Manual  as desired.  Consider heel lift     Alm JINNY Don, PT 09/27/2024, 11:23 AM "

## 2024-09-27 ENCOUNTER — Encounter (HOSPITAL_BASED_OUTPATIENT_CLINIC_OR_DEPARTMENT_OTHER): Payer: Self-pay | Admitting: Physical Therapy

## 2024-09-30 ENCOUNTER — Encounter (HOSPITAL_BASED_OUTPATIENT_CLINIC_OR_DEPARTMENT_OTHER)

## 2024-10-03 ENCOUNTER — Ambulatory Visit (HOSPITAL_BASED_OUTPATIENT_CLINIC_OR_DEPARTMENT_OTHER)

## 2024-10-03 ENCOUNTER — Encounter (HOSPITAL_BASED_OUTPATIENT_CLINIC_OR_DEPARTMENT_OTHER): Payer: Self-pay

## 2024-10-03 ENCOUNTER — Other Ambulatory Visit (HOSPITAL_BASED_OUTPATIENT_CLINIC_OR_DEPARTMENT_OTHER): Payer: Self-pay

## 2024-10-03 DIAGNOSIS — M79605 Pain in left leg: Secondary | ICD-10-CM

## 2024-10-03 DIAGNOSIS — M25612 Stiffness of left shoulder, not elsewhere classified: Secondary | ICD-10-CM

## 2024-10-03 DIAGNOSIS — M25511 Pain in right shoulder: Secondary | ICD-10-CM

## 2024-10-03 DIAGNOSIS — M25512 Pain in left shoulder: Secondary | ICD-10-CM

## 2024-10-03 DIAGNOSIS — M6281 Muscle weakness (generalized): Secondary | ICD-10-CM

## 2024-10-03 NOTE — Therapy (Signed)
 " OUTPATIENT PHYSICAL THERAPY SHOULDER TREATMENT/HAMSTRING TREATMENT       Patient Name: Kyle Lawson MRN: [id number] DOB:[birth year], 69 y.o., male Today's Date: 10/03/2024  END OF SESSION:  PT End of Session - 10/03/24 1018     Visit Number 12    Number of Visits 17    Date for Recertification  11/06/24    Authorization Type MCR and BCBS    Authorization Time Period 08/14/24 to 11/07/23    Progress Note Due on Visit 10    PT Start Time 1017    PT Stop Time 1105    PT Time Calculation (min) 48 min    Activity Tolerance Patient tolerated treatment well    Behavior During Therapy [facility] for tasks assessed/performed                  Past Medical History:  Diagnosis Date   Arthritis    Gout    History of kidney stones    Hyperlipidemia    Hypertension    Thoracic aortic aneurysm 2022   stable 4cm by CTA 12/2022   Past Surgical History:  Procedure Laterality Date   Anterior Cervical Decompression Fusion  2019   BICEPT TENODESIS Right 02/19/2024   Procedure: SUBPECTORAL BICEPS TENODESIS AND COLLAGEN PATCH AUGMENTATION;  Surgeon: Genelle Standing, MD;  Location: [facility];  Service: Orthopedics;  Laterality: Right;   DISTAL CLAVICLE EXCISION Right 2020   Right Shoulder Distal Clavicle Excision   JOINT REPLACEMENT  2008, 2013   Bilateral hip replacements   LAMINECTOMY  1984   L4-L5   LAMINECTOMY  1996   L3-L4   LUMBAR FUSION  01/11/2021   may 2022- L3-L5   LUMBAR FUSION  09/29/2021   L2-L3   SHOULDER ARTHROSCOPY WITH ROTATOR CUFF REPAIR Right 02/19/2024   Procedure: RIGHT SHOULDER ARTHROSCOPY;  Surgeon: Genelle Standing, MD;  Location: [facility];  Service: Orthopedics;  Laterality: Right;   SHOULDER ARTHROSCOPY WITH ROTATOR CUFF REPAIR Left 07/30/2024   Procedure: ARTHROSCOPY, SHOULDER, WITH ROTATOR CUFF REPAIR;  Surgeon: Genelle Standing, MD;  Location:  SURGERY CENTER;  Service: Orthopedics;  Laterality: Left;  LEFT SHOULDER  ARTHROSCOPY WITH ACROMIOPLASTY, BICEPS TENODESIS AND COLLAGEN PATCH AUGMENTATION   SPINE SURGERY  2019, 2022, 2023   C4-C6 fusion, L2-L5 fusion   TOTAL HIP ARTHROPLASTY Left    left hip 09/2006   TOTAL HIP ARTHROPLASTY  09/14/2011   Right. Procedure: TOTAL HIP ARTHROPLASTY;  Surgeon: Toribio JULIANNA Chancy, MD;  Location: [facility] OR;  Service: Orthopedics;  Laterality: Right;   Patient Active Problem List   Diagnosis Date Noted   Nontraumatic incomplete tear of left rotator cuff 07/30/2024   Impingement syndrome of left shoulder 07/30/2024   Biceps tendinitis of left upper extremity 07/30/2024   Nontraumatic incomplete tear of right rotator cuff 02/19/2024   Fatty liver 05/20/2022   Essential hypertension 11/04/2021   Hyperlipidemia, unspecified 11/04/2021   Ascending aorta dilation 11/04/2021   Spinal stenosis of lumbar region 09/29/2021   HNP (herniated nucleus pulposus), lumbar 01/11/2021   Acromioclavicular joint pain 07/13/2019   Sternum pain 01/16/2019   Right anterior shoulder pain 11/08/2017   Neck pain 07/04/2017   Gout 02/05/2017   Lumbar spondylosis 01/19/2017   Acute bilateral low back pain with bilateral sciatica 01/19/2017    PCP: Katrinka Senior MD   REFERRING PROVIDER: Genelle Standing, MD for shoulder, [name], DO for HS   REFERRING DIAG: Diagnosis M75.42 (ICD-10-CM) - Impingement syndrome of left shoulder Diagnosis  F23.107 (ICD-10-CM) - Hamstring tendinitis of left thigh  S76.312A (ICD-10-CM) - Strain of left hamstring muscle, initial encounter    THERAPY DIAG:  Stiffness of left shoulder, not elsewhere classified  Pain in left leg  Muscle weakness (generalized)  Acute pain of left shoulder  Acute pain of right shoulder  Rationale for Evaluation and Treatment: Rehabilitation  ONSET DATE: s/p L shoulder arthroscopy with rotator cuff repair, SAD, biceps tenodesis 07/30/24  SUBJECTIVE:                                                                                                                                                                                       SUBJECTIVE STATEMENT: Shoulder is a little sore today. Has been using band at home. States the needling helped slightly in HS.     Eval: Had this same surgery on the other side, saw [name] over at [facility] but schedules were busy and I hadn't been able get in to see her soon enough. First week after surgery was tough but its been feeling better, have been doing some basics. Surgeon took me out of the sling on Friday. Very active at baseline and looking forward to being able to get back into the gym. Doctor told me as 15# limit for lifting. Have been riding my bike a bit, saw Dr. Leonce and he diagnosed me with a badly sprained HS and gave me some exercises to try. HS has gotten better, took 3 weeks off from the bike and focused on walking instead of cycling/cut back on hiking.   Hand dominance: Right  PERTINENT HISTORY: See above   PAIN:  Are you having pain? L shoulder 1-2/10 more sore/achey than pain,  movement makes it better and nights make it worse  L HS 4/-5/10, sit bone and all distal attachment sites but nothing in the belly of the muscle itself; pain is aching more than sharp; exacerbated by stairs, getting out of chairs, overuse; feels better when exercise load is reduced, stretching can help for a short time   PRECAUTIONS: Shoulder  RED FLAGS: None   WEIGHT BEARING RESTRICTIONS: Yes NWB shoulder at eval   FALLS:  Has patient fallen in last 6 months? No  LIVING ENVIRONMENT: Lives with: lives with their family Lives in: House/apartment   OCCUPATION: Retired- [employer]   PLOF: Independent, Independent with basic ADLs, Independent with gait, and Independent with transfers  PATIENT GOALS: get use of arm back, be able to play golf in the spring, be able to exercise   NEXT MD VISIT:   OBJECTIVE:  Note: Objective measures were completed at Evaluation  unless otherwise noted.    PATIENT  SURVEYS:  PSFS: THE PATIENT SPECIFIC FUNCTIONAL SCALE  Place score of 0-10 (0 = unable to perform activity and 10 = able to perform activity at the same level as before injury or problem)  Activity Date: 08/14/24 1/20   Sleeping/finding good sleeping position 3 6   2. Getting non button shirts on/off 2 8   3. Reaching to open car door  1 8   4. Gym (upper body) 0 2   Total Score 1.5 6     Total Score = Sum of activity scores/number of activities  Minimally Detectable Change: 3 points (for single activity); 2 points (for average score)  [facility] (nd). The Patient Specific Functional Scale . Retrieved from [email or web address]   09/04/24 LEFS- 47/80    COGNITION: Overall cognitive status: Within functional limits for tasks assessed     SENSATION: Not tested  POSTURE: Rounded shoulders, forward head   UPPER EXTREMITY ROM:   ROM  Right eval Left Eval supine  Left 08/27/24 Left 09/02/24 Left 1/20  Shoulder flexion  PROM 110*, AAROM 110* PROM approximately 130*, AAROM 132* AROM 140* supine  AROM 145 supine  Shoulder extension       Shoulder abduction  PROM 90* PROM 140*, AAROM 136* AROM supine 141* AROM supine 150  Shoulder adduction       Shoulder internal rotation     60  Shoulder external rotation  PROM 30* by his side  PROM 30* by his side, AAROM approximately 40* seated  At 70* ABD, 35* ER  49 supine  Elbow flexion       Elbow extension       Wrist flexion       Wrist extension       Wrist ulnar deviation       Wrist radial deviation       Wrist pronation       Wrist supination       (Blank rows = not tested)  UPPER EXTREMITY MMT:  MMT Right eval Left eval  Shoulder flexion    Shoulder extension    Shoulder abduction    Shoulder adduction    Shoulder internal rotation    Shoulder external rotation    Middle trapezius    Lower trapezius    Elbow  flexion    Elbow extension    Wrist flexion    Wrist extension    Wrist ulnar deviation    Wrist radial deviation    Wrist pronation    Wrist supination    Grip strength (lbs)    (Blank rows = not tested)  MMT not tested at eval   09/24/24  Quads: L 4+/5, R 5/5 Hams: L 4/5, R 5/5 Hip flexors: L 4+/5, R 5/5 Hip ABD:4+/5 B Hip extensors: L 4-/5, R 4-/5   09/04/24  Quads: L 4+/5, R 5/5 Hams: L 3-/5, R 4+/5 Hip flexors: L 4+/5, R 5/5 Hip ABD:4+/5 B Hip extensors: L 3/5, R 3+/5  Muscle flexibility-  quads 50% limited B, HS 80% limited B in 90/90 test, L piriformis >90% limited and R about 75% limited  Noted leg length difference with L LE about 1/2 inch longer   Palpation- tender at ischial tuberosity, some tenderness laterally/proximally, no trigger points noted muscle belly  TREATMENT DATE:   10/03/24 Pulleys flexion/abduction  PROM L shoulder Supine active flexion 2x10 Bridges 2x10 neutral and with legs extended for more HS engagement S/l abduction ant/post taps 2x15ea Staggered STS 2x10ea Standing HS stretching- foot on plinth    1/22 Manual: Skilled palpation of TP in hamstring PROM flexion and pec release   Trigger Point Dry Needling  Initial Treatment: Pt instructed on Dry Needling rational, procedures, and possible side effects. Pt instructed to expect mild to moderate muscle soreness later in the day and/or into the next day.  Pt instructed in methods to reduce muscle soreness. Pt instructed to continue prescribed HEP. Because Dry Needling was performed over or adjacent to a lung field, pt was educated on S/S of pneumothorax and to seek immediate medical attention should they occur.  Patient was educated on signs and symptoms of infection and other risk factors and advised to seek medical attention should they occur.  Patient  verbalized understanding of these instructions and education.   Patient Verbal Consent Given: Yes Education Handout Provided: Previously Provided Muscles Treated: lateral hamstring 4x Electrical Stimulation Performed: No Treatment Response/Outcome: great twitch    Manual:  Shoulder AAROM with pulley  TP STM to trap  Inf/post mob of GH jt 3&4  There-ex: Yoga ball HS presses Straight leg  and bent leg3x10  Yoga ball HS pull-in 3x10   09/24/24 PROM L shoulder Updated ROM and MMT  Goal Reiview PFS update   09/20/24 PROM L shoulder Gentle GHJ mobilizations for flexion and abduction Supine flexion with 2# wate bar 2x10 Sidelying abduction x10 (challenging) Standing shoulder flexion x15  Active HS stretch 15 x6 Cook bridge 5 2x10bil Staggered STS 2x10ea   09/12/24 PROM L shoulder Supine wand press 3# Wate bar 2x10 Supine press L only with 2# x10 Sidelying ER 1# 2x10 Finger ladder x5 with 10 sec hold at top  Active HS stretch (review for HEP) Cook bridge 5 x10 S/l hip abduction 2x15L Supine Slr 2x10 HS isometric 5 x10  HEP update/review       PATIENT EDUCATION: Education details: exam findings, POC, HEP, biceps tenodesis precautions  Person educated: Patient Education method: Programmer, Multimedia, Demonstration, and Handouts Education comprehension: verbalized understanding, returned demonstration, and needs further education  HOME EXERCISE PROGRAM:  Oked him for supine aarom shoulder flexion, rows and shoulder extension to neutral with yellow TB plus the following  Access Code: [email or web address] URL: [email or web address] Date: 09/02/2024 Prepared by: Josette Rough    Hamstring HEP:  Access Code: [email or web address] URL: [email or web address] Date: 09/04/2024 Prepared by: Josette Rough     ASSESSMENT:  CLINICAL IMPRESSION:   Continued to work on shoulder ROM and strength in addition to HS mobility. He had increased pain with s/l shoulder  abduction today, so discontinued. Pt reports he may have flared his shoulder up after shoveling ice. Instructed him to rest his shoulder for a couple days, but continue to work on ROM exercises as tolerated. He denied HS pain with LE strengthening.    Eval: Patient is a 69 y.o. M who was seen today for physical therapy evaluation and treatment for skilled PT care following L shoulder arthroscopy with rotator cuff repair, SAD, biceps tenodesis 07/30/24. Of note he did have an additional order placed for HS strain, we were only able to address his post-op shoulder today however will plan on working on HS pain as time/reasonable efforts to recover from shoulder surgery allow.   OBJECTIVE IMPAIRMENTS: decreased ROM, decreased strength, increased fascial restrictions, impaired UE functional  use, postural dysfunction, and pain.   ACTIVITY LIMITATIONS: carrying, lifting, sleeping, bathing, toileting, dressing, reach over head, and hygiene/grooming  PARTICIPATION LIMITATIONS: meal prep, cleaning, laundry, driving, shopping, community activity, and yard work  PERSONAL FACTORS: Past/current experiences and Time since onset of injury/illness/exacerbation are also affecting patient's functional outcome.   REHAB POTENTIAL: Good  CLINICAL DECISION MAKING: Stable/uncomplicated  EVALUATION COMPLEXITY: Low   GOALS: Goals reviewed with patient? No  SHORT TERM GOALS: Target date: 09/25/2024    Patient will be compliant with appropriate progressive HEP        GOAL STATUS: MET 08/27/24  2. L shoulder flexion and ABD A/PROM to be at least 150*         GOAL STATUS: ONGOING 09/24/24   3. L shoulder IR and IR A/PROM to be at least 60* at 90* ABD           GOAL STATUS: ONGOING 09/24/24   4. Will be more aware of posture with all functional tasks with use of ergonomic aides PRN/as desired      GOAL STATUS: ONGOING 09/24/24  5. Hamstring/LE pain PT evaluation to have been completed/appropriate goals written  as time/shoulder surgery rehab allows GOAL STATUS: MET 09/04/24   LONG TERM GOALS: Target date: 11/06/2024   MMT to have improved by at least one grade in all weak groups       GOAL STATUS: IN PROGRESS 1/20   2. AROM to have normalized and will be pain free all planes of motion      GOAL STATUS: IN PROGRESS    3. Pain to be no more than 2/10 with all functional tasks L shoulder      GOAL STATUS: IN PROGRESS 1/20   4. Will be able to perform all functional household and work based tasks without increase from resting pain levels     GOAL STATUS: IN PROGRESS 1/20   5. PSFS to have improved by at least 3 points to show improved QOL and subjective perception of condition      GOAL STATUS: MET 1/20  6. Lower extremity LTGs to have been further established and addressed as time/rehab from shoulder surgery allows  GOAL STATUS: MET 09/04/24 as below  7. L LE MMT to be 4+/5 without pain, R hip extensor strength also to be 4+/5 GOAL STATUS: IN PROGRESS 1/20  8. B HS and piriformis flexibility to be no more than 50% limited  GOAL STATUS: IN PROGRESS 1/20  9. Will be able to perform all functional mobility and transfers at home and in the community with L HS pain no more than 2/10 GOAL STATUS: IN PROGRESS 1/20  10. Will have been able to return to desired gym and exercise based activities with L HS pain no more than 2/10 GOAL STATUS:INITIA    PLAN:  PT FREQUENCY: 2x/week  PT DURATION: 12 weeks  PLANNED INTERVENTIONS: 97164- PT Re-evaluation, 97750- Physical Performance Testing, 97110-Therapeutic exercises, 97530- Therapeutic activity, [id number]- Neuromuscular re-education, 97535- Self Care, [id number]- Manual therapy, [id number]- Gait training, [id number]- Aquatic Therapy, 97016- Vasopneumatic device, [id number]- Ultrasound, and 02966- Ionotophoresis 4mg /ml Dexamethasone   PLAN FOR NEXT SESSION: Shoulder: progress post-op shoulder rehab as appropriate per protocol (biceps tenodesis protocol per op notes),  evaluate and set goals for hamstring/LE pain when able. Pt reports he was told 15# lifting restriction.   Hamstring: work on lumbar mobility and gross hip mobility/flexibility, L LE and posterior chain strengthening. Manual as desired.  Consider heel lift     Kyle Lawson  Kyle Lawson, PTA 10/03/2024, 12:10 PM "

## 2024-10-05 ENCOUNTER — Encounter (HOSPITAL_BASED_OUTPATIENT_CLINIC_OR_DEPARTMENT_OTHER): Payer: Self-pay | Admitting: Physical Therapy

## 2024-10-07 ENCOUNTER — Ambulatory Visit: Admitting: Sports Medicine

## 2024-10-07 ENCOUNTER — Ambulatory Visit (HOSPITAL_BASED_OUTPATIENT_CLINIC_OR_DEPARTMENT_OTHER)

## 2024-10-10 ENCOUNTER — Encounter (HOSPITAL_BASED_OUTPATIENT_CLINIC_OR_DEPARTMENT_OTHER)

## 2024-10-14 ENCOUNTER — Encounter (HOSPITAL_BASED_OUTPATIENT_CLINIC_OR_DEPARTMENT_OTHER): Admitting: Physical Therapy

## 2024-10-17 ENCOUNTER — Encounter (HOSPITAL_BASED_OUTPATIENT_CLINIC_OR_DEPARTMENT_OTHER): Admitting: Physical Therapy

## 2024-10-22 ENCOUNTER — Ambulatory Visit: Admitting: Sports Medicine

## 2024-10-30 ENCOUNTER — Encounter (HOSPITAL_BASED_OUTPATIENT_CLINIC_OR_DEPARTMENT_OTHER): Admitting: Physical Therapy

## 2024-10-30 ENCOUNTER — Encounter (HOSPITAL_BASED_OUTPATIENT_CLINIC_OR_DEPARTMENT_OTHER): Admitting: Orthopaedic Surgery

## 2024-11-05 ENCOUNTER — Encounter (HOSPITAL_BASED_OUTPATIENT_CLINIC_OR_DEPARTMENT_OTHER): Admitting: Physical Therapy

## 2024-11-07 ENCOUNTER — Ambulatory Visit: Admitting: Orthopaedic Surgery

## 2025-05-27 ENCOUNTER — Encounter: Admitting: Family Medicine

## 2025-08-25 ENCOUNTER — Ambulatory Visit

## 2025-08-26 ENCOUNTER — Ambulatory Visit
